# Patient Record
Sex: Male | Born: 1950
Health system: Southern US, Community
[De-identification: ages and names within clinical notes are randomized; demographics above are authoritative.]

## PROBLEM LIST (undated history)

## (undated) DIAGNOSIS — G20A1 Parkinson's disease without dyskinesia, without mention of fluctuations: Secondary | ICD-10-CM

## (undated) DIAGNOSIS — K862 Cyst of pancreas: Secondary | ICD-10-CM

## (undated) DIAGNOSIS — F429 Obsessive-compulsive disorder, unspecified: Secondary | ICD-10-CM

## (undated) DIAGNOSIS — I2510 Atherosclerotic heart disease of native coronary artery without angina pectoris: Secondary | ICD-10-CM

## (undated) DIAGNOSIS — E7800 Pure hypercholesterolemia, unspecified: Secondary | ICD-10-CM

## (undated) DIAGNOSIS — F419 Anxiety disorder, unspecified: Secondary | ICD-10-CM

## (undated) DIAGNOSIS — F32A Depression, unspecified: Secondary | ICD-10-CM

## (undated) DIAGNOSIS — K469 Unspecified abdominal hernia without obstruction or gangrene: Secondary | ICD-10-CM

## (undated) DIAGNOSIS — K219 Gastro-esophageal reflux disease without esophagitis: Secondary | ICD-10-CM

## (undated) DIAGNOSIS — I10 Essential (primary) hypertension: Secondary | ICD-10-CM

## (undated) DIAGNOSIS — M1990 Unspecified osteoarthritis, unspecified site: Secondary | ICD-10-CM

## (undated) HISTORY — DX: Depression, unspecified: F32.A

## (undated) HISTORY — DX: Essential (primary) hypertension: I10

## (undated) HISTORY — DX: Pure hypercholesterolemia, unspecified: E78.00

## (undated) HISTORY — DX: Anxiety disorder, unspecified: F41.9

## (undated) HISTORY — PX: HERNIA REPAIR: SHX51

## (undated) HISTORY — DX: Atherosclerotic heart disease of native coronary artery without angina pectoris: I25.10

## (undated) HISTORY — PX: COLONOSCOPY: SHX174

---

## 2003-05-28 ENCOUNTER — Emergency Department (HOSPITAL_COMMUNITY): Admission: EM | Admit: 2003-05-28 | Discharge: 2003-05-28 | Payer: Self-pay | Admitting: Emergency Medicine

## 2003-05-29 ENCOUNTER — Inpatient Hospital Stay (HOSPITAL_COMMUNITY): Admission: EM | Admit: 2003-05-29 | Discharge: 2003-05-31 | Payer: Self-pay | Admitting: Emergency Medicine

## 2003-05-30 ENCOUNTER — Encounter: Payer: Self-pay | Admitting: Family Medicine

## 2003-05-30 ENCOUNTER — Encounter: Payer: Self-pay | Admitting: Internal Medicine

## 2003-05-31 ENCOUNTER — Ambulatory Visit (HOSPITAL_COMMUNITY): Admission: RE | Admit: 2003-05-31 | Discharge: 2003-05-31 | Payer: Self-pay | Admitting: Family Medicine

## 2007-12-28 HISTORY — PX: OTHER SURGICAL HISTORY: SHX169

## 2010-02-05 ENCOUNTER — Encounter: Payer: Self-pay | Admitting: Gastroenterology

## 2010-02-13 ENCOUNTER — Ambulatory Visit: Payer: Self-pay | Admitting: Gastroenterology

## 2010-02-13 ENCOUNTER — Ambulatory Visit (HOSPITAL_COMMUNITY): Admission: RE | Admit: 2010-02-13 | Discharge: 2010-02-13 | Payer: Self-pay | Admitting: Gastroenterology

## 2010-03-23 DIAGNOSIS — I2510 Atherosclerotic heart disease of native coronary artery without angina pectoris: Secondary | ICD-10-CM | POA: Insufficient documentation

## 2010-03-23 DIAGNOSIS — I25119 Atherosclerotic heart disease of native coronary artery with unspecified angina pectoris: Secondary | ICD-10-CM | POA: Insufficient documentation

## 2011-01-26 NOTE — Letter (Signed)
Summary: Internal Other Domingo Dimes  Internal Other Domingo Dimes   Imported By: Cloria Spring LPN 57/84/6962 95:28:41  _____________________________________________________________________  External Attachment:    Type:   Image     Comment:   External Document

## 2011-05-14 NOTE — Consult Note (Signed)
Devin Daugherty, Devin Daugherty                            ACCOUNT NO.:  [id number]   MEDICAL RECORD NO.:  [id number]                   PATIENT TYPE:  EMS   LOCATION:  ED                                   FACILITY:  [facility]   PHYSICIAN:  Gracelyn Nurse, M.D.              DATE OF BIRTH:  1951/09/22   DATE OF CONSULTATION:  DATE OF DISCHARGE:                                   CONSULTATION   REASON FOR CONSULTATION:  Abdominal pain and depression.   HISTORY OF PRESENT ILLNESS:  This is a 60 year old white male with a history  of major depression and benzodiazepines addiction who was recently  discharged from [facility] after being detoxed off of  benzodiazepines for the second time.  His wife says he has been complaining  of one week history of epigastric pain.  She says he has had this pain  worked up before in the past very extensively but no organic cause was ever  found.  He has had no nausea or vomiting.  No diarrhea.  He says the pain  got much worse today.  His wife says he was active earlier today, actually  driving to [place] with her.  Coming back he complained about the pain  getting worse.  She found him later laying on couch and he would not speak  to her.  She called EMS.  When they got there he opened his eyes and spoke  with them however, with a very flat affect and spoke in kind of a whisper.  Here in the ER he is responsive but has a very flat affect and is not very  vocal.  He says he just wants the pain to go away.  He does not say he wants  to hurt himself.   PAST MEDICAL HISTORY:  1. Depression, anxiety.  2. Chronic epigastric pain.  3. Hypertension.  4. Status post hernia repair.   ALLERGIES:  No known drug allergies.   CURRENT MEDICATIONS:  1. Neurontin 300 mg three times a day.  2. Hydrochlorothiazide 25 mg daily.  3. Zocor 20 mg daily.  4. Benazepril hydrochlorothiazide 10 mg daily.  5. Zoloft 100 mg daily.  6. Trazodone 50 mg nightly.   SOCIAL  HISTORY:  He is married, has two children.  He does not smoke.  He  drinks alcohol occasionally.   FAMILY HISTORY:  He does not know his mother.  His father died during bypass  surgery for coronary artery disease.   REVIEW OF SYSTEMS:  As per HPI.  He has a very flat affect.  He has had  chronic epigastric pain.  Remainder of systems are negative.   PHYSICAL EXAMINATION:  VITAL SIGNS:  Temperature is 98, pulse 76,  respirations 22, blood pressure 159/97.  GENERAL:  This is a well nourished, white male in no acute distress.  HEENT:  Pupils equal, round, reactive to  light.  Extraocular movements  intact.  Oral mucosa is moist.  Oropharynx is clear.  CARDIOVASCULAR:  Regular, rate and rhythm.  No murmurs.  LUNGS:  Clear to auscultation.  ABDOMEN:  Soft and nondistended.  Bowel sounds are positive.  He complains  of tenderness in the epigastric area although I can not reproduce this on  exam.  EXTREMITIES:  No edema.  NEUROLOGIC:  Cranial nerves II-XII are grossly intact.  No focal deficits.  SKIN:  Moist with no rash.  PSYCHIATRIC:  He has a very flat affect.  He does respond to questions but  is not very focused and takes a while to get his words.   DIAGNOSTIC STUDIES:  EKG shows normal sinus rhythm.   LABORATORY VALUES:  White blood cells 10.5, hemoglobin is 14.8, platelets  320.  Sodium 127, potassium 2.9, chloride 93, CO2 27, BUN 12, creatinine  1.1, glucose 139.  Urine drug screen is positive for barbiturates.   ASSESSMENT/PLAN:  1. Depression/anxiety.  This is likely a driving force in his epigastric     pain as it has been in the past according to the wife.  The ACT team     evaluated him here in the ER and have arranged emergency psychiatric     followup in the morning.  I have advised them to keep that appointment.     We will continue his current medications as scheduled and let the     psychiatric team adjust those as needed.  He did have barbiturates     showing up in  his urine drug screen.  We questioned the family about     that. The wife says he was given some phenobarbital over at [facility]     to help him with his detox however, he is not currently on that.   1. Hypokalemia.  This is secondary to the hydrochlorothiazide.  We will     replete it with 40 mEq of potassium now.  Also start him on a daily dose     of 20 mEq and he is to followup with Dr. Gerda Diss on Monday to have this     rechecked.   1. Hyponatremia.  It is mild.  Sodium is 127.  This is likely secondary to     the hydrochlorothiazide also.   1. Insomnia.  He did complain of not being able to sleep.  He is on     Trazodone at a fairly low dose.  I did advise his wife that she could     give him an extra dose of this to help him sleep but she needs to follow     up with this with the psychiatrist also.   1. Hypertension.  We will continue his current medications.  It was a little     elevated tonight.  It was probably from his anxiety.   1. Constipation.  His wife did say that he has not had a bowel movement     lately and wanted to know if he could take some Milk of Magnesia.  I did     explain to her that would be safe for him to take with these other     medications.   DISPOSITION:  The patient will be discharged from the emergency room to  home.  His wife feels comfortable taking him home.  I did discuss this with  Dr. Lilyan Punt his primary care physician's partner that was on call and  they will be following him up next Monday to check his potassium and sooner  if necessary.  He is to return to the ER if symptoms worsen.  He is also to  followup with the [facility] in the morning this has been arranged  by the ACT team.  Patient is discharged in stable condition.                                                Gracelyn Nurse, M.D.    JDJ/MEDQ  D:  05/28/2003  T:  05/29/2003  Job:  [id number]   cc:   Donna Bernard, M.D.  [address]   [place]  [place] [address]  Fax: [phone]   Janetta Hora. Hulan Saas, M.D.  [address]  [place]  [place] [address]  Fax: [phone]

## 2011-05-14 NOTE — H&P (Signed)
Devin Daugherty, Devin Daugherty                            ACCOUNT NO.:  [id number]   MEDICAL RECORD NO.:  [id number]                   PATIENT TYPE:  EMS   LOCATION:  ED                                   FACILITY:  [facility]   PHYSICIAN:  Gracelyn Nurse, M.D.              DATE OF BIRTH:  01-21-1951   DATE OF ADMISSION:  05/29/2003  DATE OF DISCHARGE:                                HISTORY & PHYSICAL   CHIEF COMPLAINT:  Seizure.   HISTORY OF PRESENT ILLNESS:  This is a 60 year old white male who presents  after having two seizures.  His wife described them as tonic-clonic.  He was  shaking all over, his eyes were rolled back in his head, it lasted about 30  seconds.  He was not incontinent.  She says he awoke within a few minutes  after she was shaking him, and even after his first one he got up and  shaved, so it is evident he was not postictal for very long.  He does not  recall either event.  I was questioning whether or not this was actually a  seizure, however, here in the ER, he had another tonic-clonic seizure  witnessed.  He was also postictal after this one.  The seizure resolved on  its own within just a few seconds.  His heart rate was not greatly elevated.  His O2 saturation dropped temporarily.  I did see him last night as a  consult for the ER for epigastric pain and worsening depression.  Please see  that full previous note.  He was having some personality changes, symptoms  of worsening depression at that time according to his wife, however, on  reflection now, it could have been partially from a postictal state.  He did  follow up this morning at mental health as scheduled and was doing fine  until he did have the seizure this afternoon.  Also, when I saw him last  night, his wife had told me he had been out of [facility] for over a  week and has been off of his benzodiazepines, that he was detoxed off of.  However, tonight she tells me that they gave him around an  approximately  seven day supply for him to finish the taper, and he stopped that just a  couple of days ago.   PAST MEDICAL HISTORY:  1. Depression/anxiety.  2. Chronic epigastric pain.  3. Hypertension.  4. Status post hernia repair.  5. Hyponatremia.  6. Hypokalemia.   ALLERGIES:  No known drug allergies.   MEDICATIONS:  1. Neurontin 300 mg t.i.d.  2. Hydrochlorothiazide 20 mg daily.  3. Benazepril/HCTZ 10 mg daily.  4. Zoloft 100 mg daily.  5. Prazenol 50 mg q.h.s.   SOCIAL HISTORY:  He does not smoke.  He drinks alcohol occasionally.  He is  married with two children.   FAMILY HISTORY:  Father died during bypass surgery for coronary artery  disease.  His mother, he does not know.   REVIEW OF SYMPTOMS:  As per HPI.  He does suffer from the chronic epigastric  pain, the remainder of systems are negative.   PHYSICAL EXAMINATION:  VITAL SIGNS:  Temperature is 99.4, pulse 74,  respirations 18, blood pressure 132/79.  GENERAL:  This is a well-nourished white male in no acute distress.  HEENT:  Pupils equal, round, reactive to light.  Extraocular movements were  intact.  Oral mucosa moist.  Oropharynx clear.  CARDIOVASCULAR:  Regular rate and rhythm, no murmurs.  LUNGS:  Clear to auscultation.  ABDOMEN:  Soft, nontender, nondistended, bowel sounds positive.  EXTREMITIES:  No edema.  NEUROLOGIC:  Cranial nerves II-XII intact.  No focal deficits.  SKIN:  Moist with no rash.   LABORATORY DATA:  White blood cell count 14.6, hemoglobin 15.8, platelets  378.  Sodium 134, potassium 4.1, chloride 97, CO2 26, BUN 11, creatinine  1.3, glucose 112.  Total CK 279.   ASSESSMENT AND PLAN:  1. Seizure.  Benzodiazapine withdrawal versus a primary seizure disorder.     As stated above, he actually had several more doses of benzodiazepines     after discharge from [facility] last week, and just finished these a couple     of days ago.  This indeed could be a withdrawal seizure.  In fact,  that     is my first suspicion, however, cannot rule out a primary seizure     disorder that has been masked by his benzodiazepine use.  Since he has     had multiple seizures today, I am going to go ahead and load him with     Dilantin.  He has had a recent MRI at [facility], so I will get records of     that, and also we will order an EEG and a neurology consult.  2. Depression and anxiety.  We will continue him on his current medications.     He did seem to be in a much better mood today before he had the seizure.  3. Chronic epigastric pain.  We will treat him with Tylenol on a p.r.n.     basis.  From a consult note last night, this has been worked up as an     outpatient, and no organic source has been found.  4. Hypertension.  We will continue him on his current medications.  5. Hypokalemia and hyponatremia.  He had a low sodium and potassium last     night.  We repleted his potassium.  It is in a normal range today.  His     sodium is also within normal range today.                                               Gracelyn Nurse, M.D.    JDJ/MEDQ  D:  05/29/2003  T:  05/29/2003  Job:  [id number]

## 2011-05-14 NOTE — Procedures (Signed)
   NAME:  SETLIFFDarcel, Frane                         ACCOUNT NO.:  [id number]   MEDICAL RECORD NO.:  [id number]                   PATIENT TYPE:  OUT   LOCATION:  RESP                                 FACILITY:  [facility]   PHYSICIAN:  Kofi A. Gerilyn Pilgrim, M.D.              DATE OF BIRTH:  [birth year]   DATE OF PROCEDURE:  DATE OF DISCHARGE:  05/31/2003                                EEG INTERPRETATION   This was a 60 year old male who presented with three seizures in a day.  There is a history of benzodiazepine and alcohol overuse.   ANALYSIS:  A 16-channel recording was conducted for approximately 20  minutes.  There was a posterior rhythm of 10 hertz with attenuation with eye-  opening.  There is theta beta activity seen in frontal fields.  Awake and  drowsy architecture is seen.  Photic stimulation and hyperventilation do not  elicit any abnormal responses.  There is no focal slowing, generalized  slowing, or epileptiform or activity seen.   IMPRESSION:  This is a normal recording in the awake and drowsy state.                                               Kofi A. Gerilyn Pilgrim, M.D.    KAD/MEDQ  D:  06/01/2003  T:  06/02/2003  Job:  [id number]

## 2011-05-14 NOTE — Discharge Summary (Signed)
   Devin Daugherty, Devin Daugherty                         ACCOUNT NO.:  [id number]   MEDICAL RECORD NO.:  [id number]                  PATIENT TYPE:   LOCATION:                                       FACILITY:   PHYSICIAN:  Donna Bernard, M.D.             DATE OF BIRTH:   DATE OF ADMISSION:  DATE OF DISCHARGE:  05/31/2003                                 DISCHARGE SUMMARY   FINAL DIAGNOSES:  1. Seizure disorder.  2. History of ethanol abuse and chronic benzodiazepine use with recent     decline in usage - probable source of the patient's seizure episode.  3. Hypertension - stable.  4. History of depression and anxiety.   INITIAL HISTORY AND PHYSICAL:  Please see H&P as dictated.   HOSPITAL COURSE:  This patient is a 60 year old white male with a history of  alcohol abuse and benzodiazepine use and depression anxiety who presented to  the emergency room on the day of admission after two probable seizures.  Please see the H&P for further details.   Due to the crescendo nature of his presentation the patient was admitted to  the hospital.  He was given a load of IV Dilantin.  Of note the patient had  recently been discharged from [facility] and had taken a bit more  benzodiazepine than usual.  He also admits to significant alcohol abuse.  The patient was noted to have a low potassium, so, supplement was provided.   Neurology was consulted.  They felt the seizures were more than likely due  to withdrawal of alcohol and/or benzodiazepines.  Over the next several  days, the patient did relatively well.  He was seen by the ACT team with his  recent significant depression/anxiety and they felt that his mental health  could be managed as an outpatient.  On the day of discharge the patient was  stable and sent home on diagnoses and disposition as noted above.                                               Donna Bernard, M.D.    Karie Chimera  D:  07/14/2003  T:  07/14/2003  Job:  [id number]
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
## 2013-05-30 ENCOUNTER — Telehealth: Payer: Self-pay | Admitting: Family Medicine

## 2013-05-30 DIAGNOSIS — R202 Paresthesia of skin: Secondary | ICD-10-CM

## 2013-05-30 NOTE — Telephone Encounter (Signed)
Referral initiated to neurology. Notified of status.

## 2013-05-30 NOTE — Telephone Encounter (Signed)
Ok nurses plz order

## 2013-05-30 NOTE — Telephone Encounter (Signed)
Wife called to state that patient was seen 03/05/2013, at the visit was told that if the burning and tingling in his feet continued we could refer to neurology.  They are requesting referral to [facility], any day, any time, but as soon as possible.  Symptoms for 8 to 12 months and worse lately. If "ok" please initiate referral so I may proceed.  Call and notify pt or wife of status Hm# [phone], Cell# [phone]

## 2013-06-26 ENCOUNTER — Encounter: Payer: Self-pay | Admitting: Diagnostic Neuroimaging

## 2013-06-26 ENCOUNTER — Ambulatory Visit (INDEPENDENT_AMBULATORY_CARE_PROVIDER_SITE_OTHER): Payer: PRIVATE HEALTH INSURANCE | Admitting: Diagnostic Neuroimaging

## 2013-06-26 VITALS — BP 125/77 | HR 62 | Ht 69.0 in | Wt 187.0 lb

## 2013-06-26 DIAGNOSIS — F411 Generalized anxiety disorder: Secondary | ICD-10-CM

## 2013-06-26 DIAGNOSIS — G629 Polyneuropathy, unspecified: Secondary | ICD-10-CM

## 2013-06-26 DIAGNOSIS — I10 Essential (primary) hypertension: Secondary | ICD-10-CM | POA: Insufficient documentation

## 2013-06-26 DIAGNOSIS — F419 Anxiety disorder, unspecified: Secondary | ICD-10-CM | POA: Insufficient documentation

## 2013-06-26 DIAGNOSIS — E7800 Pure hypercholesterolemia, unspecified: Secondary | ICD-10-CM

## 2013-06-26 DIAGNOSIS — G609 Hereditary and idiopathic neuropathy, unspecified: Secondary | ICD-10-CM

## 2013-06-26 MED ORDER — GABAPENTIN 300 MG PO CAPS: 300.0000 mg | ORAL_CAPSULE | Freq: Three times a day (TID) | ORAL | Status: DC

## 2013-06-26 NOTE — Patient Instructions (Signed)
I will prescribe neuropathy/pain cream. Use 3-4 times per day.  I will prescribe gabapentin 300mg  at bedtime; increase up to 3 tabs per day as tolerated.

## 2013-06-26 NOTE — Progress Notes (Signed)
[facility]  PATIENT: Devin Daugherty DOB: Mar 03, 1951  REFERRING CLINICIAN: Gerda Diss HISTORY FROM: patient REASON FOR VISIT: new consult   HISTORICAL  CHIEF COMPLAINT:  Chief Complaint  Patient presents with  . Neurologic Problem    NP.Marland KitchenMarland KitchenMarland Kitchen#6    HISTORY OF PRESENT ILLNESS:   62 year old right-handed male with hypertension, hypercholesterolemia, anxiety, here for evaluation of burning feet for past 6 months. Patient describes burning tingling sensation in the heels of his feet, progressing anteriorly throughout the day. Symptoms are worse in the pain after he comes home from work. Patient works in Therapist, music, standing on concrete floors and welding. He denies any symptoms on the top of the seat, ankles knees or proximal legs. No symptoms in his fingers or hands. No neck or low back pain.  Patient previously drank alcohol, occasionally on the weekends. No alcohol the last 3 years.  REVIEW OF SYSTEMS: Full 14 system review of systems performed and notable only for anxiety.  ALLERGIES: No Known Allergies  HOME MEDICATIONS: No outpatient prescriptions prior to visit.   No facility-administered medications prior to visit.    PAST MEDICAL HISTORY: Past Medical History  Diagnosis Date  . Hypertension   . High cholesterol   . Anxiety     PAST SURGICAL HISTORY: Past Surgical History  Procedure Laterality Date  . Heart  stent  2009    FAMILY HISTORY: No family history on file.  SOCIAL HISTORY:  History   Social History  . Marital Status: Married    Spouse Name: N/A    Number of Children: 2  . Years of Education: 12   Occupational History  .  [employer]   Social History Main Topics  . Smoking status: Never Smoker   . Smokeless tobacco: Not on file  . Alcohol Use: No  . Drug Use: No  . Sexually Active: Not on file   Other Topics Concern  . Not on file   Social History Narrative  . No narrative on file     PHYSICAL  EXAM and Filed Vitals:   06/26/13 0825  BP: 125/77  Pulse: 62  Height: 5\' 9"  (1.753 m)  Weight: 187 lb (84.823 kg)    Not recorded    Body mass index is 27.6 kg/(m^2).  GENERAL EXAM: Patient is in no distress  CARDIOVASCULAR: Regular rate and rhythm, no murmurs, no carotid bruits  NEUROLOGIC: MENTAL STATUS: awake, alert, language fluent, comprehension intact, naming intact CRANIAL NERVE: no papilledema on fundoscopic exam, pupils equal and reactive to light, visual fields full to confrontation, extraocular muscles intact, no nystagmus, facial sensation and strength symmetric, uvula midline, shoulder shrug symmetric, tongue midline. MOTOR: normal bulk and tone, full strength in the BUE, BLE SENSORY: normal and symmetric to light touch, pinprick, temperature, vibration and proprioception COORDINATION: finger-nose-finger, fine finger movements normal REFLEXES: deep tendon reflexes present and symmetric GAIT/STATION: narrow based gait; able to walk on toes, heels and tandem; romberg is negative   DIAGNOSTIC DATA (LABS, IMAGING, TESTING) - I reviewed patient records, labs, notes, testing and imaging myself where available.  No results found for this basename: WBC, HGB, HCT, MCV, PLT   No results found for this basename: na, k, cl, co2, glucose, bun, creatinine, calcium, prot, albumin, ast, alt, alkphos, bilitot, gfrnonaa, gfraa   No results found for this basename: CHOL, HDL, LDLCALC, LDLDIRECT, TRIG, CHOLHDL   No results found for this basename: HGBA1C   No results found for this basename: VITAMINB12   No results  found for this basename: TSH      ASSESSMENT AND PLAN  62 y.o. year old male  has a past medical history of Hypertension; High cholesterol; and Anxiety. here with burning feet x 6 months. Exam is normal. May represent small fiber neuropathy.  PLAN: 1. Gabapentin + neuropathy cream 2. Ask PCP if TSH, b12, A1c were checked    Suanne Marker, MD  06/26/2013, 9:08 AM Certified in Neurology, Neurophysiology and Neuroimaging  [facility] [address], [address] [place], [place] [address] [phone]

## 2013-07-05 ENCOUNTER — Encounter: Payer: Self-pay | Admitting: Diagnostic Neuroimaging

## 2013-09-26 ENCOUNTER — Other Ambulatory Visit: Payer: Self-pay | Admitting: *Deleted

## 2013-09-26 ENCOUNTER — Ambulatory Visit: Payer: PRIVATE HEALTH INSURANCE | Admitting: Nurse Practitioner

## 2013-09-26 MED ORDER — LORAZEPAM 1 MG PO TABS: 1.0000 mg | ORAL_TABLET | Freq: Every day | ORAL | Status: DC

## 2013-10-08 ENCOUNTER — Telehealth: Payer: Self-pay | Admitting: Family Medicine

## 2013-10-08 DIAGNOSIS — Z125 Encounter for screening for malignant neoplasm of prostate: Secondary | ICD-10-CM

## 2013-10-08 DIAGNOSIS — E785 Hyperlipidemia, unspecified: Secondary | ICD-10-CM

## 2013-10-08 DIAGNOSIS — Z79899 Other long term (current) drug therapy: Secondary | ICD-10-CM

## 2013-10-08 NOTE — Telephone Encounter (Signed)
Patient has an appointment on 10/19/13. Does he need blood work drawn?

## 2013-10-08 NOTE — Telephone Encounter (Signed)
Notified patient blood work ordered and can report to [facility] to have it drawn.

## 2013-10-08 NOTE — Telephone Encounter (Signed)
Lip liv m7 psa 

## 2013-10-08 NOTE — Telephone Encounter (Signed)
Patient would like to have blood work completed prior to appointment on 10/19/2013.  If possible he would like to go this coming Saturday 10/13/2013.  Please call when blood work orders have been sent to [facility].  Thanks

## 2013-10-12 LAB — HEPATIC FUNCTION PANEL
AST: 27 U/L (ref 0–37)
Albumin: 4.1 g/dL (ref 3.5–5.2)
Indirect Bilirubin: 0.2 mg/dL (ref 0.0–0.9)
Total Bilirubin: 0.3 mg/dL (ref 0.3–1.2)
Total Protein: 6.8 g/dL (ref 6.0–8.3)

## 2013-10-12 LAB — BASIC METABOLIC PANEL
CO2: 26 mEq/L (ref 19–32)
Chloride: 108 mEq/L (ref 96–112)
Potassium: 4.6 mEq/L (ref 3.5–5.3)

## 2013-10-12 LAB — PSA: PSA: 0.93 ng/mL (ref <=4.00)

## 2013-10-12 LAB — LIPID PANEL
Cholesterol: 116 mg/dL (ref 0–200)
HDL: 31 mg/dL — ABNORMAL LOW (ref >39)
LDL Cholesterol: 54 mg/dL (ref 0–99)
Triglycerides: 156 mg/dL — ABNORMAL HIGH (ref <150)
VLDL: 31 mg/dL (ref 0–40)

## 2013-10-19 ENCOUNTER — Ambulatory Visit (INDEPENDENT_AMBULATORY_CARE_PROVIDER_SITE_OTHER): Payer: PRIVATE HEALTH INSURANCE | Admitting: Family Medicine

## 2013-10-19 ENCOUNTER — Encounter: Payer: Self-pay | Admitting: Family Medicine

## 2013-10-19 VITALS — BP 132/86 | Ht 69.0 in | Wt 189.4 lb

## 2013-10-19 DIAGNOSIS — G609 Hereditary and idiopathic neuropathy, unspecified: Secondary | ICD-10-CM

## 2013-10-19 DIAGNOSIS — I2510 Atherosclerotic heart disease of native coronary artery without angina pectoris: Secondary | ICD-10-CM

## 2013-10-19 DIAGNOSIS — I10 Essential (primary) hypertension: Secondary | ICD-10-CM

## 2013-10-19 DIAGNOSIS — G629 Polyneuropathy, unspecified: Secondary | ICD-10-CM

## 2013-10-19 DIAGNOSIS — G4700 Insomnia, unspecified: Secondary | ICD-10-CM

## 2013-10-19 MED ORDER — LORAZEPAM 1 MG PO TABS: 1.0000 mg | ORAL_TABLET | Freq: Every day | ORAL | Status: DC

## 2013-10-19 MED ORDER — ROSUVASTATIN CALCIUM 10 MG PO TABS: 10.0000 mg | ORAL_TABLET | Freq: Every day | ORAL | Status: DC

## 2013-10-19 MED ORDER — SERTRALINE HCL 50 MG PO TABS: 50.0000 mg | ORAL_TABLET | Freq: Every day | ORAL | Status: DC

## 2013-10-19 MED ORDER — ZOLPIDEM TARTRATE 10 MG PO TABS: 10.0000 mg | ORAL_TABLET | Freq: Every evening | ORAL | Status: DC | PRN

## 2013-10-19 MED ORDER — BENAZEPRIL HCL 5 MG PO TABS: 5.0000 mg | ORAL_TABLET | Freq: Every day | ORAL | Status: DC

## 2013-10-19 NOTE — Progress Notes (Signed)
  Subjective:    Patient ID: Devin Daugherty, male    DOB: [birth year], 62 y.o.   MRN: [id number]  HPI  Patient arrives for a follow up on blood pressure. BP cks at home numbers recently malfunctioning. Numbers prety close to good.   History hyperlipidemia. Claims compliance with medication. Trying to watch fats in his diet. No obvious side effects.  History of peripheral neuropathy. Somewhat discomforting to the patient in the evening time. The recently improved.  Patient has insomnia. States she definitely needs Ambien. It helps him.  Chronic depression anxiety. No suicidal thoughts. Decent appetite.  Known coronary artery disease. Patient falling with specialist with this. No particular symptoms at this time.  Cholesterol. Results for orders placed in visit on 10/08/13  LIPID PANEL      Result Value Range   Cholesterol 116  0 - 200 mg/dL   Triglycerides 440 (*) <150 mg/dL   HDL 31 (*) >34 mg/dL   Total CHOL/HDL Ratio 3.7     VLDL 31  0 - 40 mg/dL   LDL Cholesterol 54  0 - 99 mg/dL  HEPATIC FUNCTION PANEL      Result Value Range   Total Bilirubin 0.3  0.3 - 1.2 mg/dL   Bilirubin, Direct 0.1  0.0 - 0.3 mg/dL   Indirect Bilirubin 0.2  0.0 - 0.9 mg/dL   Alkaline Phosphatase 71  39 - 117 U/L   AST 27  0 - 37 U/L   ALT 25  0 - 53 U/L   Total Protein 6.8  6.0 - 8.3 g/dL   Albumin 4.1  3.5 - 5.2 g/dL  BASIC METABOLIC PANEL      Result Value Range   Sodium 136  135 - 145 mEq/L   Potassium 4.6  3.5 - 5.3 mEq/L   Chloride 108  96 - 112 mEq/L   CO2 26  19 - 32 mEq/L   Glucose, Bld 98  70 - 99 mg/dL   BUN 13  6 - 23 mg/dL   Creat 7.42  5.95 - 6.38 mg/dL   Calcium 9.1  8.4 - 75.6 mg/dL  PSA      Result Value Range   PSA 0.93  <=4.00 ng/mL     Walks sporadically. Some walking  So so   Review of Systems No chest pain no back pain no shortness of breath no abdominal pain no change about habits ROS otherwise negative    Objective:   Physical Exam  Alert no apparent  distress. Somewhat anxious appearing. HEENT normal. Vital stable. Lungs clear. Heart regular in rhythm.  Feet sensory exam diminished distally pulses good sensation good    Assessment & Plan:  Impression 1 hypertension good control. #2 hyperlipidemia discuss numbers in good control. #3 peripheral neuropathy discussed #4 coronary artery disease asymptomatic. #5 insomnia ongoing significant. #6 anxiety with element of depression clinically stable at this time. Plan maintain all medications. Diet exercise discussed in encourage. Medications refilled. Blood work discussed. 35 minutes spent most in discussion. WSL

## 2013-10-21 DIAGNOSIS — G4700 Insomnia, unspecified: Secondary | ICD-10-CM | POA: Insufficient documentation

## 2013-10-21 DIAGNOSIS — I2510 Atherosclerotic heart disease of native coronary artery without angina pectoris: Secondary | ICD-10-CM | POA: Insufficient documentation

## 2013-10-22 ENCOUNTER — Telehealth: Payer: Self-pay | Admitting: Family Medicine

## 2013-10-22 NOTE — Telephone Encounter (Signed)
Wife called this AM and states there is a problem with the follow medication.    benazepril (LOTENSIN) 5 MG tablet -- States he takes 10 mg of this medication daily.  (Therefore he will be short every month due to having to take two at time) LORazepam (ATIVAN) 1 MG tablet -- States this should be .5 mg twice a day.  (They are too small to cut in half)    Wants to know if our office can please correct these dosages?  [facility]  Please call patient to discuss this further  Patient was seen on Friday October 19, 2013

## 2013-10-22 NOTE — Telephone Encounter (Signed)
Left message to return call 

## 2013-10-22 NOTE — Telephone Encounter (Signed)
Nurse to call. They can cut current lorazam in half with pill cutter (two bucks). We will change further rx's in the future. Adjust benazepril dose to reflect going forward

## 2013-10-23 MED ORDER — BENAZEPRIL HCL 10 MG PO TABS: 10.0000 mg | ORAL_TABLET | Freq: Every day | ORAL | Status: DC

## 2013-10-23 MED ORDER — LORAZEPAM 0.5 MG PO TABS: 0.5000 mg | ORAL_TABLET | Freq: Three times a day (TID) | ORAL | Status: DC

## 2013-10-23 NOTE — Telephone Encounter (Signed)
Rxs corrected and sent electronically to pharmacy. Patient notified.

## 2013-10-29 ENCOUNTER — Other Ambulatory Visit: Payer: Self-pay | Admitting: *Deleted

## 2013-10-29 ENCOUNTER — Telehealth: Payer: Self-pay | Admitting: Family Medicine

## 2013-10-29 MED ORDER — ROSUVASTATIN CALCIUM 40 MG PO TABS: 40.0000 mg | ORAL_TABLET | Freq: Every day | ORAL | Status: DC

## 2013-10-29 NOTE — Telephone Encounter (Signed)
Patients wife says that patient picked up Crestor from pharmacy on Sunday and it was for 10 mg, but did not realize until he got home. Patient states he is supposed to be on 40 mg. Also, patient wife wants to know if the pharmacy will take back the 10 mg because it is an expensive medication?   [facility]

## 2013-10-29 NOTE — Telephone Encounter (Signed)
Discussed with pt's wife. Med list changed to 40mg  and a 90 dy supply sent to pharm with one refill.

## 2013-11-01 ENCOUNTER — Other Ambulatory Visit: Payer: Self-pay

## 2013-11-12 ENCOUNTER — Telehealth: Payer: Self-pay | Admitting: Family Medicine

## 2013-11-12 NOTE — Telephone Encounter (Signed)
Devin Daugherty, this family often compromised with communication skills, last concern was about the full vs half. Now its about tid instead of bid. Pleade call and clarify exactly want they want.

## 2013-11-12 NOTE — Telephone Encounter (Signed)
Left message on voicemail to return call.

## 2013-11-12 NOTE — Telephone Encounter (Signed)
See telephone message on 10/22/13. Was documented and confirmed that this med was taken BID.

## 2013-11-12 NOTE — Telephone Encounter (Signed)
LORazepam (ATIVAN) 0.5 MG tablet  Pt is supposed to take this med TID at 0.5mg    The script sent in is incorrect and not enough pills issued  Please correct and resubmit

## 2013-11-13 MED ORDER — LORAZEPAM 0.5 MG PO TABS: 0.5000 mg | ORAL_TABLET | Freq: Two times a day (BID) | ORAL | Status: DC | PRN

## 2013-11-13 NOTE — Telephone Encounter (Signed)
Ok numb 60 .5mg  one bid thirty d

## 2013-11-13 NOTE — Telephone Encounter (Signed)
Script printed and faxed to [facility]. Left message on voicemail notifying patient.

## 2013-11-13 NOTE — Telephone Encounter (Signed)
Patient's wife stated that patient takes lorazepam 0.5 mg BID. The last prescription that was sent in was written for #30. She states that patient needs #60 to last him through the month.

## 2013-11-13 NOTE — Telephone Encounter (Signed)
Plus four monthly ref

## 2014-03-08 ENCOUNTER — Telehealth: Payer: Self-pay | Admitting: Family Medicine

## 2014-03-08 MED ORDER — PREDNISONE 20 MG PO TABS: ORAL_TABLET | ORAL | Status: DC

## 2014-03-08 NOTE — Telephone Encounter (Signed)
pred 20 three qd three d two qd for three d one qd for two d

## 2014-03-08 NOTE — Telephone Encounter (Signed)
Medication sent to pharmacy. Wife was notified.  

## 2014-03-08 NOTE — Telephone Encounter (Signed)
Patients gout on his big toe has flared up and his wife is hoping we can call something in for him. She said he has already been seen for this issue once before.    [facility]

## 2014-03-18 ENCOUNTER — Other Ambulatory Visit: Payer: Self-pay | Admitting: *Deleted

## 2014-03-18 MED ORDER — BENAZEPRIL HCL 10 MG PO TABS
10.0000 mg | ORAL_TABLET | Freq: Every day | ORAL | Status: DC
Start: 2014-03-18 — End: 2014-05-15

## 2014-05-15 ENCOUNTER — Telehealth: Payer: Self-pay | Admitting: Family Medicine

## 2014-05-15 MED ORDER — BENAZEPRIL HCL 10 MG PO TABS: 10.0000 mg | ORAL_TABLET | Freq: Every day | ORAL | Status: DC

## 2014-05-15 MED ORDER — ZOLPIDEM TARTRATE 10 MG PO TABS: 10.0000 mg | ORAL_TABLET | Freq: Every evening | ORAL | Status: DC | PRN

## 2014-05-15 MED ORDER — LORAZEPAM 0.5 MG PO TABS: 0.5000 mg | ORAL_TABLET | Freq: Two times a day (BID) | ORAL | Status: DC | PRN

## 2014-05-15 MED ORDER — SERTRALINE HCL 50 MG PO TABS: 50.0000 mg | ORAL_TABLET | Freq: Every day | ORAL | Status: DC

## 2014-05-15 MED ORDER — ROSUVASTATIN CALCIUM 40 MG PO TABS: 40.0000 mg | ORAL_TABLET | Freq: Every day | ORAL | Status: DC

## 2014-05-15 NOTE — Telephone Encounter (Signed)
pts spouse states he is currently working out of town an about to run out  Of his meds. He wants to know if we can go ahead an refill all his meds x1 so he can make an appt when he gets back   [facility]  Last seen 10/14

## 2014-05-15 NOTE — Telephone Encounter (Signed)
One mo only 

## 2014-05-15 NOTE — Telephone Encounter (Signed)
Last seen 10/19/13. Pt requesting refill on lorazepam, ambien, zoloft, crestor, and benazepril.

## 2014-05-15 NOTE — Telephone Encounter (Signed)
Left message on voicemail notifying patient that refills will be sent to pharmacy today.

## 2014-05-28 ENCOUNTER — Telehealth: Payer: Self-pay | Admitting: Family Medicine

## 2014-05-28 DIAGNOSIS — Z79899 Other long term (current) drug therapy: Secondary | ICD-10-CM

## 2014-05-28 DIAGNOSIS — E785 Hyperlipidemia, unspecified: Secondary | ICD-10-CM

## 2014-05-28 NOTE — Telephone Encounter (Signed)
10/12/13 had lipid, liver, met 7, psa

## 2014-05-28 NOTE — Telephone Encounter (Signed)
Lip liv AND appt, needs to do both,

## 2014-05-28 NOTE — Telephone Encounter (Signed)
Patient needs order for blood work to have his meds refilled. I asked if she wanted to go ahead and set up the followup up appointment and she said no, they will wait.

## 2014-05-28 NOTE — Telephone Encounter (Signed)
Notified patient that bloodwork has been ordered. Patient states he will call back to schedule his appt when he gets his work schedule.

## 2014-06-08 LAB — HEPATIC FUNCTION PANEL
ALBUMIN: 4.1 g/dL (ref 3.5–5.2)
ALT: 22 U/L (ref 0–53)
AST: 24 U/L (ref 0–37)
Alkaline Phosphatase: 67 U/L (ref 39–117)
Bilirubin, Direct: 0.1 mg/dL (ref 0.0–0.3)
Indirect Bilirubin: 0.4 mg/dL (ref 0.2–1.2)
TOTAL PROTEIN: 7.1 g/dL (ref 6.0–8.3)
Total Bilirubin: 0.5 mg/dL (ref 0.2–1.2)

## 2014-06-08 LAB — LIPID PANEL
Cholesterol: 138 mg/dL (ref 0–200)
HDL: 39 mg/dL — ABNORMAL LOW (ref >39)
LDL CALC: 73 mg/dL (ref 0–99)
Total CHOL/HDL Ratio: 3.5 Ratio
Triglycerides: 128 mg/dL (ref <150)
VLDL: 26 mg/dL (ref 0–40)

## 2014-06-21 ENCOUNTER — Ambulatory Visit (INDEPENDENT_AMBULATORY_CARE_PROVIDER_SITE_OTHER): Payer: PRIVATE HEALTH INSURANCE | Admitting: Family Medicine

## 2014-06-21 ENCOUNTER — Encounter: Payer: Self-pay | Admitting: Family Medicine

## 2014-06-21 VITALS — BP 112/80 | Ht 68.0 in | Wt 193.0 lb

## 2014-06-21 DIAGNOSIS — I2510 Atherosclerotic heart disease of native coronary artery without angina pectoris: Secondary | ICD-10-CM

## 2014-06-21 DIAGNOSIS — I10 Essential (primary) hypertension: Secondary | ICD-10-CM

## 2014-06-21 DIAGNOSIS — I2584 Coronary atherosclerosis due to calcified coronary lesion: Secondary | ICD-10-CM

## 2014-06-21 DIAGNOSIS — G609 Hereditary and idiopathic neuropathy, unspecified: Secondary | ICD-10-CM

## 2014-06-21 DIAGNOSIS — G4700 Insomnia, unspecified: Secondary | ICD-10-CM

## 2014-06-21 DIAGNOSIS — E7800 Pure hypercholesterolemia, unspecified: Secondary | ICD-10-CM

## 2014-06-21 DIAGNOSIS — G629 Polyneuropathy, unspecified: Secondary | ICD-10-CM

## 2014-06-21 MED ORDER — ZOLPIDEM TARTRATE 10 MG PO TABS: 10.0000 mg | ORAL_TABLET | Freq: Every evening | ORAL | Status: DC | PRN

## 2014-06-21 MED ORDER — LORAZEPAM 0.5 MG PO TABS: 0.5000 mg | ORAL_TABLET | Freq: Two times a day (BID) | ORAL | Status: DC | PRN

## 2014-06-21 MED ORDER — ROSUVASTATIN CALCIUM 40 MG PO TABS: 40.0000 mg | ORAL_TABLET | Freq: Every day | ORAL | Status: DC

## 2014-06-21 MED ORDER — SERTRALINE HCL 50 MG PO TABS: 50.0000 mg | ORAL_TABLET | Freq: Every day | ORAL | Status: DC

## 2014-06-21 MED ORDER — BENAZEPRIL HCL 10 MG PO TABS: 10.0000 mg | ORAL_TABLET | Freq: Every day | ORAL | Status: DC

## 2014-06-21 NOTE — Progress Notes (Signed)
   Subjective:    Patient ID: Devin Daugherty, male    DOB: 20-Sep-1951, 63 y.o.   MRN: [id number]  Hypertension This is a chronic problem.  Walks as much as he can. Takes meds every day. No problems with meds. Eats healthy diet.  Needs refill on all meds. Would like meds printed to carry with him.  Patient states no concerns today.   Not exercising as much as he'd like  Eats well, good doet  Results for orders placed in visit on 05/28/14  LIPID PANEL      Result Value Ref Range   Cholesterol 138  0 - 200 mg/dL   Triglycerides 409128  <811<150 mg/dL   HDL 39 (*) >91>39 mg/dL   Total CHOL/HDL Ratio 3.5     VLDL 26  0 - 40 mg/dL   LDL Cholesterol 73  0 - 99 mg/dL  HEPATIC FUNCTION PANEL      Result Value Ref Range   Total Bilirubin 0.5  0.2 - 1.2 mg/dL   Bilirubin, Direct 0.1  0.0 - 0.3 mg/dL   Indirect Bilirubin 0.4  0.2 - 1.2 mg/dL   Alkaline Phosphatase 67  39 - 117 U/L   AST 24  0 - 37 U/L   ALT 22  0 - 53 U/L   Total Protein 7.1  6.0 - 8.3 g/dL   Albumin 4.1  3.5 - 5.2 g/dL   Staying busy with work  Ongoing challenges with insomnia. Claims the medication definitely helps. No morning residual drowsiness.  Compliant with cholesterol medication. No obvious side effects. Trying to cut down fats in his diet. Review of Systems No headache no chest pain no back pain no abdominal pain no change about habits no blood in stool ROS otherwise negative    Objective:   Physical Exam  Alert no apparent distress. Vitals stable. HEENT normal. Lungs clear. Heart rare rhythm. Ankles edema.      Assessment & Plan:  Pressure 1 hypertension good control discussed. #2 hyperlipidemia control also good discussed #3 insomnia on: Discussed plan diet exercise discussed maintain same meds. Recheck in 6 months. WSL

## 2014-07-25 DIAGNOSIS — Z955 Presence of coronary angioplasty implant and graft: Secondary | ICD-10-CM | POA: Insufficient documentation

## 2014-08-09 ENCOUNTER — Encounter: Payer: Self-pay | Admitting: Family Medicine

## 2014-08-09 ENCOUNTER — Ambulatory Visit (INDEPENDENT_AMBULATORY_CARE_PROVIDER_SITE_OTHER): Payer: PRIVATE HEALTH INSURANCE | Admitting: Family Medicine

## 2014-08-09 VITALS — BP 162/90 | Ht 68.0 in | Wt 190.0 lb

## 2014-08-09 DIAGNOSIS — M25559 Pain in unspecified hip: Secondary | ICD-10-CM

## 2014-08-09 DIAGNOSIS — S76219A Strain of adductor muscle, fascia and tendon of unspecified thigh, initial encounter: Secondary | ICD-10-CM

## 2014-08-09 MED ORDER — TRAMADOL HCL 50 MG PO TABS: 50.0000 mg | ORAL_TABLET | Freq: Four times a day (QID) | ORAL | Status: DC | PRN

## 2014-08-09 MED ORDER — SERTRALINE HCL 100 MG PO TABS: 100.0000 mg | ORAL_TABLET | Freq: Every day | ORAL | Status: DC

## 2014-08-09 NOTE — Progress Notes (Signed)
   Subjective:    Patient ID: Devin Daugherty, male    DOB: 01-Jan-1951, 63 y.o.   MRN: [id number]  HPI Comments: Does heavy lifting at work  Groin Pain The patient's primary symptoms include pelvic pain and testicular pain. This is a new problem. Episode onset: June. The problem occurs daily. The pain is medium. There is no reported injury. The problem affects both sides. The testicular pain affects the left testicle. The color of the testicles is normal. The symptoms are aggravated by heavy lifting. Treatments tried: IBU. The treatment provided mild relief.   Working at [employer], went to [employer]. Heart enzyme elevated, stress ECHO looked good, released him  Now with groin pain after heavy lifting, felt pulling in the groin with swelling Notice with sitting No change with urination or BM Patient did spend 48 hours in the hospital recently to rule out MI. Patient states it is very stressful on him in his head him worried ever sent to  Review of Systems  Genitourinary: Positive for testicular pain and pelvic pain.       Objective:   Physical Exam Lungs are clear respiratory rate normal heart was regular pulse normal abdomen is soft no guarding or rebound groin region there is no hernia felt. Patient states that he feels swollen in the suprapubic region. It is hard for me to appreciate it. A thorough examination was done of that area. Testicular exam was normal.   Prostate normal exam today    Assessment & Plan:  Groin strain-I find no evidence of intra-abdominal or prostate pathology urine looks good tramadol as needed for discomfort Aleve for the next few days if ongoing is followup for recheck with Devin Daugherty  Anxiety issues patient recently in the hospital had a rule out heart disease workup very stressed about all of this experience increase Zoloft use 100 mg daily.  Followup in the office later this fall sooner if problems

## 2014-09-16 ENCOUNTER — Encounter: Payer: Self-pay | Admitting: Family Medicine

## 2014-09-16 ENCOUNTER — Ambulatory Visit (INDEPENDENT_AMBULATORY_CARE_PROVIDER_SITE_OTHER): Payer: PRIVATE HEALTH INSURANCE | Admitting: Family Medicine

## 2014-09-16 VITALS — BP 146/88 | Ht 68.0 in | Wt 198.0 lb

## 2014-09-16 DIAGNOSIS — M546 Pain in thoracic spine: Secondary | ICD-10-CM

## 2014-09-16 MED ORDER — CHLORZOXAZONE 500 MG PO TABS: 500.0000 mg | ORAL_TABLET | Freq: Three times a day (TID) | ORAL | Status: DC

## 2014-09-16 MED ORDER — PREDNISONE 10 MG PO TABS: ORAL_TABLET | ORAL | Status: DC

## 2014-09-16 NOTE — Progress Notes (Signed)
   Subjective:    Patient ID: Devin Daugherty, male    DOB: Feb 08, 1951, 63 y.o.   MRN: [id number]  HPI Patient arrives to follow up on groin strain- was seen by Dr. Lorin Picket a few weeks ago. Patient does a lot of heavy lifting at job. Patient states he is also having pain in hip-sciatic.  Patient is experiencing 2 distinct pains. 1 in the lower groin left greater than right. Feels swollen at times. No distinct bulging.  Left flank pain worse with certain motions sharp in nature. Review of Systems    no headache no chest pain no back pain no abdominal pain no change about the blood in stool Objective:   Physical Exam  Alert no apparent distress. Lungs clear heart rare rhythm groin no palpable hernia some inguinal region tenderness actually both sides. Negative spinal tenderness negative straight leg raise some left lateral pain to deep palpation      Assessment & Plan:  Impression likely inguinal strain along with lumbar strain discussed plan prednisone taper. An eye specimen is prescribed. Symptomatic care discussed. WSL

## 2014-09-30 ENCOUNTER — Telehealth: Payer: Self-pay | Admitting: Family Medicine

## 2014-09-30 DIAGNOSIS — R1030 Lower abdominal pain, unspecified: Secondary | ICD-10-CM

## 2014-09-30 NOTE — Telephone Encounter (Signed)
i think pt just had groin strain, but per his request general  surg referral for poss inguinal hernia

## 2014-09-30 NOTE — Telephone Encounter (Signed)
Referral ordered in Epic. Patient notified. 

## 2014-09-30 NOTE — Telephone Encounter (Signed)
pts spouse calling to say that his groin pain is not corrected an will need to  Go forward with the specialist at this point as u suggested.   Please call pt when you have this ready

## 2014-10-04 ENCOUNTER — Telehealth: Payer: Self-pay | Admitting: *Deleted

## 2014-10-04 NOTE — Telephone Encounter (Signed)
Pt's wife called stating they have been waiting 2 months on a referral for a specialist, per wife they have not heard anything. Please advise!

## 2014-10-07 NOTE — Telephone Encounter (Signed)
Appt scheduled, mailed letter & called to notify pt

## 2014-10-08 ENCOUNTER — Other Ambulatory Visit: Payer: Self-pay | Admitting: *Deleted

## 2014-10-08 ENCOUNTER — Telehealth: Payer: Self-pay | Admitting: Family Medicine

## 2014-10-08 MED ORDER — TRAMADOL HCL 50 MG PO TABS: 50.0000 mg | ORAL_TABLET | Freq: Four times a day (QID) | ORAL | Status: DC | PRN

## 2014-10-08 NOTE — Telephone Encounter (Signed)
Tramadol 50 numb thirty one q 6 hrs prn

## 2014-10-08 NOTE — Telephone Encounter (Signed)
Pt wants to know if he can get some pain meds to help him get through till  He sees the specialist in about 2 weeks for his hip pain?   He would like to have some more tramadol as was given in August for this  [facility]

## 2014-10-08 NOTE — Telephone Encounter (Signed)
Med sent to pharm. Pt notified.  

## 2014-10-29 ENCOUNTER — Other Ambulatory Visit (INDEPENDENT_AMBULATORY_CARE_PROVIDER_SITE_OTHER): Payer: Self-pay

## 2014-10-29 DIAGNOSIS — R1032 Left lower quadrant pain: Secondary | ICD-10-CM

## 2014-10-29 DIAGNOSIS — K4090 Unilateral inguinal hernia, without obstruction or gangrene, not specified as recurrent: Secondary | ICD-10-CM

## 2014-10-31 ENCOUNTER — Ambulatory Visit
Admission: RE | Admit: 2014-10-31 | Discharge: 2014-10-31 | Disposition: A | Discharge status: Home / Self Care | Payer: PRIVATE HEALTH INSURANCE | Source: Ambulatory Visit | Attending: General Surgery | Admitting: General Surgery

## 2014-10-31 MED ORDER — IOHEXOL 300 MG/ML  SOLN
125.0000 mL | Freq: Once | INTRAMUSCULAR | Status: AC | PRN
Start: 2014-10-31 — End: 2014-10-31
  Administered 2014-10-31: 125 mL via INTRAVENOUS

## 2014-11-06 ENCOUNTER — Encounter: Payer: Self-pay | Admitting: Family Medicine

## 2014-11-07 ENCOUNTER — Encounter: Payer: Self-pay | Admitting: Family Medicine

## 2014-11-07 ENCOUNTER — Telehealth: Payer: Self-pay | Admitting: Family Medicine

## 2014-11-07 ENCOUNTER — Ambulatory Visit (INDEPENDENT_AMBULATORY_CARE_PROVIDER_SITE_OTHER): Payer: PRIVATE HEALTH INSURANCE | Admitting: Family Medicine

## 2014-11-07 VITALS — BP 136/82 | Ht 68.0 in | Wt 192.2 lb

## 2014-11-07 DIAGNOSIS — N433 Hydrocele, unspecified: Secondary | ICD-10-CM

## 2014-11-07 MED ORDER — TRAMADOL HCL 50 MG PO TABS: 50.0000 mg | ORAL_TABLET | Freq: Four times a day (QID) | ORAL | Status: DC | PRN

## 2014-11-07 NOTE — Telephone Encounter (Signed)
From Rosamaria LintsMichael Matsumoto through MyChart:   I have had a ct scan done and the results were that I had no type hernia, what course should I take now to find the problem.Through you or Dr Derrell LollingIngram. Thank You Kathlene NovemberMike

## 2014-11-07 NOTE — Telephone Encounter (Signed)
Patient was instructed to schedule a follow up OV for muscle strain next week, but he starts a new job next week and he does not want to take off for next week.  He is wanting to know if there is any way that we can work him in today or tomorrow(with a different doc since Dr. Brett CanalesSteve is off tomorrow)? Please advise.

## 2014-11-07 NOTE — Telephone Encounter (Signed)
Appointment scheduled.

## 2014-11-07 NOTE — Progress Notes (Signed)
   Subjective:    Patient ID: Devin Daugherty, male    DOB: 1951/06/03, 63 y.o.   MRN: [id number]  HPI  Patient arrives with continued problems with groin pain. Patient states he saw a specialist and had a CT scan but they found no evidence of hernia.   Patient arrives office for very protracted discussion. He continues to have diffuse groin pain. Symmetric in nature. Aching at times. Next perhaps deftly worsens with heavy lifting or movement. Diffuse through the groin and scrotum inguinal region and seemingly deeper.  Patient saw a general surgeon in consultation. There workup refilled no evidence of inguinal herniorrhaphy a scan. However bilateral mild hydrocele was noted. Patient notes no history of this.  Patient reports frustration about ongoing discomfort and would like to do any testsor referrals possible to try to figure this out [phone] Review of Systems No chest pain no headache no back pain no change in bowel habits no blood in stool ROS otherwise negative    Objective:   Physical Exam  Alert anxious appearing. Lungs clear. Heart regular in rhythm. Abdomen benign. Testicles no palpable abnormality. Inguinal region no adenopathy no noticeable hernia     Assessment & Plan:  Impression groin strain-like symptoms persistent very aggravating to patient now with hydrocele evident on scan discussed at length plan urology referral. Pain medication when necessary. If urologist feel no further workup needed this patient likely has chronic inguinal strain that he will have to treat symptomatically WSL

## 2014-11-10 DIAGNOSIS — N433 Hydrocele, unspecified: Secondary | ICD-10-CM | POA: Insufficient documentation

## 2014-11-10 NOTE — Addendum Note (Signed)
Addended by: Donna BernardLUKING, STEPHEN W on: 11/10/2014 10:40 AM   Modules accepted: Orders

## 2014-11-14 ENCOUNTER — Encounter: Payer: Self-pay | Admitting: Family Medicine

## 2014-11-29 ENCOUNTER — Telehealth: Payer: Self-pay | Admitting: Family Medicine

## 2014-11-29 MED ORDER — LORAZEPAM 1 MG PO TABS: ORAL_TABLET | ORAL | Status: DC

## 2014-11-29 NOTE — Telephone Encounter (Signed)
Left message on voicemail notifying patient that new script will be faxed to pharmacy by the end of the day.

## 2014-11-29 NOTE — Telephone Encounter (Signed)
incr to one mg number 60 one half to one bid

## 2014-11-29 NOTE — Telephone Encounter (Signed)
Patient recently got fired from his job and lost his insurance.  He says that he is having really bad anxiety and wants to know if his ativan can be increased?  He said he is facing the most anxiety in the morning. Please advise.  [facility]

## 2014-12-06 ENCOUNTER — Other Ambulatory Visit: Payer: Self-pay | Admitting: *Deleted

## 2014-12-06 DIAGNOSIS — I10 Essential (primary) hypertension: Secondary | ICD-10-CM

## 2014-12-06 DIAGNOSIS — R5383 Other fatigue: Secondary | ICD-10-CM

## 2014-12-06 DIAGNOSIS — E785 Hyperlipidemia, unspecified: Secondary | ICD-10-CM

## 2014-12-06 DIAGNOSIS — Z79899 Other long term (current) drug therapy: Secondary | ICD-10-CM

## 2014-12-06 DIAGNOSIS — Z125 Encounter for screening for malignant neoplasm of prostate: Secondary | ICD-10-CM

## 2014-12-06 LAB — HEPATIC FUNCTION PANEL
ALBUMIN: 4.2 g/dL (ref 3.5–5.2)
ALT: 25 U/L (ref 0–53)
AST: 22 U/L (ref 0–37)
Alkaline Phosphatase: 68 U/L (ref 39–117)
Bilirubin, Direct: 0.1 mg/dL (ref 0.0–0.3)
Indirect Bilirubin: 0.3 mg/dL (ref 0.2–1.2)
TOTAL PROTEIN: 6.9 g/dL (ref 6.0–8.3)
Total Bilirubin: 0.4 mg/dL (ref 0.2–1.2)

## 2014-12-06 LAB — BASIC METABOLIC PANEL
BUN: 13 mg/dL (ref 6–23)
CHLORIDE: 105 meq/L (ref 96–112)
CO2: 25 meq/L (ref 19–32)
Calcium: 9.2 mg/dL (ref 8.4–10.5)
Creat: 1.15 mg/dL (ref 0.50–1.35)
Glucose, Bld: 106 mg/dL — ABNORMAL HIGH (ref 70–99)
POTASSIUM: 4.6 meq/L (ref 3.5–5.3)
SODIUM: 139 meq/L (ref 135–145)

## 2014-12-06 LAB — LIPID PANEL
Cholesterol: 155 mg/dL (ref 0–200)
HDL: 44 mg/dL (ref >39)
LDL CALC: 74 mg/dL (ref 0–99)
TRIGLYCERIDES: 187 mg/dL — AB (ref <150)
Total CHOL/HDL Ratio: 3.5 Ratio
VLDL: 37 mg/dL (ref 0–40)

## 2014-12-06 LAB — TSH: TSH: 1.184 u[IU]/mL (ref 0.350–4.500)

## 2014-12-07 LAB — PSA: PSA: 0.92 ng/mL (ref <=4.00)

## 2014-12-13 ENCOUNTER — Ambulatory Visit (INDEPENDENT_AMBULATORY_CARE_PROVIDER_SITE_OTHER): Payer: Medicare Other | Admitting: Family Medicine

## 2014-12-13 ENCOUNTER — Encounter: Payer: Self-pay | Admitting: Family Medicine

## 2014-12-13 VITALS — BP 142/90 | Ht 67.0 in | Wt 185.0 lb

## 2014-12-13 DIAGNOSIS — G4700 Insomnia, unspecified: Secondary | ICD-10-CM

## 2014-12-13 DIAGNOSIS — E7800 Pure hypercholesterolemia, unspecified: Secondary | ICD-10-CM

## 2014-12-13 DIAGNOSIS — R7309 Other abnormal glucose: Secondary | ICD-10-CM | POA: Diagnosis not present

## 2014-12-13 DIAGNOSIS — I10 Essential (primary) hypertension: Secondary | ICD-10-CM

## 2014-12-13 DIAGNOSIS — Z0000 Encounter for general adult medical examination without abnormal findings: Secondary | ICD-10-CM

## 2014-12-13 DIAGNOSIS — R739 Hyperglycemia, unspecified: Secondary | ICD-10-CM

## 2014-12-13 LAB — POCT GLYCOSYLATED HEMOGLOBIN (HGB A1C): HEMOGLOBIN A1C: 5.8

## 2014-12-13 MED ORDER — LORAZEPAM 1 MG PO TABS: ORAL_TABLET | ORAL | Status: DC

## 2014-12-13 MED ORDER — BENAZEPRIL HCL 10 MG PO TABS: 10.0000 mg | ORAL_TABLET | Freq: Every day | ORAL | Status: DC

## 2014-12-13 MED ORDER — ZOLPIDEM TARTRATE 10 MG PO TABS: 10.0000 mg | ORAL_TABLET | Freq: Every evening | ORAL | Status: DC | PRN

## 2014-12-13 MED ORDER — SERTRALINE HCL 100 MG PO TABS: ORAL_TABLET | ORAL | Status: DC

## 2014-12-13 MED ORDER — ROSUVASTATIN CALCIUM 40 MG PO TABS: 40.0000 mg | ORAL_TABLET | Freq: Every day | ORAL | Status: DC

## 2014-12-13 NOTE — Progress Notes (Signed)
   Subjective:    Patient ID: Devin Daugherty, male    DOB: [birth year], 63 y.o.   MRN: [id number]  Concerns about anxiety. Takes ativan. Not helping like it use to.  Had bloodwork done on 12/11. Blood sugar was 106. Note on bloodwork stated to do A1C at office visit. A1C today was 5.8. HPI AWV- Annual Wellness Visit  The patient was seen for their annual wellness visit. The patient's past medical history, surgical history, and family history were reviewed. Pertinent vaccines were reviewed ( tetanus, pneumonia, shingles, flu) The patient's medication list was reviewed and updated.  The height and weight were entered. The patient's current BMI is:28.97  Cognitive screening was completed. Outcome of Mini - Cog: pass   Falls within the past 6 months: none  Current tobacco usage: none (All patients who use tobacco were given written and verbal information on quitting)  Recent listing of emergency department/hospitalizations over the past year were reviewed.  current specialist the patient sees on a regular basis: cardiologist once a year.    Medicare annual wellness visit patient questionnaire was reviewed.  A written screening schedule for the patient for the next 5-10 years was given. Appropriate discussion of followup regarding next visit was discussed.  Still having sig nerves and trouble sleeping  Claims compliance with blood pressure medication. No obvious side effects. Watching salt intake.  Sugar was elevated on the blood work. Was advised to get an A1c.  Recently was fired from job. Experiencing tremendous stress. II nerve pills a day not enough per patient. bw was tru; fasting  Results for orders placed or performed in visit on 12/13/14  POCT glycosylated hemoglobin (Hb A1C)  Result Value Ref Range   Hemoglobin A1C 5.8       Review of Systems No headache no chest pain no back pain no abdominal pain no change in bowel habits blood in stool    Objective:   Physical Exam Alert no acute distress H&T normal. Lungs clear heart regular in rhythm. Anxious appearing. Ankles without edema.       Assessment & Plan:  Impression 1 hypertension good control #2 hyperlipidemia good control #3 impaired fasting glucose discussed #4 depression anxiety. Partly situational. Plan increase Zoloft 150 daily. Increase lorazepam to 3 times a day. Exercise encourage. Maintain other medications. Delayed physical for one month. WSL

## 2015-01-03 ENCOUNTER — Encounter: Payer: Self-pay | Admitting: Family Medicine

## 2015-01-03 ENCOUNTER — Ambulatory Visit (INDEPENDENT_AMBULATORY_CARE_PROVIDER_SITE_OTHER): Payer: Medicare Other | Admitting: Family Medicine

## 2015-01-03 VITALS — BP 124/80 | Ht 67.0 in | Wt 186.4 lb

## 2015-01-03 DIAGNOSIS — R251 Tremor, unspecified: Secondary | ICD-10-CM

## 2015-01-03 DIAGNOSIS — F419 Anxiety disorder, unspecified: Secondary | ICD-10-CM

## 2015-01-03 MED ORDER — CLONAZEPAM 1 MG PO TABS: 1.0000 mg | ORAL_TABLET | Freq: Four times a day (QID) | ORAL | Status: DC | PRN

## 2015-01-03 MED ORDER — SERTRALINE HCL 100 MG PO TABS: ORAL_TABLET | ORAL | Status: DC

## 2015-01-03 NOTE — Progress Notes (Signed)
   Subjective:    Patient ID: Devin Daugherty, male    DOB: [birth year], 64 y.o.   MRN: [id number]  Anxiety Presents for initial visit. Onset was 1 to 6 months ago. The problem has been rapidly worsening. Symptoms include nervous/anxious behavior. Symptoms occur constantly. The severity of symptoms is causing significant distress. Nothing aggravates the symptoms. The quality of sleep is poor.   There are no known risk factors. Treatments tried: Xanax. The treatment provided moderate relief.   Patient states that he has no other concerns at this time.    Review of Systems  Psychiatric/Behavioral: The patient is nervous/anxious.        Objective:   Physical Exam  Fine tremor in hands related to anxiety      Assessment & Plan:  Anxiety-he has generalized anxiety disorder along with panic attacks we will increase Zoloft 200 mg daily. Also switch from lorazepam to clonazepam. 1 mg maximum 4 times a day when necessary patient states he will not use it 4 times a day if he does not need to do so. He was given 80 tablets. He is instructed to follow-up again within 1-2 weeks. If this is doing well we will prescribe a month supply. Also referral to psychiatry.  It should be noted that the patient wondered what to do if he took 1 pill and absolutely did nothing. I encouraged him that he needs to try the medication be on just 1 pill. I assured him that switching anxiety medicine will not trigger a seizure  tremors-I feel this is due to his severe anxiety. Hopefully medication will help

## 2015-01-06 ENCOUNTER — Telehealth: Payer: Self-pay | Admitting: Family Medicine

## 2015-01-06 ENCOUNTER — Encounter: Payer: Self-pay | Admitting: Family Medicine

## 2015-01-06 ENCOUNTER — Ambulatory Visit (INDEPENDENT_AMBULATORY_CARE_PROVIDER_SITE_OTHER): Payer: Medicare Other | Admitting: Family Medicine

## 2015-01-06 VITALS — BP 112/80 | Ht 67.0 in | Wt 184.0 lb

## 2015-01-06 DIAGNOSIS — I10 Essential (primary) hypertension: Secondary | ICD-10-CM

## 2015-01-06 DIAGNOSIS — G4700 Insomnia, unspecified: Secondary | ICD-10-CM

## 2015-01-06 DIAGNOSIS — R0600 Dyspnea, unspecified: Secondary | ICD-10-CM

## 2015-01-06 MED ORDER — LORAZEPAM 2 MG PO TABS: 2.0000 mg | ORAL_TABLET | Freq: Three times a day (TID) | ORAL | Status: DC | PRN

## 2015-01-06 MED ORDER — SERTRALINE HCL 100 MG PO TABS: ORAL_TABLET | ORAL | Status: DC

## 2015-01-06 NOTE — Telephone Encounter (Signed)
Pt told spouse that the stress an anxiety is just about unbearable

## 2015-01-06 NOTE — Telephone Encounter (Signed)
Pt has chronic challenge with anxiety, needs permanently a psychiatrist to manage this (tell family that), have brend work on referral and give me an update on that before i see pt today, appt later today

## 2015-01-06 NOTE — Telephone Encounter (Signed)
Discussed with referral coordinator- office visit scheduled today with Dr. Brett CanalesSteve

## 2015-01-06 NOTE — Progress Notes (Signed)
   Subjective:    Patient ID: Devin Daugherty, male    DOB: 1951-02-07, 64 y.o.   MRN: [id number]  HPIFollow up on anxiety. Taking klonopin one qid prn. Pt states not helping. Feeling nervous all day.   Patient notes increased heart rate at times. Feels more shaky. Also notes more trouble sleeping. Also more shortness of breath with exertion.  Claims compliant with all other medications.  Mild flare of depression though not major. No suicidal or homicidal thoughts.  Leg cramping intermittently Review of Systems No headache no chest pain no back pain no change in bowel habits    Objective:   Physical Exam Alert vital stable. Anxious appearing HEENT normal. Lungs clear. Heart regular in rhythm. Ankles without edema.       Assessment & Plan:  Impression #1 insomnia #2 hypertension fair control #3 multiple physical symptoms likely related to #4 #4 profound anxiety with substantial physical symptomatology. Discussed length. Plan Will increase lorazepam to 2 mg 3 times a day. Potential side effects discussed. Decrease Zoloft back to 150 mg. I strongly feel this patient needs a psychiatrist long-term and he agrees. Names of psychiatrists given to patient and spouse. Exercise encourage. 25 minutes spent most in discussion. WSL

## 2015-01-06 NOTE — Telephone Encounter (Signed)
Pt was seen here 1/8 and seen Dr Lorin PicketScott His meds were changed and he had a really  Rough weekend. Nervousness was increased, back of legs  Are tensed up?,   Family is concerned, want to know if the referral to the specialist Can be expedited or do you want to see him again today to maybe  Adjust his meds?   [facility]

## 2015-01-10 ENCOUNTER — Ambulatory Visit: Payer: Medicare Other | Admitting: Family Medicine

## 2015-01-13 ENCOUNTER — Encounter: Payer: Medicare Other | Admitting: Family Medicine

## 2015-01-14 ENCOUNTER — Ambulatory Visit (INDEPENDENT_AMBULATORY_CARE_PROVIDER_SITE_OTHER): Payer: Medicare Other | Admitting: Family Medicine

## 2015-01-14 ENCOUNTER — Encounter: Payer: Self-pay | Admitting: Family Medicine

## 2015-01-14 VITALS — BP 114/80 | Ht 66.0 in | Wt 184.2 lb

## 2015-01-14 DIAGNOSIS — Z0000 Encounter for general adult medical examination without abnormal findings: Secondary | ICD-10-CM

## 2015-01-14 NOTE — Progress Notes (Signed)
   Subjective:    Patient ID: Devin Daugherty, male    DOB: 1951/05/09, 64 y.o.   MRN: [id number]  HPI AWV- Annual Wellness Visit  The patient was seen for their annual wellness visit. The patient's past medical history, surgical history, and family history were reviewed. Pertinent vaccines were reviewed ( tetanus, pneumonia, shingles, flu) The patient's medication list was reviewed and updated.  The height and weight were entered. The patient's current BMI is: 29.74  Cognitive screening was completed. Outcome of Mini - Cog: passed  Falls within the past 6 months:none  Current tobacco usage: non-smoker (All patients who use tobacco were given written and verbal information on quitting)  Recent listing of emergency department/hospitalizations over the past year were reviewed.  current specialist the patient sees on a regular basis: Dr. Charlestine NightMeans    Medicare annual wellness visit patient questionnaire was reviewed.  A written screening schedule for the patient for the next 5-10 years was given. Appropriate discussion of followup regarding next visit was discussed.  Patient states that he has no concerns today.   Last colonoscopy five yrs ago, ding a lot of walking  Diet good, eats very little salt and fried fods, has cut sugar out of diet/  Review of Systems  Constitutional: Negative for fever, activity change and appetite change.  HENT: Negative for congestion and rhinorrhea.   Eyes: Negative for discharge.  Respiratory: Negative for cough and wheezing.   Cardiovascular: Negative for chest pain.  Gastrointestinal: Negative for vomiting, abdominal pain and blood in stool.  Genitourinary: Negative for frequency and difficulty urinating.  Musculoskeletal: Negative for neck pain.  Skin: Negative for rash.  Allergic/Immunologic: Negative for environmental allergies and food allergies.  Neurological: Negative for weakness and headaches.  Psychiatric/Behavioral: Negative for  agitation.  All other systems reviewed and are negative.      Objective:   Physical Exam  Constitutional: He appears well-developed and well-nourished.  HENT:  Head: Normocephalic and atraumatic.  Right Ear: External ear normal.  Left Ear: External ear normal.  Nose: Nose normal.  Mouth/Throat: Oropharynx is clear and moist.  Eyes: EOM are normal. Pupils are equal, round, and reactive to light.  Neck: Normal range of motion. Neck supple. No thyromegaly present.  Cardiovascular: Normal rate, regular rhythm and normal heart sounds.   No murmur heard. Pulmonary/Chest: Effort normal and breath sounds normal. No respiratory distress. He has no wheezes.  Abdominal: Soft. Bowel sounds are normal. He exhibits no distension and no mass. There is no tenderness.  Genitourinary: Penis normal.  Prostate already examined by urologist  Musculoskeletal: Normal range of motion. He exhibits no edema.  Lymphadenopathy:    He has no cervical adenopathy.  Neurological: He is alert. He exhibits normal muscle tone.  Skin: Skin is warm and dry. No erythema.  Psychiatric: He has a normal mood and affect. His behavior is normal. Judgment normal.  Vitals reviewed.         Assessment & Plan:  Impression wellness exam plan diet discussed. Exercise discussed. Hemoccult cards. Recheck in 6 months. WSL

## 2015-02-05 ENCOUNTER — Telehealth: Payer: Self-pay | Admitting: Family Medicine

## 2015-02-05 NOTE — Telephone Encounter (Signed)
Please also see Quantity limit on pt's LORazepam (ATIVAN) 2 MG tablet (also in same green folder) Please advise

## 2015-02-05 NOTE — Telephone Encounter (Signed)
See jan 11 note, pt has profound serious anxiety and needs to stay on anxiety meds--has pt seen his psychiatrist yet?? They will need ot address this auestion long term but for now pt will need to purchase hiw own outside of the allotment covdred by his insurance

## 2015-02-05 NOTE — Telephone Encounter (Signed)
Please see letter in green folder from [employer] Change in treatment recommended due to pt's age, please advise

## 2015-02-17 NOTE — Telephone Encounter (Signed)
Spoke with patient, he states he is currently seeing psychiatry and that the psychiatrist has taken over writing Rx's for his sleep aid and his anxiety/depression meds

## 2015-06-10 ENCOUNTER — Telehealth: Payer: Self-pay | Admitting: Family Medicine

## 2015-06-10 DIAGNOSIS — E785 Hyperlipidemia, unspecified: Secondary | ICD-10-CM

## 2015-06-10 DIAGNOSIS — Z131 Encounter for screening for diabetes mellitus: Secondary | ICD-10-CM

## 2015-06-10 DIAGNOSIS — Z79899 Other long term (current) drug therapy: Secondary | ICD-10-CM

## 2015-06-10 NOTE — Telephone Encounter (Signed)
Notified patient that bloodwork has been ordered and can report to the lab.  

## 2015-06-10 NOTE — Telephone Encounter (Signed)
Pt is requesting lab orders to be sent over for his upcoming appt.  Last labs per epic were: lipid,hepatic,bmp,psa,tsh on 12/06/14

## 2015-06-10 NOTE — Telephone Encounter (Signed)
Lip liv glu 

## 2015-06-17 LAB — HEPATIC FUNCTION PANEL
ALT: 32 IU/L (ref 0–44)
AST: 29 IU/L (ref 0–40)
Albumin: 4.6 g/dL (ref 3.6–4.8)
Alkaline Phosphatase: 69 IU/L (ref 39–117)
Bilirubin Total: 0.3 mg/dL (ref 0.0–1.2)
Bilirubin, Direct: 0.08 mg/dL (ref 0.00–0.40)
Total Protein: 7.4 g/dL (ref 6.0–8.5)

## 2015-06-17 LAB — LIPID PANEL
Chol/HDL Ratio: 4.2 ratio units (ref 0.0–5.0)
Cholesterol, Total: 178 mg/dL (ref 100–199)
HDL: 42 mg/dL (ref >39)
LDL CALC: 92 mg/dL (ref 0–99)
TRIGLYCERIDES: 220 mg/dL — AB (ref 0–149)
VLDL CHOLESTEROL CAL: 44 mg/dL — AB (ref 5–40)

## 2015-06-17 LAB — GLUCOSE, RANDOM: Glucose: 100 mg/dL — ABNORMAL HIGH (ref 65–99)

## 2015-06-24 ENCOUNTER — Encounter: Payer: Self-pay | Admitting: Family Medicine

## 2015-06-24 ENCOUNTER — Ambulatory Visit (INDEPENDENT_AMBULATORY_CARE_PROVIDER_SITE_OTHER): Payer: Medicare Other | Admitting: Family Medicine

## 2015-06-24 VITALS — BP 128/74 | Ht 66.0 in | Wt 181.5 lb

## 2015-06-24 DIAGNOSIS — G4700 Insomnia, unspecified: Secondary | ICD-10-CM | POA: Diagnosis not present

## 2015-06-24 DIAGNOSIS — I2510 Atherosclerotic heart disease of native coronary artery without angina pectoris: Secondary | ICD-10-CM

## 2015-06-24 DIAGNOSIS — R7309 Other abnormal glucose: Secondary | ICD-10-CM | POA: Diagnosis not present

## 2015-06-24 DIAGNOSIS — E785 Hyperlipidemia, unspecified: Secondary | ICD-10-CM | POA: Diagnosis not present

## 2015-06-24 DIAGNOSIS — R739 Hyperglycemia, unspecified: Secondary | ICD-10-CM

## 2015-06-24 DIAGNOSIS — I10 Essential (primary) hypertension: Secondary | ICD-10-CM | POA: Diagnosis not present

## 2015-06-24 DIAGNOSIS — I2584 Coronary atherosclerosis due to calcified coronary lesion: Secondary | ICD-10-CM | POA: Diagnosis not present

## 2015-06-24 MED ORDER — ROSUVASTATIN CALCIUM 40 MG PO TABS: 40.0000 mg | ORAL_TABLET | Freq: Every day | ORAL | Status: DC

## 2015-06-24 MED ORDER — SERTRALINE HCL 100 MG PO TABS: ORAL_TABLET | ORAL | Status: DC

## 2015-06-24 MED ORDER — BENAZEPRIL HCL 10 MG PO TABS: 10.0000 mg | ORAL_TABLET | Freq: Every day | ORAL | Status: DC

## 2015-06-24 NOTE — Progress Notes (Signed)
   Subjective:    Patient ID: Devin Daugherty, male    DOB: 21-Dec-1951, 64 y.o.   MRN: [id number] Patient arrives office with multiple concerns. HPI  Patient in today to discuss lab results from 06/16/15.   Patient would also like to discuss psychiatric issues this visit.  Pt having a bad experience with the [facility] provider  Has a history of high blood pressure medicine. Compliant with medicine. No obvious side effects. Watching salt intake. Walking a lot.   History of hyperlipidemia. Claims compliance with Crestor. States his diet is very good. Has recently been on Risperdal which the patient red can cause elevation of triglycerides. He also red that gabapentin can cause elevation of LDL    Patient states that he is seeing a psychiatrist but still not feeling well.  Results for orders placed or performed in visit on 06/10/15  Lipid panel  Result Value Ref Range   Cholesterol, Total 178 100 - 199 mg/dL   Triglycerides 782220 (H) 0 - 149 mg/dL   HDL 42 >95>39 mg/dL   VLDL Cholesterol Cal 44 (H) 5 - 40 mg/dL   LDL Calculated 92 0 - 99 mg/dL   Chol/HDL Ratio 4.2 0.0 - 5.0 ratio units  Hepatic function panel  Result Value Ref Range   Total Protein 7.4 6.0 - 8.5 g/dL   Albumin 4.6 3.6 - 4.8 g/dL   Bilirubin Total 0.3 0.0 - 1.2 mg/dL   Bilirubin, Direct 6.210.08 0.00 - 0.40 mg/dL   Alkaline Phosphatase 69 39 - 117 IU/L   AST 29 0 - 40 IU/L   ALT 32 0 - 44 IU/L  Glucose, random  Result Value Ref Range   Glucose 100 (H) 65 - 99 mg/dL     [id number] and [id number]   Review of Systems No headache no chest pain and back pain no abdominal pain no change in bowel habits    Objective:   Physical Exam  Alert vital stable blood pressure good on repeat HEENT normal. Lungs clear. Heart regular in rhythm. Ankles without edema.      Assessment & Plan:  Impression 1 hypertension good control discussed #2 hyperlipidemia suboptimal in control discussed #3 impaired fasting glucose discussed #4 history of  pretty severe anxiety and mood disorder. Now followed by psychiatrist. Roderick PeeFrustrated that he does not get to see the physician. Discussed at great length. Plan maintain Crestor current dose. Diet exercise discussed. Maintain other medications. Recheck in 6 months. WSL

## 2015-07-03 ENCOUNTER — Telehealth: Payer: Self-pay | Admitting: *Deleted

## 2015-07-03 NOTE — Telephone Encounter (Signed)
Patient scheduled office visit to discuss psych recommendations

## 2015-07-03 NOTE — Telephone Encounter (Signed)
Left message to return call 

## 2015-07-03 NOTE — Telephone Encounter (Signed)
Patient needs office visit with Dr Brett CanalesSteve to discuss psych recomendations

## 2015-07-07 ENCOUNTER — Ambulatory Visit: Payer: Medicare Other | Admitting: Family Medicine

## 2015-07-15 DIAGNOSIS — R7303 Prediabetes: Secondary | ICD-10-CM | POA: Insufficient documentation

## 2015-12-03 ENCOUNTER — Telehealth: Payer: Self-pay | Admitting: Family Medicine

## 2015-12-03 DIAGNOSIS — Z79899 Other long term (current) drug therapy: Secondary | ICD-10-CM

## 2015-12-03 DIAGNOSIS — Z125 Encounter for screening for malignant neoplasm of prostate: Secondary | ICD-10-CM

## 2015-12-03 DIAGNOSIS — E785 Hyperlipidemia, unspecified: Secondary | ICD-10-CM

## 2015-12-03 NOTE — Telephone Encounter (Signed)
Lip liv m7 psa 

## 2015-12-03 NOTE — Telephone Encounter (Signed)
bw orders please last labs 06/16/15  Lip, Hep, Glucose random   Has appt 12/28

## 2015-12-04 NOTE — Telephone Encounter (Signed)
Notified patient bloodwork has been ordered.  

## 2015-12-11 LAB — BASIC METABOLIC PANEL
BUN/Creatinine Ratio: 11 (ref 10–22)
BUN: 12 mg/dL (ref 8–27)
CALCIUM: 9.9 mg/dL (ref 8.6–10.2)
CO2: 25 mmol/L (ref 18–29)
Chloride: 103 mmol/L (ref 96–106)
Creatinine, Ser: 1.1 mg/dL (ref 0.76–1.27)
GFR calc Af Amer: 80 mL/min/{1.73_m2} (ref >59)
GFR calc non Af Amer: 70 mL/min/{1.73_m2} (ref >59)
GLUCOSE: 95 mg/dL (ref 65–99)
POTASSIUM: 5.2 mmol/L (ref 3.5–5.2)
SODIUM: 139 mmol/L (ref 134–144)

## 2015-12-11 LAB — HEPATIC FUNCTION PANEL
ALK PHOS: 72 IU/L (ref 39–117)
ALT: 28 IU/L (ref 0–44)
AST: 25 IU/L (ref 0–40)
Albumin: 4.3 g/dL (ref 3.6–4.8)
Bilirubin Total: 0.3 mg/dL (ref 0.0–1.2)
Bilirubin, Direct: 0.08 mg/dL (ref 0.00–0.40)
TOTAL PROTEIN: 7.3 g/dL (ref 6.0–8.5)

## 2015-12-11 LAB — PSA: PROSTATE SPECIFIC AG, SERUM: 1.9 ng/mL (ref 0.0–4.0)

## 2015-12-11 LAB — LIPID PANEL
Chol/HDL Ratio: 3.6 ratio units (ref 0.0–5.0)
Cholesterol, Total: 144 mg/dL (ref 100–199)
HDL: 40 mg/dL (ref >39)
LDL CALC: 65 mg/dL (ref 0–99)
Triglycerides: 193 mg/dL — ABNORMAL HIGH (ref 0–149)
VLDL CHOLESTEROL CAL: 39 mg/dL (ref 5–40)

## 2015-12-24 ENCOUNTER — Encounter: Payer: Self-pay | Admitting: Family Medicine

## 2015-12-24 ENCOUNTER — Ambulatory Visit (INDEPENDENT_AMBULATORY_CARE_PROVIDER_SITE_OTHER): Payer: Medicare Other | Admitting: Family Medicine

## 2015-12-24 VITALS — BP 130/84 | Ht 66.0 in | Wt 197.6 lb

## 2015-12-24 DIAGNOSIS — I10 Essential (primary) hypertension: Secondary | ICD-10-CM | POA: Diagnosis not present

## 2015-12-24 DIAGNOSIS — K219 Gastro-esophageal reflux disease without esophagitis: Secondary | ICD-10-CM

## 2015-12-24 DIAGNOSIS — E785 Hyperlipidemia, unspecified: Secondary | ICD-10-CM

## 2015-12-24 MED ORDER — LORAZEPAM 2 MG PO TABS: 2.0000 mg | ORAL_TABLET | Freq: Three times a day (TID) | ORAL | Status: DC | PRN

## 2015-12-24 MED ORDER — RANITIDINE HCL 300 MG PO TABS: 300.0000 mg | ORAL_TABLET | Freq: Every day | ORAL | Status: DC

## 2015-12-24 MED ORDER — ROSUVASTATIN CALCIUM 40 MG PO TABS: 40.0000 mg | ORAL_TABLET | Freq: Every day | ORAL | Status: DC

## 2015-12-24 MED ORDER — SERTRALINE HCL 100 MG PO TABS
ORAL_TABLET | ORAL | Status: DC
Start: 2015-12-24 — End: 2016-06-22

## 2015-12-24 MED ORDER — BENAZEPRIL HCL 10 MG PO TABS: 10.0000 mg | ORAL_TABLET | Freq: Every day | ORAL | Status: DC

## 2015-12-24 NOTE — Progress Notes (Signed)
   Subjective:    Patient ID: Devin Daugherty, male    DOB: [birth year], 64 y.o.   MRN: [id number] Patient arrives office for follow-up of multiple concerns   Hyperlipidemia This is a chronic problem. The current episode started more than 1 year ago. Treatments tried: crestor. There are no compliance problems.  Risk factors for coronary artery disease include dyslipidemia and hypertension.   Results for orders placed or performed in visit on 12/03/15  Lipid panel  Result Value Ref Range   Cholesterol, Total 144 100 - 199 mg/dL   Triglycerides 782193 (H) 0 - 149 mg/dL   HDL 40 >95>39 mg/dL   VLDL Cholesterol Cal 39 5 - 40 mg/dL   LDL Calculated 65 0 - 99 mg/dL   Chol/HDL Ratio 3.6 0.0 - 5.0 ratio units  Hepatic function panel  Result Value Ref Range   Total Protein 7.3 6.0 - 8.5 g/dL   Albumin 4.3 3.6 - 4.8 g/dL   Bilirubin Total 0.3 0.0 - 1.2 mg/dL   Bilirubin, Direct 6.210.08 0.00 - 0.40 mg/dL   Alkaline Phosphatase 72 39 - 117 IU/L   AST 25 0 - 40 IU/L   ALT 28 0 - 44 IU/L  Basic metabolic panel  Result Value Ref Range   Glucose 95 65 - 99 mg/dL   BUN 12 8 - 27 mg/dL   Creatinine, Ser 3.081.10 0.76 - 1.27 mg/dL   GFR calc non Af Amer 70 >59 mL/min/1.73   GFR calc Af Amer 80 >59 mL/min/1.73   BUN/Creatinine Ratio 11 10 - 22   Sodium 139 134 - 144 mmol/L   Potassium 5.2 3.5 - 5.2 mmol/L   Chloride 103 96 - 106 mmol/L   CO2 25 18 - 29 mmol/L   Calcium 9.9 8.6 - 10.2 mg/dL  PSA  Result Value Ref Range   Prostate Specific Ag, Serum 1.9 0.0 - 4.0 ng/mL   Watching fats limnit that  exdercise walking regulaly Compliant with blood pressure medication. No obvious side effects. Watching salt intake. Generally does not miss a dose. Medications reviewed today.    Patient with c/o heartburn. Usually occurs sevdral times per wk, Worse in the evenings, no certain. severa times per wk, milk and banana calms it down   Review of Systems No headache no chest pain no shortness breath no  abdominal pain no change in bowel habits ROS otherwise negative    Objective:   Physical Exam Alert vitals stable no acute distress blood pressure excellent on repeat HEENT normal lungs clear heart regular in rhythm abdomen benign       Assessment & Plan:  Impression reflux discussed at length #2 hypertension good control medications reviewed to maintain same #3 hyperlipidemia good control medications to maintain same #4 coronary artery disease clinically silent plan diet exercise discussed. Patient to maintain mental health medicines through psychiatrist. Initiate Zantac 300 daily or when necessary. Educational information given. Chronic meds renewed WSL

## 2015-12-24 NOTE — Patient Instructions (Signed)
Results for orders placed or performed in visit on 12/03/15  Lipid panel  Result Value Ref Range   Cholesterol, Total 144 100 - 199 mg/dL   Triglycerides 409 (H) 0 - 149 mg/dL   HDL 40 >81 mg/dL   VLDL Cholesterol Cal 39 5 - 40 mg/dL   LDL Calculated 65 0 - 99 mg/dL   Chol/HDL Ratio 3.6 0.0 - 5.0 ratio units  Hepatic function panel  Result Value Ref Range   Total Protein 7.3 6.0 - 8.5 g/dL   Albumin 4.3 3.6 - 4.8 g/dL   Bilirubin Total 0.3 0.0 - 1.2 mg/dL   Bilirubin, Direct 1.91 0.00 - 0.40 mg/dL   Alkaline Phosphatase 72 39 - 117 IU/L   AST 25 0 - 40 IU/L   ALT 28 0 - 44 IU/L  Basic metabolic panel  Result Value Ref Range   Glucose 95 65 - 99 mg/dL   BUN 12 8 - 27 mg/dL   Creatinine, Ser 4.78 0.76 - 1.27 mg/dL   GFR calc non Af Amer 70 >59 mL/min/1.73   GFR calc Af Amer 80 >59 mL/min/1.73   BUN/Creatinine Ratio 11 10 - 22   Sodium 139 134 - 144 mmol/L   Potassium 5.2 3.5 - 5.2 mmol/L   Chloride 103 96 - 106 mmol/L   CO2 25 18 - 29 mmol/L   Calcium 9.9 8.6 - 10.2 mg/dL  PSA  Result Value Ref Range   Prostate Specific Ag, Serum 1.9 0.0 - 4.0 ng/mL   Gastroesophageal Reflux Disease, Adult Normally, food travels down the esophagus and stays in the stomach to be digested. However, when a person has gastroesophageal reflux disease (GERD), food and stomach acid move back up into the esophagus. When this happens, the esophagus becomes sore and inflamed. Over time, GERD can create small holes (ulcers) in the lining of the esophagus.  CAUSES This condition is caused by a problem with the muscle between the esophagus and the stomach (lower esophageal sphincter, or LES). Normally, the LES muscle closes after food passes through the esophagus to the stomach. When the LES is weakened or abnormal, it does not close properly, and that allows food and stomach acid to go back up into the esophagus. The LES can be weakened by certain dietary substances, medicines, and medical conditions,  including:  Tobacco use.  Pregnancy.  Having a hiatal hernia.  Heavy alcohol use.  Certain foods and beverages, such as coffee, chocolate, onions, and peppermint. RISK FACTORS This condition is more likely to develop in:  People who have an increased body weight.  People who have connective tissue disorders.  People who use NSAID medicines. SYMPTOMS Symptoms of this condition include:  Heartburn.  Difficult or painful swallowing.  The feeling of having a lump in the throat.  Abitter taste in the mouth.  Bad breath.  Having a large amount of saliva.  Having an upset or bloated stomach.  Belching.  Chest pain.  Shortness of breath or wheezing.  Ongoing (chronic) cough or a night-time cough.  Wearing away of tooth enamel.  Weight loss. Different conditions can cause chest pain. Make sure to see your health care provider if you experience chest pain. DIAGNOSIS Your health care provider will take a medical history and perform a physical exam. To determine if you have mild or severe GERD, your health care provider may also monitor how you respond to treatment. You may also have other tests, including:  An endoscopy toexamine your stomach and esophagus  with a small camera.  A test thatmeasures the acidity level in your esophagus.  A test thatmeasures how much pressure is on your esophagus.  A barium swallow or modified barium swallow to show the shape, size, and functioning of your esophagus. TREATMENT The goal of treatment is to help relieve your symptoms and to prevent complications. Treatment for this condition may vary depending on how severe your symptoms are. Your health care provider may recommend:  Changes to your diet.  Medicine.  Surgery. HOME CARE INSTRUCTIONS Diet  Follow a diet as recommended by your health care provider. This may involve avoiding foods and drinks such as:  Coffee and tea (with or without caffeine).  Drinks that  containalcohol.  Energy drinks and sports drinks.  Carbonated drinks or sodas.  Chocolate and cocoa.  Peppermint and mint flavorings.  Garlic and onions.  Horseradish.  Spicy and acidic foods, including peppers, chili powder, curry powder, vinegar, hot sauces, and barbecue sauce.  Citrus fruit juices and citrus fruits, such as oranges, lemons, and limes.  Tomato-based foods, such as red sauce, chili, salsa, and pizza with red sauce.  Fried and fatty foods, such as donuts, french fries, potato chips, and high-fat dressings.  High-fat meats, such as hot dogs and fatty cuts of red and white meats, such as rib eye steak, sausage, ham, and bacon.  High-fat dairy items, such as whole milk, butter, and cream cheese.  Eat small, frequent meals instead of large meals.  Avoid drinking large amounts of liquid with your meals.  Avoid eating meals during the 2-3 hours before bedtime.  Avoid lying down right after you eat.  Do not exercise right after you eat. General Instructions  Pay attention to any changes in your symptoms.  Take over-the-counter and prescription medicines only as told by your health care provider. Do not take aspirin, ibuprofen, or other NSAIDs unless your health care provider told you to do so.  Do not use any tobacco products, including cigarettes, chewing tobacco, and e-cigarettes. If you need help quitting, ask your health care provider.  Wear loose-fitting clothing. Do not wear anything tight around your waist that causes pressure on your abdomen.  Raise (elevate) the head of your bed 6 inches (15cm).  Try to reduce your stress, such as with yoga or meditation. If you need help reducing stress, ask your health care provider.  If you are overweight, reduce your weight to an amount that is healthy for you. Ask your health care provider for guidance about a safe weight loss goal.  Keep all follow-up visits as told by your health care provider. This is  important. SEEK MEDICAL CARE IF:  You have new symptoms.  You have unexplained weight loss.  You have difficulty swallowing, or it hurts to swallow.  You have wheezing or a persistent cough.  Your symptoms do not improve with treatment.  You have a hoarse voice. SEEK IMMEDIATE MEDICAL CARE IF:  You have pain in your arms, neck, jaw, teeth, or back.  You feel sweaty, dizzy, or light-headed.  You have chest pain or shortness of breath.  You vomit and your vomit looks like blood or coffee grounds.  You faint.  Your stool is bloody or black.  You cannot swallow, drink, or eat.   This information is not intended to replace advice given to you by your health care provider. Make sure you discuss any questions you have with your health care provider.   Document Released: 09/22/2005 Document Revised: 09/03/2015  Document Reviewed: 04/09/2015 Elsevier Interactive Patient Education [employer]

## 2016-01-20 IMAGING — CT CT ABD-PELV W/ CM
2 of 5 series · 17 of 46 positions shown, 19 images · IV contrast (READICAT/WATER & [ID] OMNI 300)
Comparison: None.

CLINICAL DATA: Rule out left inguinal hernia.  Left inguinal pain.

EXAM:
CT ABDOMEN AND PELVIS WITH CONTRAST
TECHNIQUE: Multidetector CT imaging of the abdomen and pelvis was performed
using the standard protocol following bolus administration of
intravenous contrast.
BUN and creatinine were obtained on site at [HOSPITAL] at
[HOSPITAL].
Results:  BUN 11 mg/dL,  Creatinine 1.1 mg/dL.
CONTRAST:  125mL OMNIPAQUE IOHEXOL 300 MG/ML  SOLN

[Series 2: abd/pelvis with · axial · 0.77mm/px · z∈[-465,+0]mm · 14 of 105 slices shown, 16 images]
[im 6/105  soft-tissue]
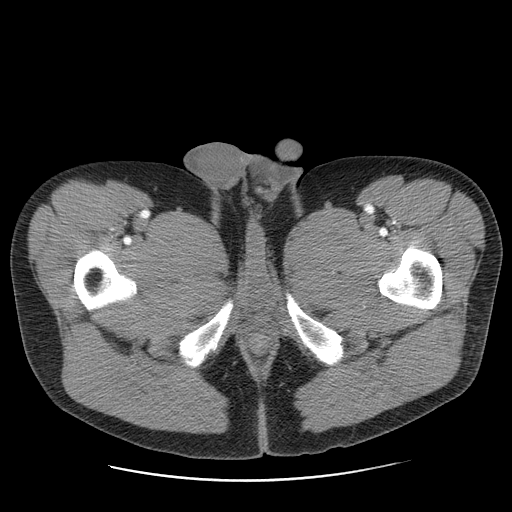
[im 6/105  bone]
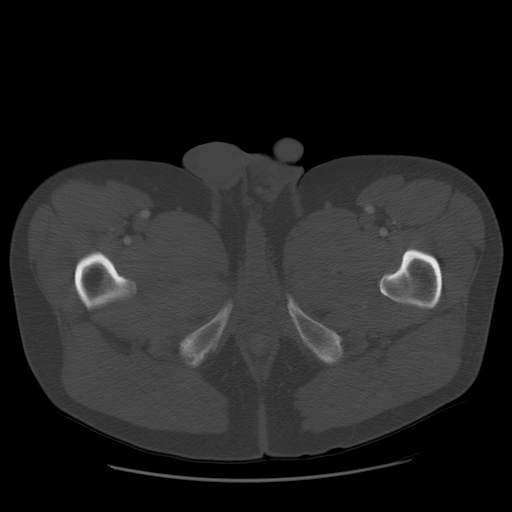
[im 11/105  soft-tissue]
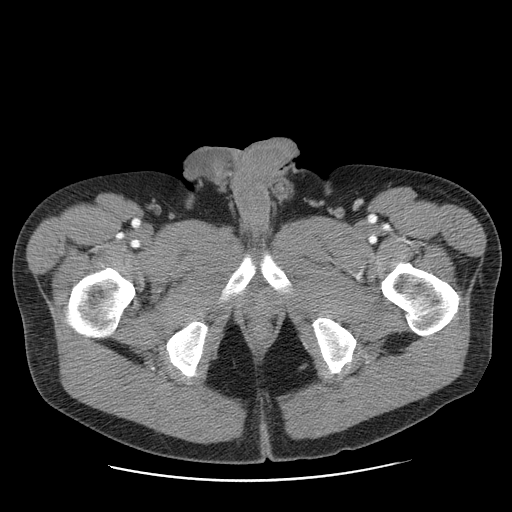
[im 22/105  soft-tissue]
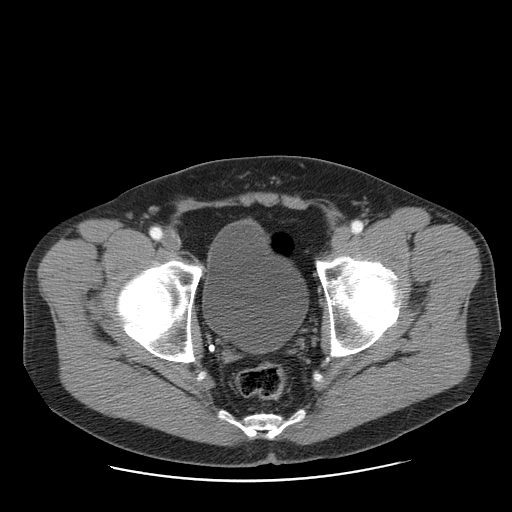
[im 28/105  soft-tissue]
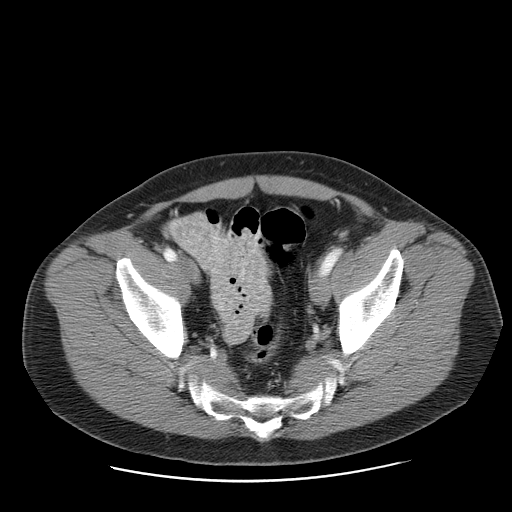
[im 33/105  soft-tissue]
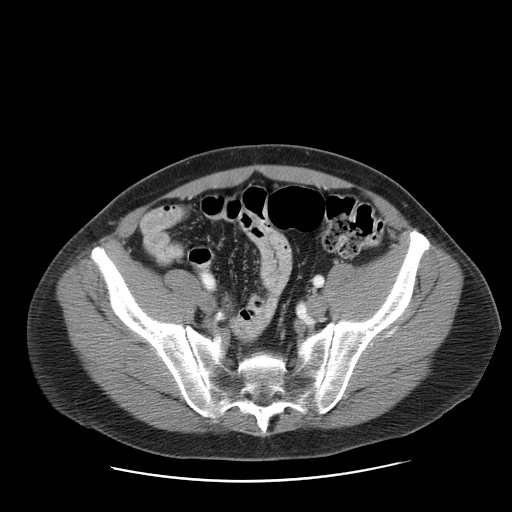
[im 44/105  soft-tissue]
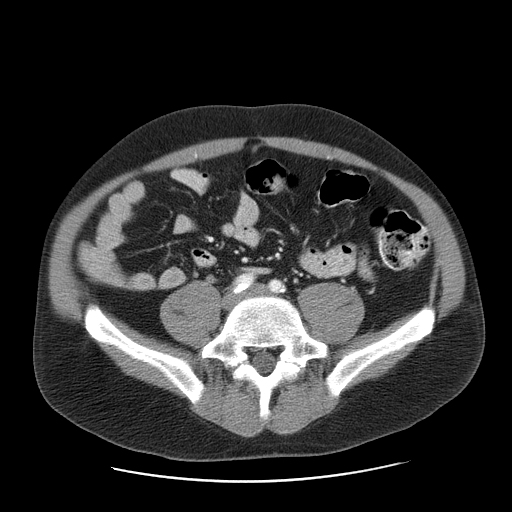
[im 50/105  soft-tissue]
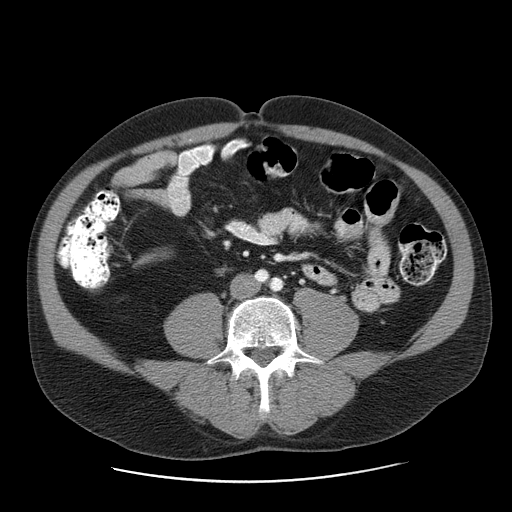
[im 55/105  soft-tissue]
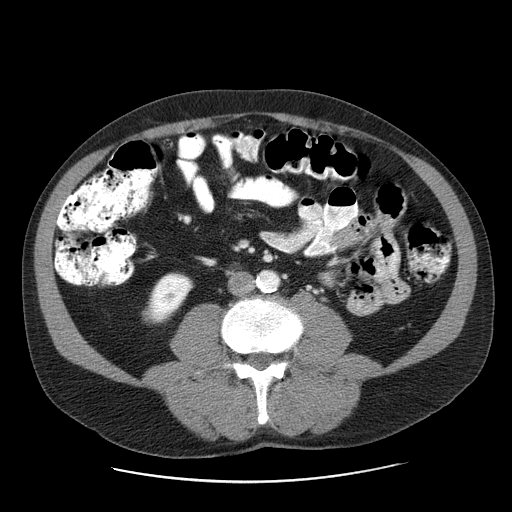
[im 61/105  soft-tissue]
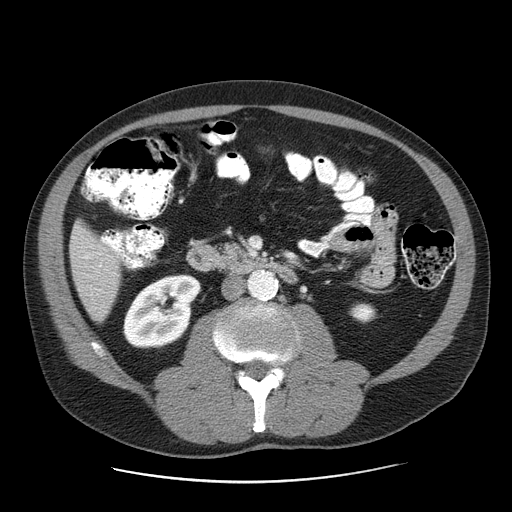
[im 61/105  bone]
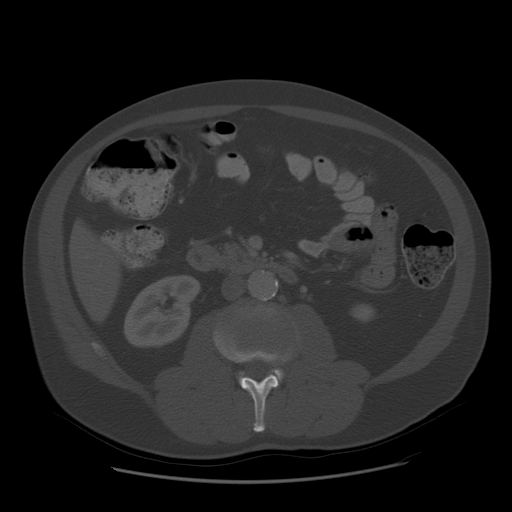
[im 72/105  soft-tissue]
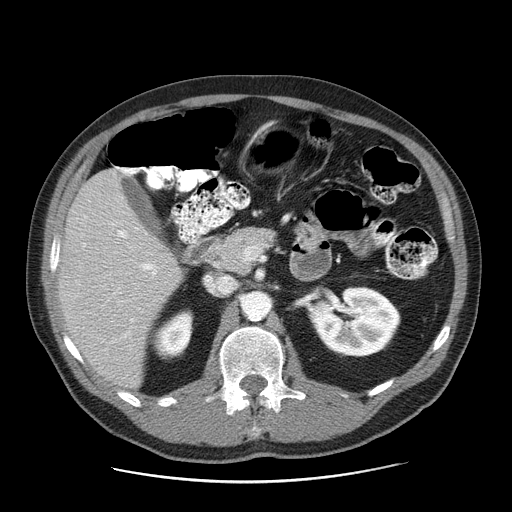
[im 77/105  soft-tissue]
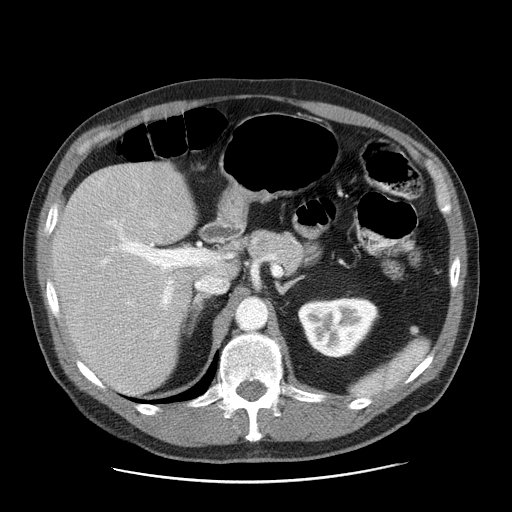
[im 83/105  soft-tissue]
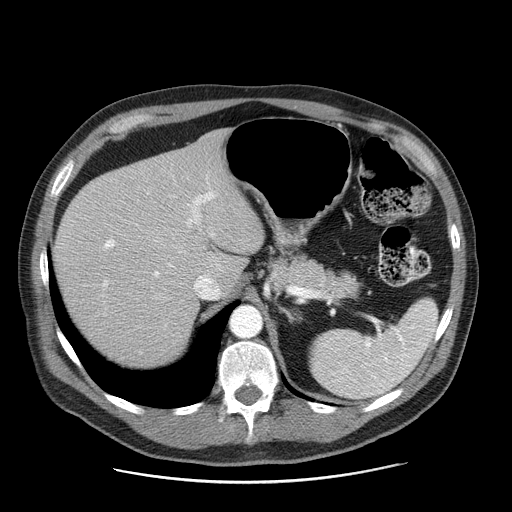
[im 94/105  soft-tissue]
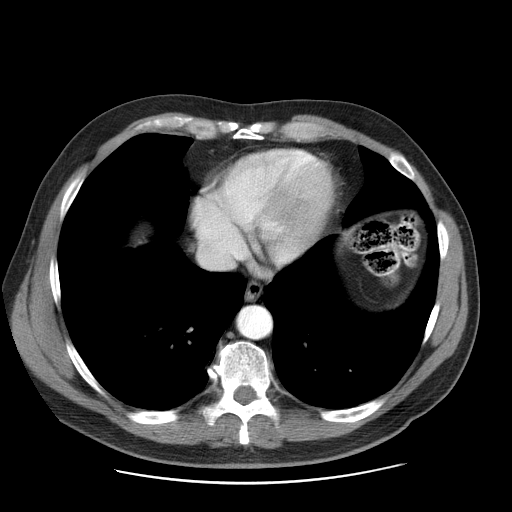
[im 99/105  soft-tissue]
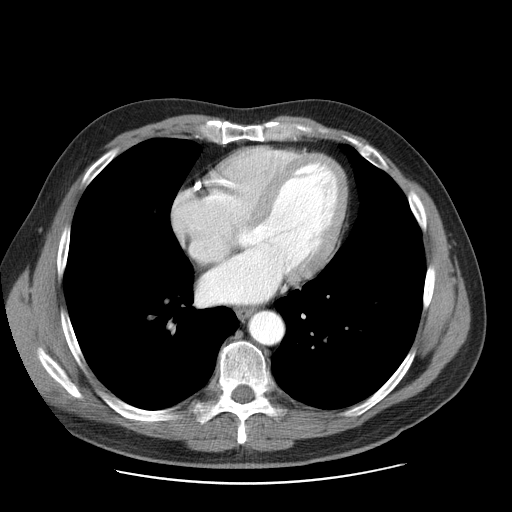

[Series 400: cor · coronal · 1.06mm/px · 3 of 126 slices shown]
[im 42/126  soft-tissue]
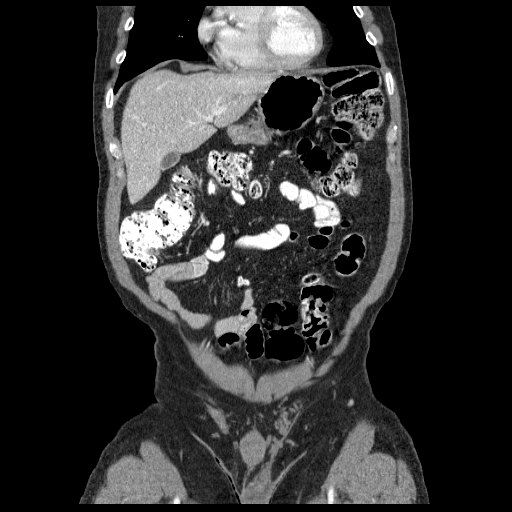
[im 56/126  soft-tissue]
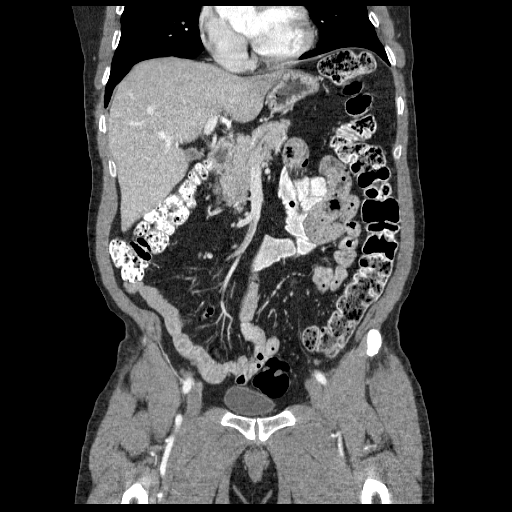
[im 70/126  soft-tissue]
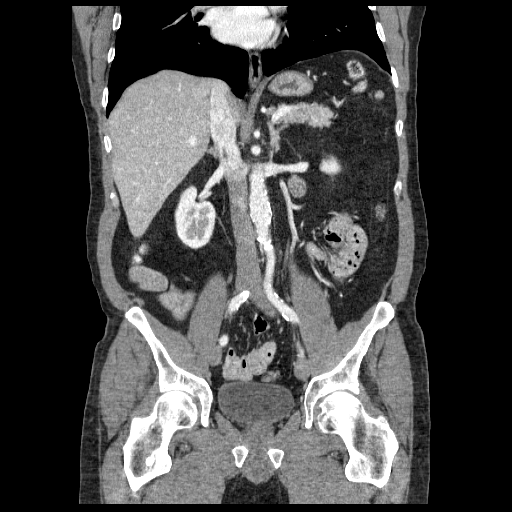

[17 of 46 positions shown; findings below may reference images not displayed]

FINDINGS: BODY WALL: Changes of right inguinal hernia repair, without
recurrence. No left inguinal/groin hernia.

LOWER CHEST: Diffuse coronary atherosclerosis.

ABDOMEN/PELVIS:

Liver: No focal abnormality.

Biliary: No evidence of biliary obstruction or stone.

Pancreas: Unremarkable.

Spleen: Unremarkable.

Adrenals: Unremarkable.

Kidneys and ureters: Presumed sub cm cyst in the interpolar right
kidney.

Bladder: Unremarkable.

Reproductive: Small bilateral hydrocele. Tortuous and asymmetrically
prominent veins within the left spermatic cord.

Bowel: No obstruction. Laxity ileocolic mesentery with cecum and
patent appendix in the right upper quadrant.

Retroperitoneum: No mass or adenopathy.

Peritoneum: No ascites or pneumoperitoneum.

Vascular: Diffuse aortic and branch vessel atherosclerosis.

OSSEOUS: Bilateral L5 pars defects with trace anterolisthesis. No
significant L5-S1 disc narrowing relative to the other levels.
IMPRESSION: 1. Negative for left inguinal hernia.
2. Small bilateral hydrocele and possible left varicocele.

## 2016-03-15 DIAGNOSIS — F411 Generalized anxiety disorder: Secondary | ICD-10-CM | POA: Diagnosis not present

## 2016-03-15 DIAGNOSIS — G4700 Insomnia, unspecified: Secondary | ICD-10-CM | POA: Diagnosis not present

## 2016-03-15 DIAGNOSIS — F3341 Major depressive disorder, recurrent, in partial remission: Secondary | ICD-10-CM | POA: Diagnosis not present

## 2016-05-19 DIAGNOSIS — F3341 Major depressive disorder, recurrent, in partial remission: Secondary | ICD-10-CM | POA: Diagnosis not present

## 2016-06-01 ENCOUNTER — Telehealth: Payer: Self-pay | Admitting: Family Medicine

## 2016-06-01 DIAGNOSIS — R5383 Other fatigue: Secondary | ICD-10-CM

## 2016-06-01 DIAGNOSIS — Z79899 Other long term (current) drug therapy: Secondary | ICD-10-CM

## 2016-06-01 DIAGNOSIS — E785 Hyperlipidemia, unspecified: Secondary | ICD-10-CM

## 2016-06-01 NOTE — Telephone Encounter (Signed)
Notified patient lipid liver glucose we no longer recommend test levels unless hx of radiation etc.if patient insists we'll check but Dr. Brett CanalesSteve does not prescribe testosterone supplement because naturally lower by this age. Patient verbalized understanding and still wants testosterone checked. Blood work was ordered.

## 2016-06-01 NOTE — Telephone Encounter (Signed)
Pt is requesting lab orders to be sent over for an upcoming appt. Last labs per epic were: lipid,hepatic,bmp,and psa on 12/10/15. Pt would also like a testosterone level done.

## 2016-06-01 NOTE — Telephone Encounter (Signed)
Lip liv glu we no longer rec test levels unless hx of radiationetc.if pt insists we'll ck but i do not prescribe test supp  bevcause naturally lower by this age

## 2016-06-14 DIAGNOSIS — R5383 Other fatigue: Secondary | ICD-10-CM | POA: Diagnosis not present

## 2016-06-14 DIAGNOSIS — Z79899 Other long term (current) drug therapy: Secondary | ICD-10-CM | POA: Diagnosis not present

## 2016-06-14 DIAGNOSIS — E785 Hyperlipidemia, unspecified: Secondary | ICD-10-CM | POA: Diagnosis not present

## 2016-06-15 LAB — HEPATIC FUNCTION PANEL
ALBUMIN: 4.4 g/dL (ref 3.6–4.8)
ALT: 26 IU/L (ref 0–44)
AST: 26 IU/L (ref 0–40)
Alkaline Phosphatase: 71 IU/L (ref 39–117)
Bilirubin Total: 0.3 mg/dL (ref 0.0–1.2)
Bilirubin, Direct: 0.09 mg/dL (ref 0.00–0.40)
TOTAL PROTEIN: 7.4 g/dL (ref 6.0–8.5)

## 2016-06-15 LAB — LIPID PANEL
CHOLESTEROL TOTAL: 137 mg/dL (ref 100–199)
Chol/HDL Ratio: 3.4 ratio units (ref 0.0–5.0)
HDL: 40 mg/dL (ref >39)
LDL CALC: 58 mg/dL (ref 0–99)
Triglycerides: 197 mg/dL — ABNORMAL HIGH (ref 0–149)
VLDL CHOLESTEROL CAL: 39 mg/dL (ref 5–40)

## 2016-06-15 LAB — TESTOSTERONE: TESTOSTERONE: 314 ng/dL — AB (ref 348–1197)

## 2016-06-15 LAB — GLUCOSE, RANDOM: Glucose: 99 mg/dL (ref 65–99)

## 2016-06-21 DIAGNOSIS — F3341 Major depressive disorder, recurrent, in partial remission: Secondary | ICD-10-CM | POA: Diagnosis not present

## 2016-06-21 DIAGNOSIS — G4700 Insomnia, unspecified: Secondary | ICD-10-CM | POA: Diagnosis not present

## 2016-06-21 DIAGNOSIS — F411 Generalized anxiety disorder: Secondary | ICD-10-CM | POA: Diagnosis not present

## 2016-06-22 ENCOUNTER — Ambulatory Visit (INDEPENDENT_AMBULATORY_CARE_PROVIDER_SITE_OTHER): Payer: Medicare Other | Admitting: Family Medicine

## 2016-06-22 ENCOUNTER — Encounter: Payer: Self-pay | Admitting: Family Medicine

## 2016-06-22 VITALS — BP 130/86 | Ht 66.0 in | Wt 198.8 lb

## 2016-06-22 DIAGNOSIS — Z1211 Encounter for screening for malignant neoplasm of colon: Secondary | ICD-10-CM | POA: Diagnosis not present

## 2016-06-22 DIAGNOSIS — I10 Essential (primary) hypertension: Secondary | ICD-10-CM

## 2016-06-22 DIAGNOSIS — Z0000 Encounter for general adult medical examination without abnormal findings: Secondary | ICD-10-CM | POA: Diagnosis not present

## 2016-06-22 DIAGNOSIS — E785 Hyperlipidemia, unspecified: Secondary | ICD-10-CM

## 2016-06-22 MED ORDER — BENAZEPRIL HCL 10 MG PO TABS: 10.0000 mg | ORAL_TABLET | Freq: Every day | ORAL | Status: DC

## 2016-06-22 MED ORDER — ROSUVASTATIN CALCIUM 40 MG PO TABS: 40.0000 mg | ORAL_TABLET | Freq: Every day | ORAL | Status: DC

## 2016-06-22 NOTE — Progress Notes (Signed)
Subjective:    Patient ID: Devin Daugherty, male    DOB: September 07, 1951, [age] y.o.   MRN: [id number]  HPI The patient comes in today for a wellness visit.    A review of their health history was completed.  A review of medications was also completed.  Any needed refills; yes of cholesterol and blood pressure meds  Eating habits: eating healthy  Falls/  MVA accidents in past few months: none  Regular exercise: walk everyday  Specialist pt sees on regular basis: dr Montez Moritacarter- depression and anxiety  Preventative health issues were discussed.   Additional concerns: fatigue Results for orders placed or performed in visit on 06/01/16  Lipid panel  Result Value Ref Range   Cholesterol, Total 137 100 - 199 mg/dL   Triglycerides 914197 (H) 0 - 149 mg/dL   HDL 40 >78>39 mg/dL   VLDL Cholesterol Cal 39 5 - 40 mg/dL   LDL Calculated 58 0 - 99 mg/dL   Chol/HDL Ratio 3.4 0.0 - 5.0 ratio units  Hepatic function panel  Result Value Ref Range   Total Protein 7.4 6.0 - 8.5 g/dL   Albumin 4.4 3.6 - 4.8 g/dL   Bilirubin Total 0.3 0.0 - 1.2 mg/dL   Bilirubin, Direct 2.950.09 0.00 - 0.40 mg/dL   Alkaline Phosphatase 71 39 - 117 IU/L   AST 26 0 - 40 IU/L   ALT 26 0 - 44 IU/L  Glucose, random  Result Value Ref Range   Glucose 99 65 - 99 mg/dL  Testosterone  Result Value Ref Range   Testosterone 314 (L) 348 - 1197 ng/dL   Comment, Testosterone Comment    Exercises probably six ot of seven   Watching diet closely   Hx of fatigue off and on over the years  Last colon test sevene yrs  Review of Systems  Constitutional: Negative for fever, activity change and appetite change.  HENT: Negative for congestion and rhinorrhea.   Eyes: Negative for discharge.  Respiratory: Negative for cough and wheezing.   Cardiovascular: Negative for chest pain.  Gastrointestinal: Negative for vomiting, abdominal pain and blood in stool.  Genitourinary: Negative for frequency and difficulty urinating.    Musculoskeletal: Negative for neck pain.  Skin: Negative for rash.  Allergic/Immunologic: Negative for environmental allergies and food allergies.  Neurological: Negative for weakness and headaches.  Psychiatric/Behavioral: Negative for agitation.  All other systems reviewed and are negative.      Objective:   Physical Exam  Constitutional: He appears well-developed and well-nourished.  HENT:  Head: Normocephalic and atraumatic.  Right Ear: External ear normal.  Left Ear: External ear normal.  Nose: Nose normal.  Mouth/Throat: Oropharynx is clear and moist.  Eyes: EOM are normal. Pupils are equal, round, and reactive to light.  Neck: Normal range of motion. Neck supple. No thyromegaly present.  Cardiovascular: Normal rate, regular rhythm and normal heart sounds.   No murmur heard. Pulmonary/Chest: Effort normal and breath sounds normal. No respiratory distress. He has no wheezes.  Abdominal: Soft. Bowel sounds are normal. He exhibits no distension and no mass. There is no tenderness.  Genitourinary: Penis normal.  Musculoskeletal: Normal range of motion. He exhibits no edema.  Lymphadenopathy:    He has no cervical adenopathy.  Neurological: He is alert. He exhibits normal muscle tone.  Skin: Skin is warm and dry. No erythema.  Psychiatric: He has a normal mood and affect. His behavior is normal. Judgment normal.  Vitals reviewed.   Prostate within normal  limits.      Assessment & Plan:  Impression 1 wellness exam #2 hyperlipidemia controlled good on current meds #3 hypertension controlled good on current meds #4 depression/anxiety sees a psychiatrist for this report stable and diet exercise discussed. Chronic medications refilled. Hemoccult cards today. Up-to-date on colonoscopy. WSL

## 2016-06-22 NOTE — Patient Instructions (Signed)
Results for orders placed or performed in visit on 06/01/16  Lipid panel  Result Value Ref Range   Cholesterol, Total 137 100 - 199 mg/dL   Triglycerides 147197 (H) 0 - 149 mg/dL   HDL 40 >82>39 mg/dL   VLDL Cholesterol Cal 39 5 - 40 mg/dL   LDL Calculated 58 0 - 99 mg/dL   Chol/HDL Ratio 3.4 0.0 - 5.0 ratio units  Hepatic function panel  Result Value Ref Range   Total Protein 7.4 6.0 - 8.5 g/dL   Albumin 4.4 3.6 - 4.8 g/dL   Bilirubin Total 0.3 0.0 - 1.2 mg/dL   Bilirubin, Direct 9.560.09 0.00 - 0.40 mg/dL   Alkaline Phosphatase 71 39 - 117 IU/L   AST 26 0 - 40 IU/L   ALT 26 0 - 44 IU/L  Glucose, random  Result Value Ref Range   Glucose 99 65 - 99 mg/dL  Testosterone  Result Value Ref Range   Testosterone 314 (L) 348 - 1197 ng/dL   Comment, Testosterone Comment

## 2016-09-08 ENCOUNTER — Ambulatory Visit (INDEPENDENT_AMBULATORY_CARE_PROVIDER_SITE_OTHER): Payer: Medicare Other | Admitting: Family Medicine

## 2016-09-08 ENCOUNTER — Encounter: Payer: Self-pay | Admitting: Family Medicine

## 2016-09-08 VITALS — BP 130/84 | Temp 98.0°F | Ht 66.0 in | Wt 203.4 lb

## 2016-09-08 DIAGNOSIS — G8929 Other chronic pain: Secondary | ICD-10-CM | POA: Diagnosis not present

## 2016-09-08 DIAGNOSIS — R1030 Lower abdominal pain, unspecified: Secondary | ICD-10-CM | POA: Diagnosis not present

## 2016-09-08 NOTE — Progress Notes (Signed)
   Subjective:    Patient ID: Devin Daugherty, male    DOB: November 15, 1951, 65 y.o.   MRN: [id number]  HPI Patient in today for swelling in creases of bilateral legs. Onset a few years ago. Patient states recurrent problem. Has seen specialist in the past.   States no other concerns this visit.   SWELLING IN THE CREASE OF THE LEGS AND GROIN  HX OF STRAIN IN THE GROIN AREA  NO POAIN   BUT UNCOMFORTABLE Pt declines flu shot    Review of Systems    No headache, no major weight loss or weight gain, no chest pain no back pain abdominal pain no change in bowel habits complete ROS otherwise negative  Objective:   Physical Exam  Alert vitals stable, NAD. Blood pressure good on repeat. HEENT normal. Lungs clear. Heart regular rate and rhythm.Testicles normal inguinal region normal no adenopathy no masses all completely normal anxious appearing  a      Assessment & Plan:  Impression 1 impression 1 chronic groin strain complicated by patient's high anxiety regarding this. No evidence of mass. Has been scanned in the past. Offered referral back to specialist but for now patient elects to hold off which I think is reasonable

## 2016-11-15 DIAGNOSIS — F3341 Major depressive disorder, recurrent, in partial remission: Secondary | ICD-10-CM | POA: Diagnosis not present

## 2016-11-15 DIAGNOSIS — F411 Generalized anxiety disorder: Secondary | ICD-10-CM | POA: Diagnosis not present

## 2016-12-09 ENCOUNTER — Telehealth: Payer: Self-pay | Admitting: Family Medicine

## 2016-12-09 DIAGNOSIS — E785 Hyperlipidemia, unspecified: Secondary | ICD-10-CM

## 2016-12-09 DIAGNOSIS — Z79899 Other long term (current) drug therapy: Secondary | ICD-10-CM

## 2016-12-09 NOTE — Telephone Encounter (Signed)
Lip liv 

## 2016-12-09 NOTE — Telephone Encounter (Signed)
Pt is requesting lab orders to be sent over for an upcoming appt. Last labs per epic were: testosterone,glucose random,hepatic,and lipid on 06/14/16.

## 2016-12-10 NOTE — Telephone Encounter (Signed)
Left message on voicemail notifying patient that blood work has been ordered.  

## 2016-12-21 DIAGNOSIS — E785 Hyperlipidemia, unspecified: Secondary | ICD-10-CM | POA: Diagnosis not present

## 2016-12-21 DIAGNOSIS — Z79899 Other long term (current) drug therapy: Secondary | ICD-10-CM | POA: Diagnosis not present

## 2016-12-22 ENCOUNTER — Ambulatory Visit: Payer: Medicare Other | Admitting: Family Medicine

## 2016-12-22 LAB — HEPATIC FUNCTION PANEL
ALT: 27 IU/L (ref 0–44)
AST: 21 IU/L (ref 0–40)
Albumin: 4.4 g/dL (ref 3.6–4.8)
Alkaline Phosphatase: 68 IU/L (ref 39–117)
Bilirubin Total: 0.3 mg/dL (ref 0.0–1.2)
Bilirubin, Direct: 0.09 mg/dL (ref 0.00–0.40)
Total Protein: 7.4 g/dL (ref 6.0–8.5)

## 2016-12-22 LAB — LIPID PANEL
Chol/HDL Ratio: 3.9 ratio (ref 0.0–5.0)
Cholesterol, Total: 160 mg/dL (ref 100–199)
HDL: 41 mg/dL
LDL Calculated: 80 mg/dL (ref 0–99)
Triglycerides: 194 mg/dL — ABNORMAL HIGH (ref 0–149)
VLDL Cholesterol Cal: 39 mg/dL (ref 5–40)

## 2017-01-03 ENCOUNTER — Ambulatory Visit: Payer: Medicare Other | Admitting: Family Medicine

## 2017-01-05 ENCOUNTER — Ambulatory Visit (INDEPENDENT_AMBULATORY_CARE_PROVIDER_SITE_OTHER): Payer: Medicare Other | Admitting: Family Medicine

## 2017-01-05 ENCOUNTER — Encounter: Payer: Self-pay | Admitting: Family Medicine

## 2017-01-05 VITALS — Ht 66.0 in

## 2017-01-05 DIAGNOSIS — E785 Hyperlipidemia, unspecified: Secondary | ICD-10-CM | POA: Diagnosis not present

## 2017-01-05 DIAGNOSIS — I10 Essential (primary) hypertension: Secondary | ICD-10-CM | POA: Diagnosis not present

## 2017-01-05 MED ORDER — ROSUVASTATIN CALCIUM 40 MG PO TABS: 40.0000 mg | ORAL_TABLET | Freq: Every day | ORAL | 1 refills | Status: DC

## 2017-01-05 MED ORDER — BENAZEPRIL HCL 10 MG PO TABS: 10.0000 mg | ORAL_TABLET | Freq: Every day | ORAL | 1 refills | Status: DC

## 2017-01-05 NOTE — Progress Notes (Signed)
   Subjective:    Patient ID: Devin Daugherty, male    DOB: 1951-11-20, [age] y.o.   MRN: [id number]  Hypertension  This is a chronic problem. The current episode started more than 1 year ago. The problem is unchanged. There are no associated agents to hypertension. There are no known risk factors for coronary artery disease. Treatments tried: benazapril. The current treatment provides moderate improvement. There are no compliance problems.    Patient has no other concerns at this time.   Results for orders placed or performed in visit on 12/09/16  Lipid panel  Result Value Ref Range   Cholesterol, Total 160 100 - 199 mg/dL   Triglycerides 409194 (H) 0 - 149 mg/dL   HDL 41 >81>39 mg/dL   VLDL Cholesterol Cal 39 5 - 40 mg/dL   LDL Calculated 80 0 - 99 mg/dL   Chol/HDL Ratio 3.9 0.0 - 5.0 ratio units  Hepatic function panel  Result Value Ref Range   Total Protein 7.4 6.0 - 8.5 g/dL   Albumin 4.4 3.6 - 4.8 g/dL   Bilirubin Total 0.3 0.0 - 1.2 mg/dL   Bilirubin, Direct 1.910.09 0.00 - 0.40 mg/dL   Alkaline Phosphatase 68 39 - 117 IU/L   AST 21 0 - 40 IU/L   ALT 27 0 - 44 IU/L   Blood pressure medicine and blood pressure levels reviewed today with patient. Compliant with blood pressure medicine. States does not miss a dose. No obvious side effects. Blood pressure generally good when checked elsewhere. Watching salt intake.   Patient compliant with insomnia medication. Generally takes most nights. No obvious morning drowsiness. Definitely helps patient sleep. Without it patient states would not get a good nights rest.   Review of Systems No headache, no major weight loss or weight gain, no chest pain no back pain abdominal pain no change in bowel habits complete ROS otherwise negative     Objective:   Physical Exam  Alert vitals stable, NAD. Blood pressure good on repeat. HEENT normal. Lungs clear. Heart regular rate and rhythm.       Assessment & Plan:  Impression hypertension good  control discussed maintain same meds #2 hyperlipidemia good control discussed maintain same meds plan diet exercise discussed medications refilled recheck in 6 months for wellness plus chronic visit [facility]

## 2017-04-04 ENCOUNTER — Telehealth: Payer: Self-pay | Admitting: Family Medicine

## 2017-04-04 MED ORDER — RANITIDINE HCL 300 MG PO TABS: 300.0000 mg | ORAL_TABLET | Freq: Every day | ORAL | 11 refills | Status: DC

## 2017-04-04 NOTE — Telephone Encounter (Signed)
Numb 30 one qam ref times 11

## 2017-04-04 NOTE — Telephone Encounter (Signed)
Not on med list need directions please

## 2017-04-04 NOTE — Telephone Encounter (Signed)
Prescription sent electronically to pharmacy. Patient notified. 

## 2017-04-04 NOTE — Telephone Encounter (Signed)
Requesting refill for ranitidine (ZANTAC) 300 MG tablet.  [facility]

## 2017-05-16 DIAGNOSIS — F3341 Major depressive disorder, recurrent, in partial remission: Secondary | ICD-10-CM | POA: Diagnosis not present

## 2017-05-16 DIAGNOSIS — F411 Generalized anxiety disorder: Secondary | ICD-10-CM | POA: Diagnosis not present

## 2017-06-08 ENCOUNTER — Telehealth: Payer: Self-pay | Admitting: Family Medicine

## 2017-06-08 NOTE — Telephone Encounter (Signed)
Stress anxiety nervousness is not neurology but psychiatry  wht didn't [place] physician go ahead and make referral if he thinks pt needs a neurologist?

## 2017-06-08 NOTE — Telephone Encounter (Signed)
Pt is requesting a neurology referral for stress,anxiety and nervousness. [place].

## 2017-06-08 NOTE — Telephone Encounter (Signed)
Spoke with patient and patient stated that the doctor he is seeing currently is a Therapist, sportspsychiatrist. He states he is unaware as to why he needs to see neurology. He stated that he will call and get more information as to why the referral is necessary.

## 2017-06-16 ENCOUNTER — Telehealth: Payer: Self-pay | Admitting: Family Medicine

## 2017-06-16 DIAGNOSIS — I10 Essential (primary) hypertension: Secondary | ICD-10-CM

## 2017-06-16 DIAGNOSIS — E7800 Pure hypercholesterolemia, unspecified: Secondary | ICD-10-CM

## 2017-06-16 DIAGNOSIS — Z125 Encounter for screening for malignant neoplasm of prostate: Secondary | ICD-10-CM

## 2017-06-16 NOTE — Telephone Encounter (Signed)
Pt is needing lab orders sent over for an appt coming up on 07/04/17.

## 2017-06-17 NOTE — Telephone Encounter (Signed)
Blood work ordered in EPIC. Patient notified. 

## 2017-06-17 NOTE — Telephone Encounter (Signed)
Lip liv m7 psa 

## 2017-06-18 DIAGNOSIS — I10 Essential (primary) hypertension: Secondary | ICD-10-CM | POA: Diagnosis not present

## 2017-06-18 DIAGNOSIS — E7800 Pure hypercholesterolemia, unspecified: Secondary | ICD-10-CM | POA: Diagnosis not present

## 2017-06-18 DIAGNOSIS — Z125 Encounter for screening for malignant neoplasm of prostate: Secondary | ICD-10-CM | POA: Diagnosis not present

## 2017-06-20 LAB — HEPATIC FUNCTION PANEL
ALBUMIN: 4.5 g/dL (ref 3.6–4.8)
ALT: 34 IU/L (ref 0–44)
AST: 35 IU/L (ref 0–40)
Alkaline Phosphatase: 75 IU/L (ref 39–117)
BILIRUBIN TOTAL: 0.3 mg/dL (ref 0.0–1.2)
Bilirubin, Direct: 0.09 mg/dL (ref 0.00–0.40)
TOTAL PROTEIN: 7.5 g/dL (ref 6.0–8.5)

## 2017-06-20 LAB — LIPID PANEL
CHOL/HDL RATIO: 3.3 ratio (ref 0.0–5.0)
CHOLESTEROL TOTAL: 133 mg/dL (ref 100–199)
HDL: 40 mg/dL (ref >39)
LDL CALC: 66 mg/dL (ref 0–99)
Triglycerides: 137 mg/dL (ref 0–149)
VLDL CHOLESTEROL CAL: 27 mg/dL (ref 5–40)

## 2017-06-20 LAB — BASIC METABOLIC PANEL
BUN/Creatinine Ratio: 13 (ref 10–24)
BUN: 17 mg/dL (ref 8–27)
CALCIUM: 9.7 mg/dL (ref 8.6–10.2)
CO2: 22 mmol/L (ref 20–29)
Chloride: 102 mmol/L (ref 96–106)
Creatinine, Ser: 1.32 mg/dL — ABNORMAL HIGH (ref 0.76–1.27)
GFR calc Af Amer: 64 mL/min/{1.73_m2} (ref >59)
GFR, EST NON AFRICAN AMERICAN: 55 mL/min/{1.73_m2} — AB (ref >59)
Glucose: 97 mg/dL (ref 65–99)
POTASSIUM: 5.6 mmol/L — AB (ref 3.5–5.2)
Sodium: 137 mmol/L (ref 134–144)

## 2017-06-20 LAB — PSA: Prostate Specific Ag, Serum: 1.2 ng/mL (ref 0.0–4.0)

## 2017-07-04 ENCOUNTER — Encounter: Payer: Self-pay | Admitting: Family Medicine

## 2017-07-04 ENCOUNTER — Ambulatory Visit (INDEPENDENT_AMBULATORY_CARE_PROVIDER_SITE_OTHER): Payer: Medicare Other | Admitting: Family Medicine

## 2017-07-04 VITALS — BP 134/78 | Ht 66.0 in | Wt 205.0 lb

## 2017-07-04 DIAGNOSIS — I10 Essential (primary) hypertension: Secondary | ICD-10-CM | POA: Diagnosis not present

## 2017-07-04 DIAGNOSIS — G250 Essential tremor: Secondary | ICD-10-CM

## 2017-07-04 DIAGNOSIS — E7800 Pure hypercholesterolemia, unspecified: Secondary | ICD-10-CM | POA: Diagnosis not present

## 2017-07-04 DIAGNOSIS — Z0000 Encounter for general adult medical examination without abnormal findings: Secondary | ICD-10-CM

## 2017-07-04 MED ORDER — RANITIDINE HCL 300 MG PO TABS: 300.0000 mg | ORAL_TABLET | Freq: Every day | ORAL | 1 refills | Status: DC

## 2017-07-04 MED ORDER — BENAZEPRIL HCL 10 MG PO TABS: 10.0000 mg | ORAL_TABLET | Freq: Every day | ORAL | 1 refills | Status: DC

## 2017-07-04 MED ORDER — ROSUVASTATIN CALCIUM 40 MG PO TABS: 40.0000 mg | ORAL_TABLET | Freq: Every day | ORAL | 1 refills | Status: DC

## 2017-07-04 NOTE — Progress Notes (Signed)
Subjective:    Patient ID: Devin Daugherty, male    DOB: 04/03/1951, 66 y.o.   MRN: [id number]  HPI AWV- Annual Wellness Visit  The patient was seen for their annual wellness visit. The patient's past medical history, surgical history, and family history were reviewed. Pertinent vaccines were reviewed ( tetanus, pneumonia, shingles, flu) The patient's medication list was reviewed and updated.  The height and weight were entered. The patient's current BMI is:33.2  Cognitive screening was completed. Outcome of Mini - Cog: passed  Falls within the past 6 months no  Current tobacco usage: no (All patients who use tobacco were given written and verbal information on quitting)  Recent listing of emergency department/hospitalizations over the past year were reviewed.  current specialist the patient sees on a regular basis: no   Medicare annual wellness visit patient questionnaire was reviewed.  A written screening schedule for the patient for the next 5-10 years was given. Appropriate discussion of followup regarding next visit was discussed.  Blood pressure medicine and blood pressure levels reviewed today with patient. Compliant with blood pressure medicine. States does not miss a dose. No obvious side effects. Blood pressure generally good when checked elsewhere. Watching salt intake.   Patient continues to take lipid medication regularly. No obvious side effects from it. Generally does not miss a dose. Prior blood work results are reviewed with patient. Patient continues to work on fat intake in diet  Results for orders placed or performed in visit on 06/16/17  Lipid panel  Result Value Ref Range   Cholesterol, Total 133 100 - 199 mg/dL   Triglycerides 725 0 - 149 mg/dL   HDL 40 >36 mg/dL   VLDL Cholesterol Cal 27 5 - 40 mg/dL   LDL Calculated 66 0 - 99 mg/dL   Chol/HDL Ratio 3.3 0.0 - 5.0 ratio  Hepatic function panel  Result Value Ref Range   Total Protein 7.5 6.0 - 8.5  g/dL   Albumin 4.5 3.6 - 4.8 g/dL   Bilirubin Total 0.3 0.0 - 1.2 mg/dL   Bilirubin, Direct 6.44 0.00 - 0.40 mg/dL   Alkaline Phosphatase 75 39 - 117 IU/L   AST 35 0 - 40 IU/L   ALT 34 0 - 44 IU/L  Basic metabolic panel  Result Value Ref Range   Glucose 97 65 - 99 mg/dL   BUN 17 8 - 27 mg/dL   Creatinine, Ser 0.34 (H) 0.76 - 1.27 mg/dL   GFR calc non Af Amer 55 (L) >59 mL/min/1.73   GFR calc Af Amer 64 >59 mL/min/1.73   BUN/Creatinine Ratio 13 10 - 24   Sodium 137 134 - 144 mmol/L   Potassium 5.6 (H) 3.5 - 5.2 mmol/L   Chloride 102 96 - 106 mmol/L   CO2 22 20 - 29 mmol/L   Calcium 9.7 8.6 - 10.2 mg/dL  PSA  Result Value Ref Range   Prostate Specific Ag, Serum 1.2 0.0 - 4.0 ng/mL     No complaints, but is seeing physiatrist  for anxiety.  Patient was advised by a psychiatrist that he needed to see a neurologist for his tremor. Patient thinks it is all due to his nerves.   Review of Systems  Constitutional: Negative for activity change, appetite change and fever.  HENT: Negative for congestion and rhinorrhea.   Eyes: Negative for discharge.  Respiratory: Negative for cough and wheezing.   Cardiovascular: Negative for chest pain.  Gastrointestinal: Negative for abdominal pain, blood in stool  and vomiting.  Genitourinary: Negative for difficulty urinating and frequency.  Musculoskeletal: Negative for neck pain.  Skin: Negative for rash.  Allergic/Immunologic: Negative for environmental allergies and food allergies.  Neurological: Negative for weakness and headaches.  Psychiatric/Behavioral: Negative for agitation.  All other systems reviewed and are negative.      Objective:   Physical Exam  Constitutional: He appears well-developed and well-nourished.  HENT:  Head: Normocephalic and atraumatic.  Right Ear: External ear normal.  Left Ear: External ear normal.  Nose: Nose normal.  Mouth/Throat: Oropharynx is clear and moist.  Eyes: EOM are normal. Pupils are equal,  round, and reactive to light.  Neck: Normal range of motion. Neck supple. No thyromegaly present.  Cardiovascular: Normal rate, regular rhythm and normal heart sounds.   No murmur heard. Pulmonary/Chest: Effort normal and breath sounds normal. No respiratory distress. He has no wheezes.  Abdominal: Soft. Bowel sounds are normal. He exhibits no distension and no mass. There is no tenderness.  Genitourinary: Penis normal.  Musculoskeletal: Normal range of motion. He exhibits no edema.  Lymphadenopathy:    He has no cervical adenopathy.  Neurological: He is alert. He exhibits normal muscle tone.  Skin: Skin is warm and dry. No erythema.  Psychiatric: He has a normal mood and affect. His behavior is normal. Judgment normal.  Vitals reviewed.  Neurological exam fine tremor noted in the hands. Negative cerebellar findings. Negative muscle weakness negative bradycardia K Micha negative cogwheeling reflexes normal       Assessment & Plan:  1 wellness exam up to date on colonoscopy. Diet exercise discussed. General concerns discussed #2 hypertension good control discussed maintain same meds #3 hyperlipidemia good control discussed maintain same meds her for essential tremor new diagnosis. Discussed at great length. They can worsen during periods of anxiety. Do not feel neurology workup warranted at this time. Educational information given.

## 2017-07-04 NOTE — Patient Instructions (Addendum)
Results for orders placed or performed in visit on 06/16/17  Lipid panel  Result Value Ref Range   Cholesterol, Total 133 100 - 199 mg/dL   Triglycerides 161137 0 - 149 mg/dL   HDL 40 >09>39 mg/dL   VLDL Cholesterol Cal 27 5 - 40 mg/dL   LDL Calculated 66 0 - 99 mg/dL   Chol/HDL Ratio 3.3 0.0 - 5.0 ratio  Hepatic function panel  Result Value Ref Range   Total Protein 7.5 6.0 - 8.5 g/dL   Albumin 4.5 3.6 - 4.8 g/dL   Bilirubin Total 0.3 0.0 - 1.2 mg/dL   Bilirubin, Direct 6.040.09 0.00 - 0.40 mg/dL   Alkaline Phosphatase 75 39 - 117 IU/L   AST 35 0 - 40 IU/L   ALT 34 0 - 44 IU/L  Basic metabolic panel  Result Value Ref Range   Glucose 97 65 - 99 mg/dL   BUN 17 8 - 27 mg/dL   Creatinine, Ser 5.401.32 (H) 0.76 - 1.27 mg/dL   GFR calc non Af Amer 55 (L) >59 mL/min/1.73   GFR calc Af Amer 64 >59 mL/min/1.73   BUN/Creatinine Ratio 13 10 - 24   Sodium 137 134 - 144 mmol/L   Potassium 5.6 (H) 3.5 - 5.2 mmol/L   Chloride 102 96 - 106 mmol/L   CO2 22 20 - 29 mmol/L   Calcium 9.7 8.6 - 10.2 mg/dL  PSA  Result Value Ref Range   Prostate Specific Ag, Serum 1.2 0.0 - 4.0 ng/mL     Essential Tremor A tremor is trembling or shaking that you cannot control. Most tremors affect the hands or arms. Tremors can also affect the head, vocal cords, face, and other parts of the body. Essential tremor is a tremor without a known cause. What are the causes? Essential tremor has no known cause. What increases the risk? You may be at greater risk of essential tremor if:  You have a family member with essential tremor.  You are age 68 or older.  You take certain medicines.  What are the signs or symptoms? The main sign of a tremor is uncontrolled and unintentional rhythmic shaking of a body part.  You may have difficulty eating with a spoon or fork.  You may have difficulty writing.  You may nod your head up and down or side to side.  You may have a quivering voice.  Your tremors:  May get worse  over time.  May come and go.  May be more noticeable on one side of your body.  May get worse due to stress, fatigue, caffeine, and extreme heat or cold.  How is this diagnosed? Your health care provider can diagnose essential tremor based on your symptoms, medical history, and a physical examination. There is no single test to diagnose an essential tremor. However, your health care provider may perform a variety of tests to rule out other conditions. Tests may include:  Blood and urine tests.  Imaging studies of your brain, such as: ? CT scan. ? MRI.  A test that measures involuntary muscle movement (electromyogram).  How is this treated? Your tremors may go away without treatment. Mild tremors may not need treatment if they do not affect your day-to-day life. Severe tremors may need to be treated using one or a combination of the following options:  Medicines. This may include medicine that is injected.  Lifestyle changes.  Physical therapy.  Follow these instructions at home:  Take medicines only as directed  by your health care provider.  Limit alcohol intake to no more than 1 drink per day for nonpregnant women and 2 drinks per day for men. One drink equals 12 oz of beer, 5 oz of wine, or 1 oz of hard liquor.  Do not use any tobacco products, including cigarettes, chewing tobacco, or electronic cigarettes. If you need help quitting, ask your health care provider.  Take medicines only as directed by your health care provider.  Avoid extreme heat or cold.  Limit the amount of caffeine you consumeas directed by your health care provider.  Try to get eight hours of sleep each night.  Find ways to manage your stress, such as meditation or yoga.  Keep all follow-up visits as directed by your health care provider. This is important. This includes any physical therapy visits. Contact a health care provider if:  You experience any changes in the location or intensity of  your tremors.  You start having a tremor after starting a new medicine.  You have tremor with other symptoms such as: ? Numbness. ? Tingling. ? Pain. ? Weakness.  Your tremor gets worse.  Your tremor interferes with your daily life. This information is not intended to replace advice given to you by your health care provider. Make sure you discuss any questions you have with your health care provider. Document Released: 01/03/2015 Document Revised: 05/20/2016 Document Reviewed: 06/10/2014 Elsevier Interactive Patient Education  [employer].

## 2017-08-04 ENCOUNTER — Other Ambulatory Visit: Payer: Self-pay | Admitting: Family Medicine

## 2017-08-18 ENCOUNTER — Other Ambulatory Visit: Payer: Self-pay | Admitting: Family Medicine

## 2017-08-22 DIAGNOSIS — F10129 Alcohol abuse with intoxication, unspecified: Secondary | ICD-10-CM | POA: Diagnosis not present

## 2017-08-22 DIAGNOSIS — F411 Generalized anxiety disorder: Secondary | ICD-10-CM | POA: Diagnosis not present

## 2017-08-22 DIAGNOSIS — F332 Major depressive disorder, recurrent severe without psychotic features: Secondary | ICD-10-CM | POA: Diagnosis not present

## 2017-08-22 DIAGNOSIS — R55 Syncope and collapse: Secondary | ICD-10-CM | POA: Diagnosis not present

## 2017-08-22 DIAGNOSIS — R404 Transient alteration of awareness: Secondary | ICD-10-CM | POA: Diagnosis not present

## 2017-08-22 DIAGNOSIS — Z885 Allergy status to narcotic agent status: Secondary | ICD-10-CM | POA: Diagnosis not present

## 2017-08-22 DIAGNOSIS — Z79899 Other long term (current) drug therapy: Secondary | ICD-10-CM | POA: Diagnosis not present

## 2017-08-22 DIAGNOSIS — I2510 Atherosclerotic heart disease of native coronary artery without angina pectoris: Secondary | ICD-10-CM | POA: Diagnosis not present

## 2017-08-22 DIAGNOSIS — I10 Essential (primary) hypertension: Secondary | ICD-10-CM | POA: Diagnosis not present

## 2017-08-22 DIAGNOSIS — F419 Anxiety disorder, unspecified: Secondary | ICD-10-CM | POA: Diagnosis not present

## 2017-09-23 DIAGNOSIS — R001 Bradycardia, unspecified: Secondary | ICD-10-CM | POA: Diagnosis not present

## 2017-09-23 DIAGNOSIS — F332 Major depressive disorder, recurrent severe without psychotic features: Secondary | ICD-10-CM | POA: Diagnosis not present

## 2017-09-23 DIAGNOSIS — Z885 Allergy status to narcotic agent status: Secondary | ICD-10-CM | POA: Diagnosis not present

## 2017-09-23 DIAGNOSIS — I2510 Atherosclerotic heart disease of native coronary artery without angina pectoris: Secondary | ICD-10-CM | POA: Diagnosis not present

## 2017-09-23 DIAGNOSIS — Z79899 Other long term (current) drug therapy: Secondary | ICD-10-CM | POA: Diagnosis not present

## 2017-09-23 DIAGNOSIS — F419 Anxiety disorder, unspecified: Secondary | ICD-10-CM | POA: Diagnosis not present

## 2017-09-23 DIAGNOSIS — I10 Essential (primary) hypertension: Secondary | ICD-10-CM | POA: Diagnosis not present

## 2017-09-23 DIAGNOSIS — F411 Generalized anxiety disorder: Secondary | ICD-10-CM | POA: Diagnosis not present

## 2017-09-23 DIAGNOSIS — R9431 Abnormal electrocardiogram [ECG] [EKG]: Secondary | ICD-10-CM | POA: Diagnosis not present

## 2017-09-23 DIAGNOSIS — Z955 Presence of coronary angioplasty implant and graft: Secondary | ICD-10-CM | POA: Diagnosis not present

## 2017-09-23 DIAGNOSIS — F410 Panic disorder [episodic paroxysmal anxiety] without agoraphobia: Secondary | ICD-10-CM | POA: Diagnosis not present

## 2017-09-24 DIAGNOSIS — I10 Essential (primary) hypertension: Secondary | ICD-10-CM | POA: Diagnosis not present

## 2017-09-24 DIAGNOSIS — R001 Bradycardia, unspecified: Secondary | ICD-10-CM | POA: Diagnosis not present

## 2017-09-24 DIAGNOSIS — F331 Major depressive disorder, recurrent, moderate: Secondary | ICD-10-CM | POA: Diagnosis not present

## 2017-09-24 DIAGNOSIS — F411 Generalized anxiety disorder: Secondary | ICD-10-CM | POA: Diagnosis not present

## 2017-09-25 DIAGNOSIS — R001 Bradycardia, unspecified: Secondary | ICD-10-CM | POA: Diagnosis not present

## 2017-09-25 DIAGNOSIS — F411 Generalized anxiety disorder: Secondary | ICD-10-CM | POA: Diagnosis not present

## 2017-09-25 DIAGNOSIS — F331 Major depressive disorder, recurrent, moderate: Secondary | ICD-10-CM | POA: Diagnosis not present

## 2017-09-25 DIAGNOSIS — I10 Essential (primary) hypertension: Secondary | ICD-10-CM | POA: Diagnosis not present

## 2017-09-28 DIAGNOSIS — F332 Major depressive disorder, recurrent severe without psychotic features: Secondary | ICD-10-CM | POA: Diagnosis not present

## 2017-11-15 ENCOUNTER — Telehealth: Payer: Self-pay

## 2017-11-15 NOTE — Telephone Encounter (Signed)
Pt wife called states pt is having some bilateral upper leg/groin swelling which he has had off and on for several years. The last time he was seen was 08/2016, we referred to a specialist which the wife does not remember who it was. She states he is not in any pain,but it is uncomfortable when he wears his clothes.Pt wife requested an appt for this Friday,but Dr Brett CanalesSteve is not here that day and since it has been an off and on thing for over a year he suggest having pt come in next week for a follow up. Wife switched up front for an appt.

## 2017-11-21 ENCOUNTER — Encounter: Payer: Self-pay | Admitting: Family Medicine

## 2017-11-21 ENCOUNTER — Ambulatory Visit: Payer: Medicare Other | Admitting: Family Medicine

## 2017-11-21 VITALS — BP 152/90 | Temp 97.6°F | Ht 66.0 in | Wt 214.0 lb

## 2017-11-21 DIAGNOSIS — M25552 Pain in left hip: Secondary | ICD-10-CM

## 2017-11-21 MED ORDER — DICLOFENAC SODIUM 75 MG PO TBEC: DELAYED_RELEASE_TABLET | ORAL | 2 refills | Status: DC

## 2017-11-21 NOTE — Progress Notes (Signed)
   Subjective:    Patient ID: Devin Daugherty, male    DOB: 1951-12-01, 66 y.o.   MRN: [id number]  HPI Patient is here today to follow up on left side groin swelling.Pain in left hip area. Has been seen here for this in the past and was referred to urologist per the pt.  Discomfort gets wotrse. When working has discomfort  Patient arrives with 2 chief concerns which he thinks may be related.  Chronic swelling in the left groin region.  Occurs off and on.  Worse with heavy work.  We address this once before.  Actually went for a full workup and a CT scan which at that time revealed no left inguinal hernia.  Patient also reports left hip pain worse with certain motions flares up when working.  Wonders if these 2 are related  Swells off and on   Notes discomfort and pain    Review of Systems No headache, no major weight loss or weight gain, no chest pain no back pain abdominal pain no change in bowel habits complete ROS otherwise negative     Objective:   Physical Exam  Alert vitals stable, NAD. Blood pressure good on repeat. HEENT normal. Lungs clear. Heart regular rate and rhythm. Testicular exam normal groin no evidence of hernia hip some pain with rotation.  Some anterior hip pain to deep palpation.  No radiation      Assessment & Plan:  Impression 1 chronic groin swelling somewhat a mystery.  No evidence of hernia on exam today no evidence on prior CT scan.  Offered yet another referral patient deferred  2.  Left hip pain likely musculoskeletal left hip x-ray trial Voltaren to use only as needed during flares

## 2017-12-14 ENCOUNTER — Telehealth: Payer: Self-pay | Admitting: Family Medicine

## 2017-12-14 DIAGNOSIS — E785 Hyperlipidemia, unspecified: Secondary | ICD-10-CM

## 2017-12-14 DIAGNOSIS — Z79899 Other long term (current) drug therapy: Secondary | ICD-10-CM

## 2017-12-14 NOTE — Telephone Encounter (Signed)
Lip liv m7 

## 2017-12-14 NOTE — Telephone Encounter (Signed)
Patient has an appointment on 01/04/18 with Dr. Brett CanalesSteve.  He is requesting orders for labs.

## 2017-12-14 NOTE — Telephone Encounter (Signed)
Patient had Lipid, Liver, Met 7 and PSA 05/2017

## 2017-12-15 NOTE — Telephone Encounter (Signed)
Blood work ordered in Epic. Patient notified. 

## 2017-12-22 DIAGNOSIS — Z79899 Other long term (current) drug therapy: Secondary | ICD-10-CM | POA: Diagnosis not present

## 2017-12-22 DIAGNOSIS — E785 Hyperlipidemia, unspecified: Secondary | ICD-10-CM | POA: Diagnosis not present

## 2017-12-23 LAB — LIPID PANEL
Chol/HDL Ratio: 4.2 ratio (ref 0.0–5.0)
Cholesterol, Total: 155 mg/dL (ref 100–199)
HDL: 37 mg/dL — ABNORMAL LOW (ref >39)
LDL Calculated: 71 mg/dL (ref 0–99)
Triglycerides: 236 mg/dL — ABNORMAL HIGH (ref 0–149)
VLDL CHOLESTEROL CAL: 47 mg/dL — AB (ref 5–40)

## 2017-12-23 LAB — BASIC METABOLIC PANEL
BUN/Creatinine Ratio: 12 (ref 10–24)
BUN: 13 mg/dL (ref 8–27)
CALCIUM: 9.4 mg/dL (ref 8.6–10.2)
CO2: 21 mmol/L (ref 20–29)
CREATININE: 1.12 mg/dL (ref 0.76–1.27)
Chloride: 110 mmol/L — ABNORMAL HIGH (ref 96–106)
GFR calc Af Amer: 78 mL/min/{1.73_m2} (ref >59)
GFR, EST NON AFRICAN AMERICAN: 67 mL/min/{1.73_m2} (ref >59)
Glucose: 102 mg/dL — ABNORMAL HIGH (ref 65–99)
POTASSIUM: 4.5 mmol/L (ref 3.5–5.2)
Sodium: 143 mmol/L (ref 134–144)

## 2017-12-23 LAB — HEPATIC FUNCTION PANEL
ALBUMIN: 4.1 g/dL (ref 3.6–4.8)
ALT: 18 IU/L (ref 0–44)
AST: 21 IU/L (ref 0–40)
Alkaline Phosphatase: 72 IU/L (ref 39–117)
Bilirubin Total: 0.2 mg/dL (ref 0.0–1.2)
Bilirubin, Direct: 0.07 mg/dL (ref 0.00–0.40)
TOTAL PROTEIN: 7.5 g/dL (ref 6.0–8.5)

## 2018-01-02 ENCOUNTER — Ambulatory Visit: Payer: Medicare Other | Admitting: Family Medicine

## 2018-01-02 ENCOUNTER — Encounter: Payer: Self-pay | Admitting: Family Medicine

## 2018-01-02 VITALS — BP 130/82 | Ht 66.0 in | Wt 210.2 lb

## 2018-01-02 DIAGNOSIS — F411 Generalized anxiety disorder: Secondary | ICD-10-CM | POA: Diagnosis not present

## 2018-01-02 DIAGNOSIS — F331 Major depressive disorder, recurrent, moderate: Secondary | ICD-10-CM | POA: Diagnosis not present

## 2018-01-02 DIAGNOSIS — I10 Essential (primary) hypertension: Secondary | ICD-10-CM

## 2018-01-02 DIAGNOSIS — E7800 Pure hypercholesterolemia, unspecified: Secondary | ICD-10-CM

## 2018-01-02 MED ORDER — ROSUVASTATIN CALCIUM 40 MG PO TABS: 40.0000 mg | ORAL_TABLET | Freq: Every day | ORAL | 1 refills | Status: DC

## 2018-01-02 MED ORDER — BENAZEPRIL HCL 10 MG PO TABS: 10.0000 mg | ORAL_TABLET | Freq: Every day | ORAL | 1 refills | Status: DC

## 2018-01-02 NOTE — Patient Instructions (Signed)
Results for orders placed or performed in visit on 12/14/17  Lipid panel  Result Value Ref Range   Cholesterol, Total 155 100 - 199 mg/dL   Triglycerides 213236 (H) 0 - 149 mg/dL   HDL 37 (L) >08>39 mg/dL   VLDL Cholesterol Cal 47 (H) 5 - 40 mg/dL   LDL Calculated 71 0 - 99 mg/dL   Chol/HDL Ratio 4.2 0.0 - 5.0 ratio  Hepatic function panel  Result Value Ref Range   Total Protein 7.5 6.0 - 8.5 g/dL   Albumin 4.1 3.6 - 4.8 g/dL   Bilirubin Total 0.2 0.0 - 1.2 mg/dL   Bilirubin, Direct 6.570.07 0.00 - 0.40 mg/dL   Alkaline Phosphatase 72 39 - 117 IU/L   AST 21 0 - 40 IU/L   ALT 18 0 - 44 IU/L  Basic metabolic panel  Result Value Ref Range   Glucose 102 (H) 65 - 99 mg/dL   BUN 13 8 - 27 mg/dL   Creatinine, Ser 8.461.12 0.76 - 1.27 mg/dL   GFR calc non Af Amer 67 >59 mL/min/1.73   GFR calc Af Amer 78 >59 mL/min/1.73   BUN/Creatinine Ratio 12 10 - 24   Sodium 143 134 - 144 mmol/L   Potassium 4.5 3.5 - 5.2 mmol/L   Chloride 110 (H) 96 - 106 mmol/L   CO2 21 20 - 29 mmol/L   Calcium 9.4 8.6 - 10.2 mg/dL

## 2018-01-02 NOTE — Progress Notes (Signed)
   Subjective:    Patient ID: Devin Daugherty, male    DOB: 07-28-1951, 67 y.o.   MRN: [id number]  Hyperlipidemia  This is a chronic problem. The current episode started more than 1 year ago. Treatments tried: crestor. There are no compliance problems.  Risk factors for coronary artery disease include dyslipidemia.   Blood pressure medicine and blood pressure levels reviewed today with patient. Compliant with blood pressure medicine. States does not miss a dose. No obvious side effects. Blood pressure generally good when checked elsewhere. Watching salt intake.   Patient continues to take lipid medication regularly. No obvious side effects from it. Generally does not miss a dose. Prior blood work results are reviewed with patient. Patient continues to work on fat intake in diet   Walking a lot these days  Hip now significantly p beter    Results for orders placed or performed in visit on 12/14/17  Lipid panel  Result Value Ref Range   Cholesterol, Total 155 100 - 199 mg/dL   Triglycerides 782236 (H) 0 - 149 mg/dL   HDL 37 (L) >95>39 mg/dL   VLDL Cholesterol Cal 47 (H) 5 - 40 mg/dL   LDL Calculated 71 0 - 99 mg/dL   Chol/HDL Ratio 4.2 0.0 - 5.0 ratio  Hepatic function panel  Result Value Ref Range   Total Protein 7.5 6.0 - 8.5 g/dL   Albumin 4.1 3.6 - 4.8 g/dL   Bilirubin Total 0.2 0.0 - 1.2 mg/dL   Bilirubin, Direct 6.210.07 0.00 - 0.40 mg/dL   Alkaline Phosphatase 72 39 - 117 IU/L   AST 21 0 - 40 IU/L   ALT 18 0 - 44 IU/L  Basic metabolic panel  Result Value Ref Range   Glucose 102 (H) 65 - 99 mg/dL   BUN 13 8 - 27 mg/dL   Creatinine, Ser 3.081.12 0.76 - 1.27 mg/dL   GFR calc non Af Amer 67 >59 mL/min/1.73   GFR calc Af Amer 78 >59 mL/min/1.73   BUN/Creatinine Ratio 12 10 - 24   Sodium 143 134 - 144 mmol/L   Potassium 4.5 3.5 - 5.2 mmol/L   Chloride 110 (H) 96 - 106 mmol/L   CO2 21 20 - 29 mmol/L   Calcium 9.4 8.6 - 10.2 mg/dL     Review of Systems No headache, no major  weight loss or weight gain, no chest pain no back pain abdominal pain no change in bowel habits complete ROS otherwise negative     Objective:   Physical Exam brother Alert vitals stable, NAD. Blood pressure good on repeat. HEENT normal. Lungs clear. Heart regular rate and rhythm.        Assessment & Plan:  1 impression hypertension.  Good control.  Compliance discussed.  Maintain same meds  2.  Hyperlipidemia.  Discussed to maintain same meds  Diet exercise discussed recheck in 6 months for wellness plus chronic

## 2018-01-04 ENCOUNTER — Ambulatory Visit: Payer: Medicare Other | Admitting: Family Medicine

## 2018-01-16 DIAGNOSIS — G4700 Insomnia, unspecified: Secondary | ICD-10-CM | POA: Diagnosis not present

## 2018-01-16 DIAGNOSIS — F331 Major depressive disorder, recurrent, moderate: Secondary | ICD-10-CM | POA: Diagnosis not present

## 2018-01-16 DIAGNOSIS — F411 Generalized anxiety disorder: Secondary | ICD-10-CM | POA: Diagnosis not present

## 2018-01-30 DIAGNOSIS — F411 Generalized anxiety disorder: Secondary | ICD-10-CM | POA: Diagnosis not present

## 2018-01-30 DIAGNOSIS — F331 Major depressive disorder, recurrent, moderate: Secondary | ICD-10-CM | POA: Diagnosis not present

## 2018-01-30 DIAGNOSIS — G4700 Insomnia, unspecified: Secondary | ICD-10-CM | POA: Diagnosis not present

## 2018-03-06 DIAGNOSIS — F331 Major depressive disorder, recurrent, moderate: Secondary | ICD-10-CM | POA: Diagnosis not present

## 2018-03-06 DIAGNOSIS — F411 Generalized anxiety disorder: Secondary | ICD-10-CM | POA: Diagnosis not present

## 2018-03-13 DIAGNOSIS — F410 Panic disorder [episodic paroxysmal anxiety] without agoraphobia: Secondary | ICD-10-CM | POA: Diagnosis not present

## 2018-03-13 DIAGNOSIS — F331 Major depressive disorder, recurrent, moderate: Secondary | ICD-10-CM | POA: Diagnosis not present

## 2018-03-13 DIAGNOSIS — F411 Generalized anxiety disorder: Secondary | ICD-10-CM | POA: Diagnosis not present

## 2018-03-21 DIAGNOSIS — E559 Vitamin D deficiency, unspecified: Secondary | ICD-10-CM | POA: Diagnosis not present

## 2018-03-21 DIAGNOSIS — E785 Hyperlipidemia, unspecified: Secondary | ICD-10-CM | POA: Diagnosis not present

## 2018-03-21 DIAGNOSIS — F410 Panic disorder [episodic paroxysmal anxiety] without agoraphobia: Secondary | ICD-10-CM | POA: Diagnosis not present

## 2018-03-21 DIAGNOSIS — F332 Major depressive disorder, recurrent severe without psychotic features: Secondary | ICD-10-CM | POA: Diagnosis not present

## 2018-03-21 DIAGNOSIS — F411 Generalized anxiety disorder: Secondary | ICD-10-CM | POA: Diagnosis not present

## 2018-03-22 DIAGNOSIS — F332 Major depressive disorder, recurrent severe without psychotic features: Secondary | ICD-10-CM | POA: Diagnosis not present

## 2018-03-22 DIAGNOSIS — E559 Vitamin D deficiency, unspecified: Secondary | ICD-10-CM | POA: Diagnosis not present

## 2018-03-22 DIAGNOSIS — F411 Generalized anxiety disorder: Secondary | ICD-10-CM | POA: Diagnosis not present

## 2018-03-22 DIAGNOSIS — E785 Hyperlipidemia, unspecified: Secondary | ICD-10-CM | POA: Diagnosis not present

## 2018-03-23 DIAGNOSIS — F411 Generalized anxiety disorder: Secondary | ICD-10-CM | POA: Diagnosis not present

## 2018-03-23 DIAGNOSIS — F410 Panic disorder [episodic paroxysmal anxiety] without agoraphobia: Secondary | ICD-10-CM | POA: Diagnosis not present

## 2018-03-23 DIAGNOSIS — F332 Major depressive disorder, recurrent severe without psychotic features: Secondary | ICD-10-CM | POA: Diagnosis not present

## 2018-03-24 DIAGNOSIS — F411 Generalized anxiety disorder: Secondary | ICD-10-CM | POA: Diagnosis not present

## 2018-03-24 DIAGNOSIS — E559 Vitamin D deficiency, unspecified: Secondary | ICD-10-CM | POA: Diagnosis not present

## 2018-03-24 DIAGNOSIS — F332 Major depressive disorder, recurrent severe without psychotic features: Secondary | ICD-10-CM | POA: Diagnosis not present

## 2018-03-24 DIAGNOSIS — E785 Hyperlipidemia, unspecified: Secondary | ICD-10-CM | POA: Diagnosis not present

## 2018-03-25 DIAGNOSIS — E785 Hyperlipidemia, unspecified: Secondary | ICD-10-CM | POA: Diagnosis not present

## 2018-03-25 DIAGNOSIS — E559 Vitamin D deficiency, unspecified: Secondary | ICD-10-CM | POA: Diagnosis not present

## 2018-03-25 DIAGNOSIS — F332 Major depressive disorder, recurrent severe without psychotic features: Secondary | ICD-10-CM | POA: Diagnosis not present

## 2018-03-25 DIAGNOSIS — F411 Generalized anxiety disorder: Secondary | ICD-10-CM | POA: Diagnosis not present

## 2018-03-26 DIAGNOSIS — E785 Hyperlipidemia, unspecified: Secondary | ICD-10-CM | POA: Diagnosis not present

## 2018-03-26 DIAGNOSIS — F411 Generalized anxiety disorder: Secondary | ICD-10-CM | POA: Diagnosis not present

## 2018-03-26 DIAGNOSIS — F332 Major depressive disorder, recurrent severe without psychotic features: Secondary | ICD-10-CM | POA: Diagnosis not present

## 2018-03-26 DIAGNOSIS — E559 Vitamin D deficiency, unspecified: Secondary | ICD-10-CM | POA: Diagnosis not present

## 2018-03-27 DIAGNOSIS — F411 Generalized anxiety disorder: Secondary | ICD-10-CM | POA: Diagnosis not present

## 2018-03-27 DIAGNOSIS — F410 Panic disorder [episodic paroxysmal anxiety] without agoraphobia: Secondary | ICD-10-CM | POA: Diagnosis not present

## 2018-03-27 DIAGNOSIS — F332 Major depressive disorder, recurrent severe without psychotic features: Secondary | ICD-10-CM | POA: Diagnosis not present

## 2018-03-28 DIAGNOSIS — F410 Panic disorder [episodic paroxysmal anxiety] without agoraphobia: Secondary | ICD-10-CM | POA: Diagnosis not present

## 2018-03-28 DIAGNOSIS — F411 Generalized anxiety disorder: Secondary | ICD-10-CM | POA: Diagnosis not present

## 2018-03-28 DIAGNOSIS — F332 Major depressive disorder, recurrent severe without psychotic features: Secondary | ICD-10-CM | POA: Diagnosis not present

## 2018-03-29 DIAGNOSIS — F411 Generalized anxiety disorder: Secondary | ICD-10-CM | POA: Diagnosis not present

## 2018-03-29 DIAGNOSIS — F410 Panic disorder [episodic paroxysmal anxiety] without agoraphobia: Secondary | ICD-10-CM | POA: Diagnosis not present

## 2018-03-29 DIAGNOSIS — F332 Major depressive disorder, recurrent severe without psychotic features: Secondary | ICD-10-CM | POA: Diagnosis not present

## 2018-03-30 DIAGNOSIS — F410 Panic disorder [episodic paroxysmal anxiety] without agoraphobia: Secondary | ICD-10-CM | POA: Diagnosis not present

## 2018-03-30 DIAGNOSIS — F411 Generalized anxiety disorder: Secondary | ICD-10-CM | POA: Diagnosis not present

## 2018-03-30 DIAGNOSIS — F332 Major depressive disorder, recurrent severe without psychotic features: Secondary | ICD-10-CM | POA: Diagnosis not present

## 2018-03-31 DIAGNOSIS — F410 Panic disorder [episodic paroxysmal anxiety] without agoraphobia: Secondary | ICD-10-CM | POA: Diagnosis not present

## 2018-03-31 DIAGNOSIS — F411 Generalized anxiety disorder: Secondary | ICD-10-CM | POA: Diagnosis not present

## 2018-03-31 DIAGNOSIS — F332 Major depressive disorder, recurrent severe without psychotic features: Secondary | ICD-10-CM | POA: Diagnosis not present

## 2018-04-05 DIAGNOSIS — F332 Major depressive disorder, recurrent severe without psychotic features: Secondary | ICD-10-CM | POA: Diagnosis not present

## 2018-04-05 DIAGNOSIS — K219 Gastro-esophageal reflux disease without esophagitis: Secondary | ICD-10-CM | POA: Diagnosis not present

## 2018-04-05 DIAGNOSIS — I2510 Atherosclerotic heart disease of native coronary artery without angina pectoris: Secondary | ICD-10-CM | POA: Diagnosis not present

## 2018-04-05 DIAGNOSIS — I10 Essential (primary) hypertension: Secondary | ICD-10-CM | POA: Diagnosis not present

## 2018-04-06 DIAGNOSIS — F332 Major depressive disorder, recurrent severe without psychotic features: Secondary | ICD-10-CM | POA: Diagnosis not present

## 2018-04-06 DIAGNOSIS — I10 Essential (primary) hypertension: Secondary | ICD-10-CM | POA: Diagnosis not present

## 2018-04-06 DIAGNOSIS — I2510 Atherosclerotic heart disease of native coronary artery without angina pectoris: Secondary | ICD-10-CM | POA: Diagnosis not present

## 2018-04-06 DIAGNOSIS — F411 Generalized anxiety disorder: Secondary | ICD-10-CM | POA: Diagnosis not present

## 2018-04-10 DIAGNOSIS — I2510 Atherosclerotic heart disease of native coronary artery without angina pectoris: Secondary | ICD-10-CM | POA: Diagnosis not present

## 2018-04-10 DIAGNOSIS — Z955 Presence of coronary angioplasty implant and graft: Secondary | ICD-10-CM | POA: Diagnosis not present

## 2018-04-10 DIAGNOSIS — E785 Hyperlipidemia, unspecified: Secondary | ICD-10-CM | POA: Diagnosis not present

## 2018-04-10 DIAGNOSIS — F332 Major depressive disorder, recurrent severe without psychotic features: Secondary | ICD-10-CM | POA: Diagnosis not present

## 2018-04-12 DIAGNOSIS — K219 Gastro-esophageal reflux disease without esophagitis: Secondary | ICD-10-CM | POA: Diagnosis not present

## 2018-04-12 DIAGNOSIS — F332 Major depressive disorder, recurrent severe without psychotic features: Secondary | ICD-10-CM | POA: Diagnosis not present

## 2018-04-12 DIAGNOSIS — F411 Generalized anxiety disorder: Secondary | ICD-10-CM | POA: Diagnosis not present

## 2018-04-12 DIAGNOSIS — E559 Vitamin D deficiency, unspecified: Secondary | ICD-10-CM | POA: Diagnosis not present

## 2018-04-13 DIAGNOSIS — F411 Generalized anxiety disorder: Secondary | ICD-10-CM | POA: Diagnosis not present

## 2018-04-13 DIAGNOSIS — F332 Major depressive disorder, recurrent severe without psychotic features: Secondary | ICD-10-CM | POA: Diagnosis not present

## 2018-04-13 DIAGNOSIS — I10 Essential (primary) hypertension: Secondary | ICD-10-CM | POA: Diagnosis not present

## 2018-04-13 DIAGNOSIS — I2510 Atherosclerotic heart disease of native coronary artery without angina pectoris: Secondary | ICD-10-CM | POA: Diagnosis not present

## 2018-04-17 DIAGNOSIS — F410 Panic disorder [episodic paroxysmal anxiety] without agoraphobia: Secondary | ICD-10-CM | POA: Diagnosis not present

## 2018-04-17 DIAGNOSIS — F331 Major depressive disorder, recurrent, moderate: Secondary | ICD-10-CM | POA: Diagnosis not present

## 2018-04-17 DIAGNOSIS — F411 Generalized anxiety disorder: Secondary | ICD-10-CM | POA: Diagnosis not present

## 2018-04-20 DIAGNOSIS — E559 Vitamin D deficiency, unspecified: Secondary | ICD-10-CM | POA: Diagnosis not present

## 2018-04-20 DIAGNOSIS — F411 Generalized anxiety disorder: Secondary | ICD-10-CM | POA: Diagnosis not present

## 2018-04-20 DIAGNOSIS — F332 Major depressive disorder, recurrent severe without psychotic features: Secondary | ICD-10-CM | POA: Diagnosis not present

## 2018-04-20 DIAGNOSIS — F410 Panic disorder [episodic paroxysmal anxiety] without agoraphobia: Secondary | ICD-10-CM | POA: Diagnosis not present

## 2018-04-24 DIAGNOSIS — F332 Major depressive disorder, recurrent severe without psychotic features: Secondary | ICD-10-CM | POA: Diagnosis not present

## 2018-04-24 DIAGNOSIS — I10 Essential (primary) hypertension: Secondary | ICD-10-CM | POA: Diagnosis not present

## 2018-04-24 DIAGNOSIS — E785 Hyperlipidemia, unspecified: Secondary | ICD-10-CM | POA: Diagnosis not present

## 2018-04-24 DIAGNOSIS — F411 Generalized anxiety disorder: Secondary | ICD-10-CM | POA: Diagnosis not present

## 2018-04-26 DIAGNOSIS — F411 Generalized anxiety disorder: Secondary | ICD-10-CM | POA: Diagnosis not present

## 2018-04-26 DIAGNOSIS — E559 Vitamin D deficiency, unspecified: Secondary | ICD-10-CM | POA: Diagnosis not present

## 2018-04-26 DIAGNOSIS — K219 Gastro-esophageal reflux disease without esophagitis: Secondary | ICD-10-CM | POA: Diagnosis not present

## 2018-04-26 DIAGNOSIS — F332 Major depressive disorder, recurrent severe without psychotic features: Secondary | ICD-10-CM | POA: Diagnosis not present

## 2018-05-01 DIAGNOSIS — E6609 Other obesity due to excess calories: Secondary | ICD-10-CM | POA: Insufficient documentation

## 2018-05-02 DIAGNOSIS — E538 Deficiency of other specified B group vitamins: Secondary | ICD-10-CM | POA: Diagnosis not present

## 2018-05-02 DIAGNOSIS — F332 Major depressive disorder, recurrent severe without psychotic features: Secondary | ICD-10-CM | POA: Diagnosis not present

## 2018-05-02 DIAGNOSIS — E559 Vitamin D deficiency, unspecified: Secondary | ICD-10-CM | POA: Diagnosis not present

## 2018-05-02 DIAGNOSIS — E785 Hyperlipidemia, unspecified: Secondary | ICD-10-CM | POA: Diagnosis not present

## 2018-05-03 DIAGNOSIS — E785 Hyperlipidemia, unspecified: Secondary | ICD-10-CM | POA: Diagnosis not present

## 2018-05-03 DIAGNOSIS — F411 Generalized anxiety disorder: Secondary | ICD-10-CM | POA: Diagnosis not present

## 2018-05-03 DIAGNOSIS — F332 Major depressive disorder, recurrent severe without psychotic features: Secondary | ICD-10-CM | POA: Diagnosis not present

## 2018-05-03 DIAGNOSIS — E538 Deficiency of other specified B group vitamins: Secondary | ICD-10-CM | POA: Diagnosis not present

## 2018-05-04 DIAGNOSIS — E785 Hyperlipidemia, unspecified: Secondary | ICD-10-CM | POA: Diagnosis not present

## 2018-05-04 DIAGNOSIS — F411 Generalized anxiety disorder: Secondary | ICD-10-CM | POA: Diagnosis not present

## 2018-05-04 DIAGNOSIS — E538 Deficiency of other specified B group vitamins: Secondary | ICD-10-CM | POA: Diagnosis not present

## 2018-05-04 DIAGNOSIS — F332 Major depressive disorder, recurrent severe without psychotic features: Secondary | ICD-10-CM | POA: Diagnosis not present

## 2018-05-05 DIAGNOSIS — E785 Hyperlipidemia, unspecified: Secondary | ICD-10-CM | POA: Diagnosis not present

## 2018-05-05 DIAGNOSIS — E538 Deficiency of other specified B group vitamins: Secondary | ICD-10-CM | POA: Diagnosis not present

## 2018-05-05 DIAGNOSIS — F411 Generalized anxiety disorder: Secondary | ICD-10-CM | POA: Diagnosis not present

## 2018-05-05 DIAGNOSIS — F332 Major depressive disorder, recurrent severe without psychotic features: Secondary | ICD-10-CM | POA: Diagnosis not present

## 2018-05-07 DIAGNOSIS — E785 Hyperlipidemia, unspecified: Secondary | ICD-10-CM | POA: Diagnosis not present

## 2018-05-07 DIAGNOSIS — E538 Deficiency of other specified B group vitamins: Secondary | ICD-10-CM | POA: Diagnosis not present

## 2018-05-07 DIAGNOSIS — F411 Generalized anxiety disorder: Secondary | ICD-10-CM | POA: Diagnosis not present

## 2018-05-07 DIAGNOSIS — F332 Major depressive disorder, recurrent severe without psychotic features: Secondary | ICD-10-CM | POA: Diagnosis not present

## 2018-05-08 DIAGNOSIS — F411 Generalized anxiety disorder: Secondary | ICD-10-CM | POA: Diagnosis not present

## 2018-05-08 DIAGNOSIS — F332 Major depressive disorder, recurrent severe without psychotic features: Secondary | ICD-10-CM | POA: Diagnosis not present

## 2018-05-08 DIAGNOSIS — F410 Panic disorder [episodic paroxysmal anxiety] without agoraphobia: Secondary | ICD-10-CM | POA: Diagnosis not present

## 2018-05-11 DIAGNOSIS — F411 Generalized anxiety disorder: Secondary | ICD-10-CM | POA: Diagnosis not present

## 2018-05-12 DIAGNOSIS — F411 Generalized anxiety disorder: Secondary | ICD-10-CM | POA: Diagnosis not present

## 2018-05-15 DIAGNOSIS — F411 Generalized anxiety disorder: Secondary | ICD-10-CM | POA: Diagnosis not present

## 2018-05-16 DIAGNOSIS — F411 Generalized anxiety disorder: Secondary | ICD-10-CM | POA: Diagnosis not present

## 2018-05-17 DIAGNOSIS — F411 Generalized anxiety disorder: Secondary | ICD-10-CM | POA: Diagnosis not present

## 2018-05-18 DIAGNOSIS — F331 Major depressive disorder, recurrent, moderate: Secondary | ICD-10-CM | POA: Diagnosis not present

## 2018-05-19 DIAGNOSIS — F411 Generalized anxiety disorder: Secondary | ICD-10-CM | POA: Diagnosis not present

## 2018-05-19 DIAGNOSIS — F330 Major depressive disorder, recurrent, mild: Secondary | ICD-10-CM | POA: Diagnosis not present

## 2018-05-24 DIAGNOSIS — N430 Encysted hydrocele: Secondary | ICD-10-CM | POA: Diagnosis not present

## 2018-05-24 DIAGNOSIS — R1030 Lower abdominal pain, unspecified: Secondary | ICD-10-CM | POA: Diagnosis not present

## 2018-05-27 HISTORY — PX: CORONARY ANGIOPLASTY WITH STENT PLACEMENT: SHX49

## 2018-05-29 DIAGNOSIS — F331 Major depressive disorder, recurrent, moderate: Secondary | ICD-10-CM | POA: Diagnosis not present

## 2018-05-30 DIAGNOSIS — F331 Major depressive disorder, recurrent, moderate: Secondary | ICD-10-CM | POA: Diagnosis not present

## 2018-06-01 DIAGNOSIS — F331 Major depressive disorder, recurrent, moderate: Secondary | ICD-10-CM | POA: Diagnosis not present

## 2018-06-02 DIAGNOSIS — F410 Panic disorder [episodic paroxysmal anxiety] without agoraphobia: Secondary | ICD-10-CM | POA: Diagnosis not present

## 2018-06-02 DIAGNOSIS — E538 Deficiency of other specified B group vitamins: Secondary | ICD-10-CM | POA: Diagnosis not present

## 2018-06-02 DIAGNOSIS — R413 Other amnesia: Secondary | ICD-10-CM | POA: Insufficient documentation

## 2018-06-02 DIAGNOSIS — F411 Generalized anxiety disorder: Secondary | ICD-10-CM | POA: Diagnosis not present

## 2018-06-16 DIAGNOSIS — F410 Panic disorder [episodic paroxysmal anxiety] without agoraphobia: Secondary | ICD-10-CM | POA: Diagnosis not present

## 2018-06-16 DIAGNOSIS — F411 Generalized anxiety disorder: Secondary | ICD-10-CM | POA: Diagnosis not present

## 2018-06-16 DIAGNOSIS — F3341 Major depressive disorder, recurrent, in partial remission: Secondary | ICD-10-CM | POA: Diagnosis not present

## 2018-07-13 DIAGNOSIS — F411 Generalized anxiety disorder: Secondary | ICD-10-CM | POA: Diagnosis not present

## 2018-07-13 DIAGNOSIS — Z955 Presence of coronary angioplasty implant and graft: Secondary | ICD-10-CM | POA: Diagnosis not present

## 2018-07-13 DIAGNOSIS — I200 Unstable angina: Secondary | ICD-10-CM | POA: Diagnosis not present

## 2018-07-13 DIAGNOSIS — Z79899 Other long term (current) drug therapy: Secondary | ICD-10-CM | POA: Diagnosis not present

## 2018-07-13 DIAGNOSIS — I25110 Atherosclerotic heart disease of native coronary artery with unstable angina pectoris: Secondary | ICD-10-CM | POA: Diagnosis not present

## 2018-07-13 DIAGNOSIS — E785 Hyperlipidemia, unspecified: Secondary | ICD-10-CM | POA: Diagnosis not present

## 2018-07-13 DIAGNOSIS — F3341 Major depressive disorder, recurrent, in partial remission: Secondary | ICD-10-CM | POA: Diagnosis not present

## 2018-07-13 DIAGNOSIS — K219 Gastro-esophageal reflux disease without esophagitis: Secondary | ICD-10-CM | POA: Diagnosis not present

## 2018-07-13 DIAGNOSIS — R072 Precordial pain: Secondary | ICD-10-CM | POA: Diagnosis not present

## 2018-07-13 DIAGNOSIS — R079 Chest pain, unspecified: Secondary | ICD-10-CM | POA: Diagnosis not present

## 2018-07-13 DIAGNOSIS — I2584 Coronary atherosclerosis due to calcified coronary lesion: Secondary | ICD-10-CM | POA: Diagnosis not present

## 2018-07-13 DIAGNOSIS — F410 Panic disorder [episodic paroxysmal anxiety] without agoraphobia: Secondary | ICD-10-CM | POA: Diagnosis not present

## 2018-07-13 DIAGNOSIS — I2510 Atherosclerotic heart disease of native coronary artery without angina pectoris: Secondary | ICD-10-CM | POA: Diagnosis not present

## 2018-07-13 DIAGNOSIS — Z885 Allergy status to narcotic agent status: Secondary | ICD-10-CM | POA: Diagnosis not present

## 2018-07-13 DIAGNOSIS — I10 Essential (primary) hypertension: Secondary | ICD-10-CM | POA: Diagnosis not present

## 2018-07-13 DIAGNOSIS — Z7982 Long term (current) use of aspirin: Secondary | ICD-10-CM | POA: Diagnosis not present

## 2018-07-14 DIAGNOSIS — I371 Nonrheumatic pulmonary valve insufficiency: Secondary | ICD-10-CM | POA: Diagnosis not present

## 2018-07-16 MED ORDER — DIPHENHYDRAMINE HCL 25 MG PO CAPS
25.00 | ORAL_CAPSULE | ORAL | Status: DC
Start: ? — End: 2018-07-16

## 2018-07-16 MED ORDER — NITROGLYCERIN 0.4 MG SL SUBL
0.40 | SUBLINGUAL_TABLET | SUBLINGUAL | Status: DC
Start: ? — End: 2018-07-16

## 2018-07-16 MED ORDER — SODIUM CHLORIDE 0.9 % IV SOLN
100.00 | INTRAVENOUS | Status: DC
Start: ? — End: 2018-07-16

## 2018-07-16 MED ORDER — VITAMIN D3 25 MCG (1000 UNIT) PO TABS
2000.00 | ORAL_TABLET | ORAL | Status: DC
Start: 2018-07-15 — End: 2018-07-16

## 2018-07-16 MED ORDER — ALPRAZOLAM 0.25 MG PO TABS
0.25 | ORAL_TABLET | ORAL | Status: DC
Start: ? — End: 2018-07-16

## 2018-07-16 MED ORDER — METOPROLOL TARTRATE 25 MG PO TABS
25.00 | ORAL_TABLET | ORAL | Status: DC
Start: 2018-07-14 — End: 2018-07-16

## 2018-07-16 MED ORDER — ROSUVASTATIN CALCIUM 40 MG PO TABS
40.00 | ORAL_TABLET | ORAL | Status: DC
Start: 2018-07-14 — End: 2018-07-16

## 2018-07-16 MED ORDER — GENERIC EXTERNAL MEDICATION
Status: DC
Start: ? — End: 2018-07-16

## 2018-07-16 MED ORDER — ALUM & MAG HYDROXIDE-SIMETH 200-200-20 MG/5ML PO SUSP
30.00 | ORAL | Status: DC
Start: ? — End: 2018-07-16

## 2018-07-16 MED ORDER — TICAGRELOR 90 MG PO TABS
90.00 | ORAL_TABLET | ORAL | Status: DC
Start: 2018-07-14 — End: 2018-07-16

## 2018-07-16 MED ORDER — LORAZEPAM 0.5 MG PO TABS
.50 | ORAL_TABLET | ORAL | Status: DC
Start: ? — End: 2018-07-16

## 2018-07-16 MED ORDER — MORPHINE SULFATE (PF) 4 MG/ML IV SOLN
2.00 | INTRAVENOUS | Status: DC
Start: ? — End: 2018-07-16

## 2018-07-16 MED ORDER — SENNA-DOCUSATE SODIUM 8.6-50 MG PO TABS
1.00 | ORAL_TABLET | ORAL | Status: DC
Start: ? — End: 2018-07-16

## 2018-07-16 MED ORDER — ZOLPIDEM TARTRATE 5 MG PO TABS
5.00 | ORAL_TABLET | ORAL | Status: DC
Start: ? — End: 2018-07-16

## 2018-07-16 MED ORDER — HEPARIN SODIUM (PORCINE) 5000 UNIT/ML IJ SOLN
5000.00 | INTRAMUSCULAR | Status: DC
Start: 2018-07-14 — End: 2018-07-16

## 2018-07-16 MED ORDER — SODIUM CHLORIDE 0.9 % IV SOLN
10.00 | INTRAVENOUS | Status: DC
Start: ? — End: 2018-07-16

## 2018-07-16 MED ORDER — ASPIRIN 81 MG PO CHEW
81.00 | CHEWABLE_TABLET | ORAL | Status: DC
Start: 2018-07-15 — End: 2018-07-16

## 2018-07-16 MED ORDER — MIRTAZAPINE 15 MG PO TABS
30.00 | ORAL_TABLET | ORAL | Status: DC
Start: 2018-07-14 — End: 2018-07-16

## 2018-07-16 MED ORDER — MAGNESIUM HYDROXIDE 400 MG/5ML PO SUSP
30.00 | ORAL | Status: DC
Start: ? — End: 2018-07-16

## 2018-07-16 MED ORDER — METOCLOPRAMIDE HCL 5 MG/ML IJ SOLN
10.00 | INTRAMUSCULAR | Status: DC
Start: ? — End: 2018-07-16

## 2018-07-16 MED ORDER — QUETIAPINE FUMARATE 200 MG PO TABS
200.00 | ORAL_TABLET | ORAL | Status: DC
Start: 2018-07-14 — End: 2018-07-16

## 2018-07-16 MED ORDER — VITAMIN B-12 1000 MCG PO TABS
1000.00 | ORAL_TABLET | ORAL | Status: DC
Start: 2018-07-15 — End: 2018-07-16

## 2018-07-16 MED ORDER — GABAPENTIN 300 MG PO CAPS
300.00 | ORAL_CAPSULE | ORAL | Status: DC
Start: 2018-07-14 — End: 2018-07-16

## 2018-07-18 DIAGNOSIS — F411 Generalized anxiety disorder: Secondary | ICD-10-CM | POA: Diagnosis not present

## 2018-07-18 DIAGNOSIS — F410 Panic disorder [episodic paroxysmal anxiety] without agoraphobia: Secondary | ICD-10-CM | POA: Diagnosis not present

## 2018-07-18 DIAGNOSIS — F330 Major depressive disorder, recurrent, mild: Secondary | ICD-10-CM | POA: Diagnosis not present

## 2018-08-09 DIAGNOSIS — R0602 Shortness of breath: Secondary | ICD-10-CM | POA: Diagnosis not present

## 2018-08-09 DIAGNOSIS — I2510 Atherosclerotic heart disease of native coronary artery without angina pectoris: Secondary | ICD-10-CM | POA: Diagnosis not present

## 2018-08-09 DIAGNOSIS — Z955 Presence of coronary angioplasty implant and graft: Secondary | ICD-10-CM | POA: Diagnosis not present

## 2018-08-15 DIAGNOSIS — F411 Generalized anxiety disorder: Secondary | ICD-10-CM | POA: Diagnosis not present

## 2018-08-15 DIAGNOSIS — F330 Major depressive disorder, recurrent, mild: Secondary | ICD-10-CM | POA: Diagnosis not present

## 2018-08-15 DIAGNOSIS — F410 Panic disorder [episodic paroxysmal anxiety] without agoraphobia: Secondary | ICD-10-CM | POA: Diagnosis not present

## 2018-08-16 DIAGNOSIS — F410 Panic disorder [episodic paroxysmal anxiety] without agoraphobia: Secondary | ICD-10-CM | POA: Diagnosis not present

## 2018-08-16 DIAGNOSIS — F411 Generalized anxiety disorder: Secondary | ICD-10-CM | POA: Diagnosis not present

## 2018-08-16 DIAGNOSIS — F330 Major depressive disorder, recurrent, mild: Secondary | ICD-10-CM | POA: Diagnosis not present

## 2018-09-07 DIAGNOSIS — I2510 Atherosclerotic heart disease of native coronary artery without angina pectoris: Secondary | ICD-10-CM | POA: Diagnosis not present

## 2018-09-07 DIAGNOSIS — Z955 Presence of coronary angioplasty implant and graft: Secondary | ICD-10-CM | POA: Diagnosis not present

## 2018-09-07 DIAGNOSIS — E782 Mixed hyperlipidemia: Secondary | ICD-10-CM | POA: Diagnosis not present

## 2018-09-29 DIAGNOSIS — R1030 Lower abdominal pain, unspecified: Secondary | ICD-10-CM | POA: Diagnosis not present

## 2018-09-29 DIAGNOSIS — N430 Encysted hydrocele: Secondary | ICD-10-CM | POA: Diagnosis not present

## 2018-09-29 DIAGNOSIS — Z125 Encounter for screening for malignant neoplasm of prostate: Secondary | ICD-10-CM | POA: Diagnosis not present

## 2018-10-02 DIAGNOSIS — F411 Generalized anxiety disorder: Secondary | ICD-10-CM | POA: Diagnosis not present

## 2018-10-02 DIAGNOSIS — F410 Panic disorder [episodic paroxysmal anxiety] without agoraphobia: Secondary | ICD-10-CM | POA: Diagnosis not present

## 2018-10-02 DIAGNOSIS — F330 Major depressive disorder, recurrent, mild: Secondary | ICD-10-CM | POA: Diagnosis not present

## 2018-11-01 ENCOUNTER — Telehealth: Payer: Self-pay | Admitting: Family Medicine

## 2018-11-01 NOTE — Telephone Encounter (Signed)
Left message to return call. Not sure what message was left yesterday.

## 2018-11-01 NOTE — Telephone Encounter (Signed)
Feel free to call back tomorrowLip liv m7 psa  In mid decem\ber   appt mid  Jan  for wellness and chronic

## 2018-11-01 NOTE — Telephone Encounter (Signed)
Pt wife Darl Pikes called back. Pt wife states that [place] received a message and she thought it was pertaining to patient needing appointment. Pt wife states that back in July he had 2 stints placed due to [facility] finding 2 blockages. The doctor at [facility] kept patient on Crestor 40 mg and Metoprolol 25 mg. Pt wife states he has plenty of refills and that is why he has not been to see PCP. Pt wife wanting to know if patient needs appointment or lab work. Please advise.

## 2018-11-01 NOTE — Telephone Encounter (Signed)
Pt's wife Darl Pikes calling in for pt regarding the message that was left yesterday about his lab work and medication. He was at [facility] the end of July and they found two blockages in his heart and had to put in two stents. The doctor there refilled his Crestor. So that is why he hasn't went and had his lab work done for our office and made an appt with Dr. Brett Canales for refills. She said once he finishes the refills he has he will get his lab work done and follow up with Dr. Brett Canales.

## 2018-11-02 ENCOUNTER — Other Ambulatory Visit: Payer: Self-pay | Admitting: Family Medicine

## 2018-11-02 ENCOUNTER — Other Ambulatory Visit: Payer: Self-pay

## 2018-11-02 DIAGNOSIS — E785 Hyperlipidemia, unspecified: Secondary | ICD-10-CM

## 2018-11-02 DIAGNOSIS — Z125 Encounter for screening for malignant neoplasm of prostate: Secondary | ICD-10-CM

## 2018-11-02 DIAGNOSIS — I10 Essential (primary) hypertension: Secondary | ICD-10-CM

## 2018-11-02 DIAGNOSIS — Z79899 Other long term (current) drug therapy: Secondary | ICD-10-CM

## 2018-11-02 NOTE — Telephone Encounter (Signed)
Labs placed and patient notified. Pt was transferred up front to set up appointment for wellness/chronic

## 2018-11-06 DIAGNOSIS — Z79899 Other long term (current) drug therapy: Secondary | ICD-10-CM | POA: Diagnosis not present

## 2018-11-06 DIAGNOSIS — F411 Generalized anxiety disorder: Secondary | ICD-10-CM | POA: Diagnosis not present

## 2018-11-06 DIAGNOSIS — F410 Panic disorder [episodic paroxysmal anxiety] without agoraphobia: Secondary | ICD-10-CM | POA: Diagnosis not present

## 2018-11-06 DIAGNOSIS — F331 Major depressive disorder, recurrent, moderate: Secondary | ICD-10-CM | POA: Diagnosis not present

## 2018-12-07 DIAGNOSIS — F410 Panic disorder [episodic paroxysmal anxiety] without agoraphobia: Secondary | ICD-10-CM | POA: Diagnosis not present

## 2018-12-07 DIAGNOSIS — F331 Major depressive disorder, recurrent, moderate: Secondary | ICD-10-CM | POA: Diagnosis not present

## 2018-12-07 DIAGNOSIS — F411 Generalized anxiety disorder: Secondary | ICD-10-CM | POA: Diagnosis not present

## 2018-12-15 DIAGNOSIS — I10 Essential (primary) hypertension: Secondary | ICD-10-CM | POA: Diagnosis not present

## 2018-12-15 DIAGNOSIS — E785 Hyperlipidemia, unspecified: Secondary | ICD-10-CM | POA: Diagnosis not present

## 2018-12-15 DIAGNOSIS — Z125 Encounter for screening for malignant neoplasm of prostate: Secondary | ICD-10-CM | POA: Diagnosis not present

## 2018-12-15 DIAGNOSIS — Z79899 Other long term (current) drug therapy: Secondary | ICD-10-CM | POA: Diagnosis not present

## 2018-12-16 LAB — BASIC METABOLIC PANEL
BUN/Creatinine Ratio: 9 — ABNORMAL LOW (ref 10–24)
BUN: 11 mg/dL (ref 8–27)
CO2: 21 mmol/L (ref 20–29)
Calcium: 9.9 mg/dL (ref 8.6–10.2)
Chloride: 105 mmol/L (ref 96–106)
Creatinine, Ser: 1.21 mg/dL (ref 0.76–1.27)
GFR, EST AFRICAN AMERICAN: 70 mL/min/{1.73_m2} (ref >59)
GFR, EST NON AFRICAN AMERICAN: 61 mL/min/{1.73_m2} (ref >59)
Glucose: 105 mg/dL — ABNORMAL HIGH (ref 65–99)
POTASSIUM: 4.4 mmol/L (ref 3.5–5.2)
SODIUM: 142 mmol/L (ref 134–144)

## 2018-12-16 LAB — HEPATIC FUNCTION PANEL
ALBUMIN: 4.4 g/dL (ref 3.6–4.8)
ALK PHOS: 77 IU/L (ref 39–117)
ALT: 26 IU/L (ref 0–44)
AST: 28 IU/L (ref 0–40)
BILIRUBIN, DIRECT: 0.07 mg/dL (ref 0.00–0.40)
Bilirubin Total: <0.2 mg/dL (ref 0.0–1.2)
Total Protein: 7.3 g/dL (ref 6.0–8.5)

## 2018-12-16 LAB — PSA: PROSTATE SPECIFIC AG, SERUM: 0.8 ng/mL (ref 0.0–4.0)

## 2018-12-16 LAB — LIPID PANEL
CHOL/HDL RATIO: 2.7 ratio (ref 0.0–5.0)
Cholesterol, Total: 130 mg/dL (ref 100–199)
HDL: 49 mg/dL (ref >39)
LDL CALC: 57 mg/dL (ref 0–99)
Triglycerides: 121 mg/dL (ref 0–149)
VLDL Cholesterol Cal: 24 mg/dL (ref 5–40)

## 2019-01-09 ENCOUNTER — Encounter: Payer: Self-pay | Admitting: Family Medicine

## 2019-01-09 ENCOUNTER — Ambulatory Visit: Payer: Medicare Other | Admitting: Family Medicine

## 2019-01-09 VITALS — BP 136/88 | Ht 66.0 in | Wt 199.6 lb

## 2019-01-09 DIAGNOSIS — Z0000 Encounter for general adult medical examination without abnormal findings: Secondary | ICD-10-CM

## 2019-01-09 DIAGNOSIS — E7800 Pure hypercholesterolemia, unspecified: Secondary | ICD-10-CM

## 2019-01-09 MED ORDER — ROSUVASTATIN CALCIUM 40 MG PO TABS: 40.0000 mg | ORAL_TABLET | Freq: Every day | ORAL | 1 refills | Status: DC

## 2019-01-09 NOTE — Progress Notes (Signed)
Subjective:    Patient ID: Devin Daugherty, male    DOB: [birth year], 68 y.o.   MRN: [id number]  HPI AWV- Annual Wellness Visit  The patient was seen for their annual wellness visit. The patient's past medical history, surgical history, and family history were reviewed. Pertinent vaccines were reviewed ( tetanus, pneumonia, shingles, flu) The patient's medication list was reviewed and updated.  The height and weight were entered.  BMI recorded in electronic record elsewhere  Cognitive screening was completed. Outcome of Mini - Cog: pass    Falls /depression screening electronically recorded within record elsewhere  Current tobacco usage: none (All patients who use tobacco were given  written and verbal information on quitting)  Recent listing of emergency department/hospitalizations over the past year were reviewed.  current specialist the patient sees on a regular basis: none   Medicare annual wellness visit patient questionnaire was reviewed.  A written screening schedule for the patient for the next 5-10 years was given. Appropriate discussion of followup regarding next visit was discussed.  Pt declines flu vaccine.    Results for orders placed or performed in visit on 11/02/18  PSA  Result Value Ref Range   Prostate Specific Ag, Serum 0.8 0.0 - 4.0 ng/mL  Basic Metabolic Panel (BMET)  Result Value Ref Range   Glucose 105 (H) 65 - 99 mg/dL   BUN 11 8 - 27 mg/dL   Creatinine, Ser 7.821.21 0.76 - 1.27 mg/dL   GFR calc non Af Amer 61 >59 mL/min/1.73   GFR calc Af Amer 70 >59 mL/min/1.73   BUN/Creatinine Ratio 9 (L) 10 - 24   Sodium 142 134 - 144 mmol/L   Potassium 4.4 3.5 - 5.2 mmol/L   Chloride 105 96 - 106 mmol/L   CO2 21 20 - 29 mmol/L   Calcium 9.9 8.6 - 10.2 mg/dL  Hepatic function panel  Result Value Ref Range   Total Protein 7.3 6.0 - 8.5 g/dL   Albumin 4.4 3.6 - 4.8 g/dL   Bilirubin Total <9.5<0.2 0.0 - 1.2 mg/dL   Bilirubin, Direct 6.210.07 0.00 - 0.40 mg/dL   Alkaline Phosphatase 77 39 - 117 IU/L   AST 28 0 - 40 IU/L   ALT 26 0 - 44 IU/L  Lipid Profile  Result Value Ref Range   Cholesterol, Total 130 100 - 199 mg/dL   Triglycerides 308121 0 - 149 mg/dL   HDL 49 >65>39 mg/dL   VLDL Cholesterol Cal 24 5 - 40 mg/dL   LDL Calculated 57 0 - 99 mg/dL   Chol/HDL Ratio 2.7 0.0 - 5.0 ratio    prdiabetes discussed  Patient continues to take lipid medication regularly. No obvious side effects from it. Generally does not miss a dose. Prior blood work results are reviewed with patient. Patient continues to work on fat intake in diet    Review of Systems  Constitutional: Negative for activity change, appetite change and fever.  HENT: Negative for congestion and rhinorrhea.   Eyes: Negative for discharge.  Respiratory: Negative for cough and wheezing.   Cardiovascular: Negative for chest pain.  Gastrointestinal: Negative for abdominal pain, blood in stool and vomiting.  Genitourinary: Negative for difficulty urinating and frequency.  Musculoskeletal: Negative for neck pain.  Skin: Negative for rash.  Allergic/Immunologic: Negative for environmental allergies and food allergies.  Neurological: Negative for weakness and headaches.  Psychiatric/Behavioral: Negative for agitation.  All other systems reviewed and are negative.      Objective:   Physical  Exam Vitals signs reviewed.  Constitutional:      Appearance: He is well-developed.  HENT:     Head: Normocephalic and atraumatic.     Right Ear: External ear normal.     Left Ear: External ear normal.     Nose: Nose normal.  Eyes:     Pupils: Pupils are equal, round, and reactive to light.  Neck:     Musculoskeletal: Normal range of motion and neck supple.     Thyroid: No thyromegaly.  Cardiovascular:     Rate and Rhythm: Normal rate and regular rhythm.     Heart sounds: Normal heart sounds. No murmur.  Pulmonary:     Effort: Pulmonary effort is normal. No respiratory distress.     Breath  sounds: Normal breath sounds. No wheezing.  Abdominal:     General: Bowel sounds are normal. There is no distension.     Palpations: Abdomen is soft. There is no mass.     Tenderness: There is no abdominal tenderness.  Genitourinary:    Penis: Normal.      Prostate: Normal.  Musculoskeletal: Normal range of motion.  Lymphadenopathy:     Cervical: No cervical adenopathy.  Skin:    General: Skin is warm and dry.     Findings: No erythema.  Neurological:     Mental Status: He is alert.     Motor: No abnormal muscle tone.  Psychiatric:        Behavior: Behavior normal.        Judgment: Judgment normal.           Assessment & Plan:  Impression wellness exam.  Diet discussed.  Exercise discussed.  Vaccines discussed.  2.  Coronary artery disease.  Patient followed by specialist in [place].  Status post 2 angioplasties earlier this year.  3.  Hyperlipidemia.  Blood work discussed.  LDL at goal.  Will maintain same dose meds  4.  Chronic anxiety.  Fairly profound.  On multiple meds under close care of psychiatrist.  Unfortunately ongoing substantial challenges discussed today with patient.

## 2019-01-09 NOTE — Patient Instructions (Signed)
Results for orders placed or performed in visit on 11/02/18  PSA  Result Value Ref Range   Prostate Specific Ag, Serum 0.8 0.0 - 4.0 ng/mL  Basic Metabolic Panel (BMET)  Result Value Ref Range   Glucose 105 (H) 65 - 99 mg/dL   BUN 11 8 - 27 mg/dL   Creatinine, Ser 0.56 0.76 - 1.27 mg/dL   GFR calc non Af Amer 61 >59 mL/min/1.73   GFR calc Af Amer 70 >59 mL/min/1.73   BUN/Creatinine Ratio 9 (L) 10 - 24   Sodium 142 134 - 144 mmol/L   Potassium 4.4 3.5 - 5.2 mmol/L   Chloride 105 96 - 106 mmol/L   CO2 21 20 - 29 mmol/L   Calcium 9.9 8.6 - 10.2 mg/dL  Hepatic function panel  Result Value Ref Range   Total Protein 7.3 6.0 - 8.5 g/dL   Albumin 4.4 3.6 - 4.8 g/dL   Bilirubin Total <9.7 0.0 - 1.2 mg/dL   Bilirubin, Direct 9.48 0.00 - 0.40 mg/dL   Alkaline Phosphatase 77 39 - 117 IU/L   AST 28 0 - 40 IU/L   ALT 26 0 - 44 IU/L  Lipid Profile  Result Value Ref Range   Cholesterol, Total 130 100 - 199 mg/dL   Triglycerides 016 0 - 149 mg/dL   HDL 49 >55 mg/dL   VLDL Cholesterol Cal 24 5 - 40 mg/dL   LDL Calculated 57 0 - 99 mg/dL   Chol/HDL Ratio 2.7 0.0 - 5.0 ratio

## 2019-01-10 DIAGNOSIS — F410 Panic disorder [episodic paroxysmal anxiety] without agoraphobia: Secondary | ICD-10-CM | POA: Diagnosis not present

## 2019-01-10 DIAGNOSIS — F411 Generalized anxiety disorder: Secondary | ICD-10-CM | POA: Diagnosis not present

## 2019-01-16 DIAGNOSIS — F331 Major depressive disorder, recurrent, moderate: Secondary | ICD-10-CM | POA: Diagnosis not present

## 2019-01-16 DIAGNOSIS — F410 Panic disorder [episodic paroxysmal anxiety] without agoraphobia: Secondary | ICD-10-CM | POA: Diagnosis not present

## 2019-01-16 DIAGNOSIS — F411 Generalized anxiety disorder: Secondary | ICD-10-CM | POA: Diagnosis not present

## 2019-02-06 DIAGNOSIS — F331 Major depressive disorder, recurrent, moderate: Secondary | ICD-10-CM | POA: Diagnosis not present

## 2019-02-06 DIAGNOSIS — F410 Panic disorder [episodic paroxysmal anxiety] without agoraphobia: Secondary | ICD-10-CM | POA: Diagnosis not present

## 2019-02-06 DIAGNOSIS — F411 Generalized anxiety disorder: Secondary | ICD-10-CM | POA: Diagnosis not present

## 2019-02-07 DIAGNOSIS — F331 Major depressive disorder, recurrent, moderate: Secondary | ICD-10-CM | POA: Diagnosis not present

## 2019-02-07 DIAGNOSIS — F411 Generalized anxiety disorder: Secondary | ICD-10-CM | POA: Diagnosis not present

## 2019-02-07 DIAGNOSIS — F410 Panic disorder [episodic paroxysmal anxiety] without agoraphobia: Secondary | ICD-10-CM | POA: Diagnosis not present

## 2019-02-07 DIAGNOSIS — G3184 Mild cognitive impairment, so stated: Secondary | ICD-10-CM | POA: Diagnosis not present

## 2019-02-16 DIAGNOSIS — F410 Panic disorder [episodic paroxysmal anxiety] without agoraphobia: Secondary | ICD-10-CM | POA: Diagnosis not present

## 2019-02-16 DIAGNOSIS — F411 Generalized anxiety disorder: Secondary | ICD-10-CM | POA: Diagnosis not present

## 2019-02-16 DIAGNOSIS — F1320 Sedative, hypnotic or anxiolytic dependence, uncomplicated: Secondary | ICD-10-CM | POA: Diagnosis not present

## 2019-02-16 DIAGNOSIS — F331 Major depressive disorder, recurrent, moderate: Secondary | ICD-10-CM | POA: Diagnosis not present

## 2019-03-16 DIAGNOSIS — F411 Generalized anxiety disorder: Secondary | ICD-10-CM | POA: Diagnosis not present

## 2019-03-16 DIAGNOSIS — F410 Panic disorder [episodic paroxysmal anxiety] without agoraphobia: Secondary | ICD-10-CM | POA: Diagnosis not present

## 2019-03-16 DIAGNOSIS — F330 Major depressive disorder, recurrent, mild: Secondary | ICD-10-CM | POA: Diagnosis not present

## 2019-03-16 DIAGNOSIS — G3184 Mild cognitive impairment, so stated: Secondary | ICD-10-CM | POA: Diagnosis not present

## 2019-04-05 DIAGNOSIS — I2510 Atherosclerotic heart disease of native coronary artery without angina pectoris: Secondary | ICD-10-CM | POA: Diagnosis not present

## 2019-04-05 DIAGNOSIS — Z955 Presence of coronary angioplasty implant and graft: Secondary | ICD-10-CM | POA: Diagnosis not present

## 2019-04-05 DIAGNOSIS — I10 Essential (primary) hypertension: Secondary | ICD-10-CM | POA: Diagnosis not present

## 2019-05-17 DIAGNOSIS — F3341 Major depressive disorder, recurrent, in partial remission: Secondary | ICD-10-CM | POA: Diagnosis not present

## 2019-05-17 DIAGNOSIS — G2119 Other drug induced secondary parkinsonism: Secondary | ICD-10-CM | POA: Diagnosis not present

## 2019-05-17 DIAGNOSIS — F411 Generalized anxiety disorder: Secondary | ICD-10-CM | POA: Diagnosis not present

## 2019-05-17 DIAGNOSIS — F410 Panic disorder [episodic paroxysmal anxiety] without agoraphobia: Secondary | ICD-10-CM | POA: Diagnosis not present

## 2019-06-01 ENCOUNTER — Telehealth: Payer: Self-pay | Admitting: Family Medicine

## 2019-06-01 DIAGNOSIS — G250 Essential tremor: Secondary | ICD-10-CM

## 2019-06-01 NOTE — Telephone Encounter (Signed)
Wife notified.

## 2019-06-01 NOTE — Telephone Encounter (Signed)
Patient went to see specialist in [place] for his anxiety and he wants him to see a neuorlogist due to his hand trimmers. He was told to get referral from primary care doctor for hand trimmers.They want to see one who handle hand trimmers if possible. Please Advise

## 2019-06-01 NOTE — Telephone Encounter (Signed)
Referral placed. Contacted patient. Pt wife picked up but the phone was messing up; call dropped. Will try again

## 2019-06-01 NOTE — Telephone Encounter (Signed)
[place] neurologist for hand tremors, the proper place to start is with a general neurologist

## 2019-06-01 NOTE — Telephone Encounter (Signed)
Please advise. Thank you

## 2019-06-04 NOTE — Progress Notes (Deleted)
Virtual Visit via Video Note The purpose of this virtual visit is to provide medical care while limiting exposure to the novel coronavirus.    Consent was obtained for video visit:  {yes no:314532} Answered questions that patient had about telehealth interaction:  {yes no:314532} I discussed the limitations, risks, security and privacy concerns of performing an evaluation and management service by telemedicine. I also discussed with the patient that there may be a patient responsible charge related to this service. The patient expressed understanding and agreed to proceed.  Pt location: Home Physician Location: office Name of referring provider:  Merlyn AlbertLuking, William S, MD I connected with Gar GibbonMichael L Vanroekel at patients initiation/request on 06/05/2019 at  1:30 PM EDT by video enabled telemedicine application and verified that I am speaking with the correct person using two identifiers. Pt MRN:  [id number] Pt DOB:  08/12/1951 Video Participants:  Gar GibbonMichael L Meiser;  ***  The consultation is for the evaluation of tremor.  The records that were made available to me were reviewed.  Only notes pertaining to tremor that I noted were from 2016 and it was felt tremor was related to anxiety.  He was recently seen by psychiatry for this.  He was started on Risperdal in November, and that was increased in December.  Specific Symptoms:  Tremor: {yes no:314532}   How long has it been going on? ***  At rest or with activation?  ***  When is it noted the most?  ***  Fam hx of tremor?  {yes [id number]}no:314532}  Located where?  ***  Affected by caffeine:  {yes no:314532}  Affected by alcohol:  {yes no:314532}  Affected by stress:  {yes no:314532}  Affected by fatigue:  {yes no:314532}  Spills soup if on spoon:  {yes no:314532}  Spills glass of liquid if full:  {yes no:314532}  Affects ADL's (tying shoes, brushing teeth, etc):  {yes no:314532}   Family hx of similar:  {yes no:314532} Voice: *** Sleep: ***  Vivid Dreams:  {yes no:314532}  Acting out dreams:  {yes no:314532} Wet Pillows: {yes no:314532} Postural symptoms:  {yes no:314532}  Falls?  {yes no:314532} Bradykinesia symptoms: {parkinson brady:18041} Loss of smell:  {yes no:314532} Loss of taste:  {yes no:314532} Urinary Incontinence:  {yes no:314532} Difficulty Swallowing:  {yes no:314532} Handwriting, micrographia: {yes no:314532} Trouble with ADL's:  {yes no:314532}  Trouble buttoning clothing: {yes no:314532} Depression:  {yes no:314532} Memory changes:  {yes no:314532} Hallucinations:  {yes no:314532}  visual distortions: {yes no:314532} N/V:  {yes no:314532} Lightheaded:  {yes no:314532}  Syncope: {yes no:314532} Diplopia:  {yes no:314532} Dyskinesia:  {yes no:314532} Prior exposure to reglan/antipsychotics: {yes no:314532}   Patient had an MRI of the brain last completed in 2004.  There was mild small vessel disease.  The actual images are not available  PREVIOUS MEDICATIONS FOR TREMOR: Lovenia ShuckXANAX, ATIVAN, KLONOPIN TREMOR PROVOKING MEDS: RISPERDONE  ALLERGIES:   Allergies  Allergen Reactions  . Codeine Itching    CURRENT MEDICATIONS:  Outpatient Encounter Medications as of 06/05/2019  Medication Sig  . clopidogrel (PLAVIX) 75 MG tablet Take by mouth.  . escitalopram (LEXAPRO) 10 MG tablet Take 1/2 tablet by mouth daily for 7-10 days, then increase to 1 full tablet  . gabapentin (NEURONTIN) 300 MG capsule Take by mouth.  Marland Kitchen. LORazepam (ATIVAN) 0.5 MG tablet One by mouth up to three times daily for anxiety.  . metoprolol succinate (TOPROL-XL) 25 MG 24 hr tablet Take by mouth.  . mirtazapine (REMERON) 30 MG tablet TAKE 1 TABLET  BY MOUTH AT BEDTIME  . risperiDONE (RISPERDAL) 1 MG tablet Take 1/2 tablet by mouth in the morning, 1/2 tablet in the afternoon and 1 tablet at bedtime  . rosuvastatin (CRESTOR) 40 MG tablet Take 1 tablet (40 mg total) by mouth daily.   No facility-administered encounter medications on file as of  06/05/2019.     PAST MEDICAL HISTORY:   Past Medical History:  Diagnosis Date  . Anxiety   . CAD (coronary artery disease)   . High cholesterol   . Hypertension     PAST SURGICAL HISTORY:   Past Surgical History:  Procedure Laterality Date  . COLONOSCOPY    . Heart  Stent  2009  . HERNIA REPAIR      SOCIAL HISTORY:   Social History   Socioeconomic History  . Marital status: Married    Spouse name: Not on file  . Number of children: 2  . Years of education: 57  . Highest education level: Not on file  Occupational History    Employer: [employer]  Social Needs  . Financial resource strain: Not on file  . Food insecurity:    Worry: Not on file    Inability: Not on file  . Transportation needs:    Medical: Not on file    Non-medical: Not on file  Tobacco Use  . Smoking status: Never Smoker  . Smokeless tobacco: Never Used  Substance and Sexual Activity  . Alcohol use: No  . Drug use: No  . Sexual activity: Not on file  Lifestyle  . Physical activity:    Days per week: Not on file    Minutes per session: Not on file  . Stress: Not on file  Relationships  . Social connections:    Talks on phone: Not on file    Gets together: Not on file    Attends religious service: Not on file    Active member of club or organization: Not on file    Attends meetings of clubs or organizations: Not on file    Relationship status: Not on file  . Intimate partner violence:    Fear of current or ex partner: Not on file    Emotionally abused: Not on file    Physically abused: Not on file    Forced sexual activity: Not on file  Other Topics Concern  . Not on file  Social History Narrative  . Not on file    FAMILY HISTORY:   Family Status  Relation Name Status  . Mother  Deceased  . Father  Deceased    ROS:  ROS  PHYSICAL EXAMINATION:    VITALS:  There were no vitals filed for this visit.  GEN:  The patient appears stated age and is in NAD.  Neurological  examination:  Orientation: The patient is alert and oriented x3. Fund of knowledge is appropriate.  Recent and remote memory are intact.  Attention and concentration are normal.    Able to name objects and repeat phrases. Cranial nerves: There is good facial symmetry. Extraocular muscles are intact. The visual fields are full to confrontational testing. The speech is fluent and clear. Soft palate rises symmetrically and there is no tongue deviation. Hearing is intact to conversational tone. Sensation: unable to test sensitive aspect via video Motor: Strength is at least antigravity x 4.   There is no pronator drift. Deep tendon reflexes: unable via video  Movement examination: Tone: unable Abnormal movements: *** Coordination:  There is *** decremation  with RAM's, *** Gait and Station: The patient has *** difficulty arising out of a deep-seated chair without the use of the hands. The patient's stride length is ***.    Lab Results  Component Value Date   TSH 1.184 12/06/2014     Chemistry      Component Value Date/Time   NA 142 12/15/2018 0912   K 4.4 12/15/2018 0912   CL 105 12/15/2018 0912   CO2 21 12/15/2018 0912   BUN 11 12/15/2018 0912   CREATININE 1.21 12/15/2018 0912   CREATININE 1.15 12/06/2014 0839      Component Value Date/Time   CALCIUM 9.9 12/15/2018 0912   ALKPHOS 77 12/15/2018 0912   AST 28 12/15/2018 0912   ALT 26 12/15/2018 0912   BILITOT <0.2 12/15/2018 0912       ASSESSMENT/PLAN:  ***1.  Tremor  -   Follow Up Instructions:    -I discussed the assessment and treatment plan with the patient. The patient was provided an opportunity to ask questions and all were answered. The patient agreed with the plan and demonstrated an understanding of the instructions.   The patient was advised to call back or seek an in-person evaluation if the symptoms worsen or if the condition fails to improve as anticipated.    Total Time spent in visit with the patient  was:  ***, of which more than 50% of the time was spent in counseling and/or coordinating care on ***.   Pt understands and agrees with the plan of care outlined.     Kerin Salenebecca Malania Gawthrop, DO

## 2019-06-05 ENCOUNTER — Telehealth: Payer: Self-pay | Admitting: Family Medicine

## 2019-06-05 ENCOUNTER — Other Ambulatory Visit: Payer: Self-pay

## 2019-06-05 ENCOUNTER — Telehealth: Payer: Self-pay

## 2019-06-05 ENCOUNTER — Telehealth: Payer: PRIVATE HEALTH INSURANCE | Admitting: Neurology

## 2019-06-05 NOTE — Telephone Encounter (Signed)
Called patient in regards to appt today  no answer unable to leave message on voice mail no voice mail  Also called home number listed no voice mail phone just rings.  Called several times no answer no return calls  Pt scheduled for new patient visit today Telemedicine visit

## 2019-06-05 NOTE — Telephone Encounter (Signed)
Pt is seeing Dr.Rebecca Tat [facility] in [place]); pt is currently getting ready for a virtual visit with neurologist. (Pt wife thought that pt needed an appt with provider to have a referral. )

## 2019-06-05 NOTE — Telephone Encounter (Signed)
Pt's wife called (left message on my voicemail) stated that pt's specialist (sorry, not sure exactly who) recommended pt see neurology  Please advise?  OV or virtual

## 2019-06-05 NOTE — Progress Notes (Deleted)
Virtual Visit via Video Note The purpose of this virtual visit is to provide medical care while limiting exposure to the novel coronavirus.    Consent was obtained for video visit:  {yes no:314532} Answered questions that patient had about telehealth interaction:  {yes no:314532} I discussed the limitations, risks, security and privacy concerns of performing an evaluation and management service by telemedicine. I also discussed with the patient that there may be a patient responsible charge related to this service. The patient expressed understanding and agreed to proceed.  Pt location: Home Physician Location: office Name of referring provider:  Merlyn AlbertLuking, William S, MD I connected with Gar GibbonMichael L Decoste at patients initiation/request on 06/13/2019 at 10:15 AM EDT by video enabled telemedicine application and verified that I am speaking with the correct person using two identifiers. Pt MRN:  [id number] Pt DOB:  [birth year] Video Participants:  Gar GibbonMichael L Hayse;  ***  The consultation is for the evaluation of tremor.  The records that were made available to me were reviewed.  Only notes pertaining to tremor that I noted were from 2016 and it was felt tremor was related to anxiety.  He was recently seen by psychiatry for this.  He was started on Risperdal in November, and that was increased in December.  Specific Symptoms:  Tremor: {yes no:314532}   How long has it been going on? ***  At rest or with activation?  ***  When is it noted the most?  ***  Fam hx of tremor?  {yes [id number]}no:314532}  Located where?  ***  Affected by caffeine:  {yes no:314532}  Affected by alcohol:  {yes no:314532}  Affected by stress:  {yes no:314532}  Affected by fatigue:  {yes no:314532}  Spills soup if on spoon:  {yes no:314532}  Spills glass of liquid if full:  {yes no:314532}  Affects ADL's (tying shoes, brushing teeth, etc):  {yes no:314532}   Family hx of similar:  {yes no:314532} Voice: *** Sleep: ***  Vivid Dreams:  {yes no:314532}  Acting out dreams:  {yes no:314532} Wet Pillows: {yes no:314532} Postural symptoms:  {yes no:314532}  Falls?  {yes no:314532} Bradykinesia symptoms: {parkinson brady:18041} Loss of smell:  {yes no:314532} Loss of taste:  {yes no:314532} Urinary Incontinence:  {yes no:314532} Difficulty Swallowing:  {yes no:314532} Handwriting, micrographia: {yes no:314532} Trouble with ADL's:  {yes no:314532}  Trouble buttoning clothing: {yes no:314532} Depression:  {yes no:314532} Memory changes:  {yes no:314532} Hallucinations:  {yes no:314532}  visual distortions: {yes no:314532} N/V:  {yes no:314532} Lightheaded:  {yes no:314532}  Syncope: {yes no:314532} Diplopia:  {yes no:314532} Dyskinesia:  {yes no:314532} Prior exposure to reglan/antipsychotics: {yes no:314532}   Patient had an MRI of the brain last completed in 2004.  There was mild small vessel disease.  The actual images are not available  PREVIOUS MEDICATIONS FOR TREMOR: Lovenia ShuckXANAX, ATIVAN, KLONOPIN TREMOR PROVOKING MEDS: RISPERDONE  ALLERGIES:   Allergies  Allergen Reactions  . Codeine Itching    CURRENT MEDICATIONS:  Outpatient Encounter Medications as of 06/13/2019  Medication Sig  . clopidogrel (PLAVIX) 75 MG tablet Take by mouth.  . escitalopram (LEXAPRO) 10 MG tablet Take 1/2 tablet by mouth daily for 7-10 days, then increase to 1 full tablet  . gabapentin (NEURONTIN) 300 MG capsule Take by mouth.  Marland Kitchen. LORazepam (ATIVAN) 0.5 MG tablet One by mouth up to three times daily for anxiety.  . metoprolol succinate (TOPROL-XL) 25 MG 24 hr tablet Take by mouth.  . mirtazapine (REMERON) 30 MG tablet TAKE 1 TABLET BY  MOUTH AT BEDTIME  . risperiDONE (RISPERDAL) 1 MG tablet Take 1/2 tablet by mouth in the morning, 1/2 tablet in the afternoon and 1 tablet at bedtime  . rosuvastatin (CRESTOR) 40 MG tablet Take 1 tablet (40 mg total) by mouth daily.   No facility-administered encounter medications on file as  of 06/13/2019.     PAST MEDICAL HISTORY:   Past Medical History:  Diagnosis Date  . Anxiety   . CAD (coronary artery disease)   . High cholesterol   . Hypertension     PAST SURGICAL HISTORY:   Past Surgical History:  Procedure Laterality Date  . COLONOSCOPY    . Heart  Stent  2009  . HERNIA REPAIR      SOCIAL HISTORY:   Social History   Socioeconomic History  . Marital status: Married    Spouse name: Not on file  . Number of children: 2  . Years of education: 47  . Highest education level: Not on file  Occupational History    Employer: [employer]  Social Needs  . Financial resource strain: Not on file  . Food insecurity:    Worry: Not on file    Inability: Not on file  . Transportation needs:    Medical: Not on file    Non-medical: Not on file  Tobacco Use  . Smoking status: Never Smoker  . Smokeless tobacco: Never Used  Substance and Sexual Activity  . Alcohol use: No  . Drug use: No  . Sexual activity: Not on file  Lifestyle  . Physical activity:    Days per week: Not on file    Minutes per session: Not on file  . Stress: Not on file  Relationships  . Social connections:    Talks on phone: Not on file    Gets together: Not on file    Attends religious service: Not on file    Active member of club or organization: Not on file    Attends meetings of clubs or organizations: Not on file    Relationship status: Not on file  . Intimate partner violence:    Fear of current or ex partner: Not on file    Emotionally abused: Not on file    Physically abused: Not on file    Forced sexual activity: Not on file  Other Topics Concern  . Not on file  Social History Narrative  . Not on file    FAMILY HISTORY:   Family Status  Relation Name Status  . Mother  Deceased  . Father  Deceased    ROS:  ROS  PHYSICAL EXAMINATION:    VITALS:  There were no vitals filed for this visit.  GEN:  The patient appears stated age and is in NAD.  Neurological  examination:  Orientation: The patient is alert and oriented x3. Fund of knowledge is appropriate.  Recent and remote memory are intact.  Attention and concentration are normal.    Able to name objects and repeat phrases. Cranial nerves: There is good facial symmetry. Extraocular muscles are intact. The visual fields are full to confrontational testing. The speech is fluent and clear. Soft palate rises symmetrically and there is no tongue deviation. Hearing is intact to conversational tone. Sensation: unable to test sensitive aspect via video Motor: Strength is at least antigravity x 4.   There is no pronator drift. Deep tendon reflexes: unable via video  Movement examination: Tone: unable Abnormal movements: *** Coordination:  There is *** decremation with  RAM's, *** Gait and Station: The patient has *** difficulty arising out of a deep-seated chair without the use of the hands. The patient's stride length is ***.    Lab Results  Component Value Date   TSH 1.184 12/06/2014     Chemistry      Component Value Date/Time   NA 142 12/15/2018 0912   K 4.4 12/15/2018 0912   CL 105 12/15/2018 0912   CO2 21 12/15/2018 0912   BUN 11 12/15/2018 0912   CREATININE 1.21 12/15/2018 0912   CREATININE 1.15 12/06/2014 0839      Component Value Date/Time   CALCIUM 9.9 12/15/2018 0912   ALKPHOS 77 12/15/2018 0912   AST 28 12/15/2018 0912   ALT 26 12/15/2018 0912   BILITOT <0.2 12/15/2018 0912       ASSESSMENT/PLAN:  ***1.  Tremor  -   Follow Up Instructions:    -I discussed the assessment and treatment plan with the patient. The patient was provided an opportunity to ask questions and all were answered. The patient agreed with the plan and demonstrated an understanding of the instructions.   The patient was advised to call back or seek an in-person evaluation if the symptoms worsen or if the condition fails to improve as anticipated.    Total Time spent in visit with the patient  was:  ***, of which more than 50% of the time was spent in counseling and/or coordinating care on ***.   Pt understands and agrees with the plan of care outlined.     Kerin Salenebecca Rossi Burdo, DO

## 2019-06-07 NOTE — Progress Notes (Signed)
Pt seen in consultation for the evaluation of tremor at the request of Luking, Vilinda BlanksWilliam S, MD.  The records that were made available to me were reviewed.  Only notes pertaining to tremor that I noted were from 2016 and it was felt tremor was related to anxiety.  He was recently seen by psychiatry for this.  He was started on Risperdal in November, and that was increased in December.  States that he has been off of it for a few months but it was changed to perphenazine.  Was tried on benztropine for tremor but taken off and recently given artane 2 mg, 4 tabs daily.  Specific Symptoms:  Tremor: Yes.     How long has it been going on?  Less than 1 year  At rest or with activation?  activation  When is it noted the most?  When trying to do something like putting a "bolt in something"  Fam hx of tremor?  No.  Located where?  Bilateral UE, L more than R (he is R hand dominant); some in voice  Affected by caffeine:  No. (3-4 cups coffee)  Affected by alcohol:  Doesn't drink any  Affected by stress:  Yes.    Affected by fatigue:  No.  Spills soup if on spoon:  Yes.    Spills glass of liquid if full:  No.  Affects ADL's (tying shoes, brushing teeth, etc):  No.   Voice: voice tremor  Sleep: sleeps well  Vivid Dreams:  No.  Acting out dreams:  No. Wet Pillows: No. Postural symptoms:  No.  Falls?  No. Bradykinesia symptoms: no bradykinesia noted Loss of smell:  No. Loss of taste:  No. Urinary Incontinence:  No. but has urgency Difficulty Swallowing:  No. Handwriting, micrographia: No. Depression: denies; states has anxiety Memory changes:  No. Hallucinations:  No.  visual distortions: No. N/V:  No. Lightheaded:  No.  Syncope: No. Diplopia:  No. Dyskinesia:  No. Prior exposure to reglan/antipsychotics: Yes.     Patient had an MRI of the brain last completed in 2004.  There was mild small vessel disease.  The actual images are not available  PREVIOUS MEDICATIONS FOR TREMOR: XANAX,  ATIVAN, KLONOPIN, BENZTROPINE (HELPED SOME); ARTANE TREMOR PROVOKING MEDS: RISPERDONE; PERPHENAZINE  ALLERGIES:   Allergies  Allergen Reactions  . Codeine Itching    CURRENT MEDICATIONS:  Outpatient Encounter Medications as of 06/12/2019  Medication Sig  . clopidogrel (PLAVIX) 75 MG tablet Take by mouth.  . gabapentin (NEURONTIN) 300 MG capsule Take by mouth.  Marland Kitchen. LORazepam (ATIVAN) 0.5 MG tablet One by mouth up to three times daily for anxiety.  . metoprolol succinate (TOPROL-XL) 25 MG 24 hr tablet Take by mouth.  . mirtazapine (REMERON) 30 MG tablet TAKE 1 TABLET BY MOUTH AT BEDTIME  . rosuvastatin (CRESTOR) 40 MG tablet Take 1 tablet (40 mg total) by mouth daily.  . sertraline (ZOLOFT) 100 MG tablet Take 100 mg by mouth daily. Take 2 tablets  . trihexyphenidyl (ARTANE) 2 MG tablet Take 2 mg by mouth daily. Take 4 tablets  . [DISCONTINUED] escitalopram (LEXAPRO) 10 MG tablet Take 1/2 tablet by mouth daily for 7-10 days, then increase to 1 full tablet  . [DISCONTINUED] risperiDONE (RISPERDAL) 1 MG tablet Take 1/2 tablet by mouth in the morning, 1/2 tablet in the afternoon and 1 tablet at bedtime   No facility-administered encounter medications on file as of 06/12/2019.     PAST MEDICAL HISTORY:   Past Medical History:  Diagnosis Date  . Anxiety   . CAD (coronary artery disease)   . High cholesterol   . Hypertension     PAST SURGICAL HISTORY:   Past Surgical History:  Procedure Laterality Date  . COLONOSCOPY    . CORONARY ANGIOPLASTY WITH STENT PLACEMENT  05/2018  . Heart  Stent  2009  . HERNIA REPAIR      SOCIAL HISTORY:   Social History   Socioeconomic History  . Marital status: Married    Spouse name: Not on file  . Number of children: 2  . Years of education: 3312  . Highest education level: Not on file  Occupational History    Employer: [employer]  Social Needs  . Financial resource strain: Not on file  . Food insecurity    Worry: Not on file    Inability:  Not on file  . Transportation needs    Medical: Not on file    Non-medical: Not on file  Tobacco Use  . Smoking status: Never Smoker  . Smokeless tobacco: Never Used  Substance and Sexual Activity  . Alcohol use: No  . Drug use: No  . Sexual activity: Not on file  Lifestyle  . Physical activity    Days per week: Not on file    Minutes per session: Not on file  . Stress: Not on file  Relationships  . Social Musicianconnections    Talks on phone: Not on file    Gets together: Not on file    Attends religious service: Not on file    Active member of club or organization: Not on file    Attends meetings of clubs or organizations: Not on file    Relationship status: Not on file  . Intimate partner violence    Fear of current or ex partner: Not on file    Emotionally abused: Not on file    Physically abused: Not on file    Forced sexual activity: Not on file  Other Topics Concern  . Not on file  Social History Narrative  . Not on file    FAMILY HISTORY:   Family Status  Relation Name Status  . Mother  Deceased  . Father  Deceased  . Son  (Not Specified)  . Brother  Deceased    ROS:  Review of Systems  Constitutional: Negative.   HENT: Negative.   Eyes: Negative.   Respiratory: Negative.   Cardiovascular: Negative.   Gastrointestinal: Negative.   Genitourinary: Negative.   Musculoskeletal: Negative.   Skin: Negative.   Neurological: Positive for tremors.  Psychiatric/Behavioral: The patient is nervous/anxious.     PHYSICAL EXAMINATION:    VITALS:   Vitals:   06/12/19 1053  BP: 140/76  Pulse: (!) 54  Temp: 98 F (36.7 C)  SpO2: 97%  Weight: 210 lb 3.2 oz (95.3 kg)  Height: 5\' 7"  (1.702 m)    GEN:  The patient appears stated age and is in NAD. Cardiovascular: Bradycardic.  Regular Lungs: Clear to auscultation bilaterally Neck: No carotid bruits bilaterally  Neurological examination:  Orientation: The patient is alert and oriented x3. Fund of knowledge is  appropriate.  Recent and remote memory are intact.  Attention and concentration are normal.    Able to name objects and repeat phrases. Cranial nerves: There is good facial symmetry.  There is facial hypomimia.  Extraocular muscles are intact. The visual fields are full to confrontational testing. The speech is fluent and clear. Soft palate rises symmetrically and there  ifs no tongue deviation. Hearing is intact to conversational tone. Sensation: unable to test sensitive aspect via video Motor: Strength is 5/5 in the bilateral upper and lower extremities.   There is no pronator drift. Deep tendon reflexes: 2/4 at the bilateral biceps, triceps, brachioradialis, patella and achilles.  Movement examination: Tone: Mild increased tone in the left upper extremity. Abnormal movements: there is LUE resting tremor.  He has both left upper extremity and right upper extremity resting tremor with distraction procedures.  He does have some postural tremor.  He also has some trouble with Archimedes spirals on the left.  He has some trouble pouring water from one glass to another, but part of this is because he does not hold both glasses and his hands, but rather leaves 1 of the glasses just sitting still on the counter. Coordination:  There is no decremation with RAM's, with any form of RAMS, including alternating supination and pronation of the forearm, hand opening and closing, finger taps, heel taps and toe taps. Gait and Station: The patient has no difficulty arising out of a deep-seated chair without the use of the hands. The patient's stride length is good.    Lab Results  Component Value Date   TSH 1.184 12/06/2014     Chemistry      Component Value Date/Time   NA 142 12/15/2018 0912   K 4.4 12/15/2018 0912   CL 105 12/15/2018 0912   CO2 21 12/15/2018 0912   BUN 11 12/15/2018 0912   CREATININE 1.21 12/15/2018 0912   CREATININE 1.15 12/06/2014 0839      Component Value Date/Time   CALCIUM 9.9  12/15/2018 0912   ALKPHOS 77 12/15/2018 0912   AST 28 12/15/2018 0912   ALT 26 12/15/2018 0912   BILITOT <0.2 12/15/2018 0912     Patient did have a TSH through [facility] on July 13, 2018 that was 1.70.  ASSESSMENT/PLAN:  1.  Parkinsonism  -I had a long counseling session with the patient today.  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  I explained that one clinically cannot tell the difference between idiopathic parkinsons disease and secondary parkinsonism from medication.  I also explained that even if one is able to get off of the medication, it can take up to 6 months to clinically definitively know if this is idiopathic parkinsons disease.  I did not advise that the patient go off of medication, as this needs to be discussed with the patients prescribing physician.  I did, however, tell the patient that the longer one is on the medication, the worse the symptoms can get.  The patient is to make an appointment with his psychiatrist to discuss what I have discussed with him.  It is is my suspicion that his psychiatrist already knows, as I can see in his last visit that "drug-induced parkinsonism" was one of his diagnoses.  -The patient may have other components of tremor as well, as he does have some intention tremor, but given the timeline of his symptoms of being less than 1 year, it is my suspicion that most of his symptoms are drug-induced.  He reports he has not been on medication for that long.   Follow Up Instructions:  As needed  -I discussed the assessment and treatment plan with the patient. The patient was provided an opportunity to ask questions and all were answered. The patient agreed with the plan and  demonstrated an understanding of the instructions.   The patient was advised to call back or seek an in-person evaluation if the symptoms worsen or if the condition fails to  improve as anticipated.    Total Time spent in visit with the patient was:  45 min, of which more than 50% of the time was spent in counseling and/or coordinating care.   Pt understands and agrees with the plan of care outlined.     Kerin Salenebecca Cutter Passey, DO

## 2019-06-12 ENCOUNTER — Other Ambulatory Visit: Payer: Self-pay

## 2019-06-12 ENCOUNTER — Ambulatory Visit: Payer: Medicare Other | Admitting: Neurology

## 2019-06-12 VITALS — BP 140/76 | HR 54 | Temp 98.0°F | Ht 67.0 in | Wt 210.2 lb

## 2019-06-12 DIAGNOSIS — G2111 Neuroleptic induced parkinsonism: Secondary | ICD-10-CM | POA: Diagnosis not present

## 2019-06-12 NOTE — Patient Instructions (Signed)
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

## 2019-06-13 ENCOUNTER — Telehealth: Payer: PRIVATE HEALTH INSURANCE | Admitting: Neurology

## 2019-07-05 ENCOUNTER — Telehealth: Payer: Self-pay | Admitting: Family Medicine

## 2019-07-05 DIAGNOSIS — E7800 Pure hypercholesterolemia, unspecified: Secondary | ICD-10-CM

## 2019-07-05 DIAGNOSIS — I10 Essential (primary) hypertension: Secondary | ICD-10-CM

## 2019-07-05 DIAGNOSIS — Z125 Encounter for screening for malignant neoplasm of prostate: Secondary | ICD-10-CM

## 2019-07-05 NOTE — Telephone Encounter (Signed)
Blood work ordered in Epic. Patient notified. 

## 2019-07-05 NOTE — Telephone Encounter (Signed)
Last labs 12/15/18: Lipid, Liver, Met 7, PSA

## 2019-07-05 NOTE — Telephone Encounter (Signed)
same

## 2019-07-05 NOTE — Telephone Encounter (Signed)
Pt has follow up on 8/12. Would like to know if Dr. Steve wants lab work done.  °

## 2019-07-06 DIAGNOSIS — F411 Generalized anxiety disorder: Secondary | ICD-10-CM | POA: Diagnosis not present

## 2019-07-06 DIAGNOSIS — I10 Essential (primary) hypertension: Secondary | ICD-10-CM | POA: Diagnosis not present

## 2019-07-06 DIAGNOSIS — F3341 Major depressive disorder, recurrent, in partial remission: Secondary | ICD-10-CM | POA: Diagnosis not present

## 2019-07-06 DIAGNOSIS — F410 Panic disorder [episodic paroxysmal anxiety] without agoraphobia: Secondary | ICD-10-CM | POA: Diagnosis not present

## 2019-07-09 ENCOUNTER — Ambulatory Visit: Payer: Medicare Other | Admitting: Family Medicine

## 2019-07-27 DIAGNOSIS — F411 Generalized anxiety disorder: Secondary | ICD-10-CM | POA: Diagnosis not present

## 2019-07-27 DIAGNOSIS — F410 Panic disorder [episodic paroxysmal anxiety] without agoraphobia: Secondary | ICD-10-CM | POA: Diagnosis not present

## 2019-07-27 DIAGNOSIS — Z125 Encounter for screening for malignant neoplasm of prostate: Secondary | ICD-10-CM | POA: Diagnosis not present

## 2019-07-27 DIAGNOSIS — I10 Essential (primary) hypertension: Secondary | ICD-10-CM | POA: Diagnosis not present

## 2019-07-27 DIAGNOSIS — F3341 Major depressive disorder, recurrent, in partial remission: Secondary | ICD-10-CM | POA: Diagnosis not present

## 2019-07-27 DIAGNOSIS — E7800 Pure hypercholesterolemia, unspecified: Secondary | ICD-10-CM | POA: Diagnosis not present

## 2019-07-28 LAB — BASIC METABOLIC PANEL
BUN/Creatinine Ratio: 14 (ref 10–24)
BUN: 18 mg/dL (ref 8–27)
CO2: 20 mmol/L (ref 20–29)
Calcium: 9.5 mg/dL (ref 8.6–10.2)
Chloride: 106 mmol/L (ref 96–106)
Creatinine, Ser: 1.31 mg/dL — ABNORMAL HIGH (ref 0.76–1.27)
GFR calc Af Amer: 63 mL/min/{1.73_m2} (ref >59)
GFR calc non Af Amer: 55 mL/min/{1.73_m2} — ABNORMAL LOW (ref >59)
Glucose: 107 mg/dL — ABNORMAL HIGH (ref 65–99)
Potassium: 4.5 mmol/L (ref 3.5–5.2)
Sodium: 143 mmol/L (ref 134–144)

## 2019-07-28 LAB — LIPID PANEL
Chol/HDL Ratio: 3.3 ratio (ref 0.0–5.0)
Cholesterol, Total: 122 mg/dL (ref 100–199)
HDL: 37 mg/dL — ABNORMAL LOW (ref >39)
LDL Calculated: 50 mg/dL (ref 0–99)
Triglycerides: 174 mg/dL — ABNORMAL HIGH (ref 0–149)
VLDL Cholesterol Cal: 35 mg/dL (ref 5–40)

## 2019-07-28 LAB — HEPATIC FUNCTION PANEL
ALT: 25 IU/L (ref 0–44)
AST: 23 IU/L (ref 0–40)
Albumin: 4.4 g/dL (ref 3.8–4.8)
Alkaline Phosphatase: 77 IU/L (ref 39–117)
Bilirubin Total: 0.2 mg/dL (ref 0.0–1.2)
Bilirubin, Direct: 0.07 mg/dL (ref 0.00–0.40)
Total Protein: 7.3 g/dL (ref 6.0–8.5)

## 2019-07-28 LAB — PSA: Prostate Specific Ag, Serum: 0.8 ng/mL (ref 0.0–4.0)

## 2019-08-07 ENCOUNTER — Other Ambulatory Visit: Payer: Self-pay

## 2019-08-08 ENCOUNTER — Ambulatory Visit (INDEPENDENT_AMBULATORY_CARE_PROVIDER_SITE_OTHER): Payer: Medicare Other | Admitting: Family Medicine

## 2019-08-08 VITALS — BP 132/74 | Temp 98.4°F

## 2019-08-08 DIAGNOSIS — R319 Hematuria, unspecified: Secondary | ICD-10-CM

## 2019-08-08 DIAGNOSIS — I10 Essential (primary) hypertension: Secondary | ICD-10-CM | POA: Diagnosis not present

## 2019-08-08 DIAGNOSIS — E785 Hyperlipidemia, unspecified: Secondary | ICD-10-CM

## 2019-08-08 DIAGNOSIS — E7800 Pure hypercholesterolemia, unspecified: Secondary | ICD-10-CM | POA: Diagnosis not present

## 2019-08-08 LAB — POCT URINALYSIS DIPSTICK
Blood, UA: POSITIVE
Spec Grav, UA: <=1.005 — AB (ref 1.010–1.025)
pH, UA: 6 (ref 5.0–8.0)

## 2019-08-08 MED ORDER — ROSUVASTATIN CALCIUM 40 MG PO TABS: 40.0000 mg | ORAL_TABLET | Freq: Every day | ORAL | 1 refills | Status: DC

## 2019-08-08 NOTE — Progress Notes (Signed)
Subjective:    Patient ID: Devin Daugherty, male    DOB: [birth year], 68 y.o.   MRN: [id number] Patient arrives with numerous concerns HPI Blood in semen 3 nights ago.   Results for orders placed or performed in visit on 08/08/19  POCT urinalysis dipstick  Result Value Ref Range   Color, UA     Clarity, UA     Glucose, UA     Bilirubin, UA     Ketones, UA     Spec Grav, UA <=1.005 (A) 1.010 - 1.025   Blood, UA positive    pH, UA 6.0 5.0 - 8.0   Protein, UA     Urobilinogen, UA     Nitrite, UA     Leukocytes, UA Small (1+) (A) Negative   Appearance     Odor     Results for orders placed or performed in visit on 08/08/19  POCT urinalysis dipstick  Result Value Ref Range   Color, UA     Clarity, UA     Glucose, UA     Bilirubin, UA     Ketones, UA     Spec Grav, UA <=1.005 (A) 1.010 - 1.025   Blood, UA positive    pH, UA 6.0 5.0 - 8.0   Protein, UA     Urobilinogen, UA     Nitrite, UA     Leukocytes, UA Small (1+) (A) Negative   Appearance     Odor     Recent Results (from the past 2160 hour(s))  Lipid panel     Status: Abnormal   Collection Time: 07/27/19  9:23 AM  Result Value Ref Range   Cholesterol, Total 122 100 - 199 mg/dL   Triglycerides 409174 (H) 0 - 149 mg/dL   HDL 37 (L) >81>39 mg/dL   VLDL Cholesterol Cal 35 5 - 40 mg/dL   LDL Calculated 50 0 - 99 mg/dL   Chol/HDL Ratio 3.3 0.0 - 5.0 ratio    Comment:                                   T. Chol/HDL Ratio                                             Men  Women                               1/2 Avg.Risk  3.4    3.3                                   Avg.Risk  5.0    4.4                                2X Avg.Risk  9.6    7.1                                3X Avg.Risk 23.4   11.0   Hepatic function panel     Status: None   Collection Time: 07/27/19  9:23 AM  Result Value Ref Range   Total Protein 7.3 6.0 - 8.5 g/dL   Albumin 4.4 3.8 - 4.8 g/dL   Bilirubin Total 0.2 0.0 - 1.2 mg/dL   Bilirubin, Direct  1.610.07 0.00 - 0.40 mg/dL   Alkaline Phosphatase 77 39 - 117 IU/L   AST 23 0 - 40 IU/L   ALT 25 0 - 44 IU/L  Basic metabolic panel     Status: Abnormal   Collection Time: 07/27/19  9:23 AM  Result Value Ref Range   Glucose 107 (H) 65 - 99 mg/dL   BUN 18 8 - 27 mg/dL   Creatinine, Ser 0.961.31 (H) 0.76 - 1.27 mg/dL   GFR calc non Af Amer 55 (L) >59 mL/min/1.73   GFR calc Af Amer 63 >59 mL/min/1.73   BUN/Creatinine Ratio 14 10 - 24   Sodium 143 134 - 144 mmol/L   Potassium 4.5 3.5 - 5.2 mmol/L   Chloride 106 96 - 106 mmol/L   CO2 20 20 - 29 mmol/L   Calcium 9.5 8.6 - 10.2 mg/dL  PSA     Status: None   Collection Time: 07/27/19  9:23 AM  Result Value Ref Range   Prostate Specific Ag, Serum 0.8 0.0 - 4.0 ng/mL    Comment: Roche ECLIA methodology. According to the American Urological Association, Serum PSA should decrease and remain at undetectable levels after radical prostatectomy. The AUA defines biochemical recurrence as an initial PSA value 0.2 ng/mL or greater followed by a subsequent confirmatory PSA value 0.2 ng/mL or greater. Values obtained with different assay methods or kits cannot be used interchangeably. Results cannot be interpreted as absolute evidence of the presence or absence of malignant disease.   POCT urinalysis dipstick     Status: Abnormal   Collection Time: 08/08/19 10:16 AM  Result Value Ref Range   Color, UA     Clarity, UA     Glucose, UA     Bilirubin, UA     Ketones, UA     Spec Grav, UA <=1.005 (A) 1.010 - 1.025   Blood, UA positive    pH, UA 6.0 5.0 - 8.0   Protein, UA     Urobilinogen, UA     Nitrite, UA     Leukocytes, UA Small (1+) (A) Negative   Appearance     Odor       Review of Systems No headache, no major weight loss or weight gain, no chest pain no back pain abdominal pain no change in bowel habits complete ROS otherwise negative     Objective:   Physical Exam  Alert and oriented, vitals reviewed and stable, NAD ENT-TM's and  ext canals WNL bilat via otoscopic exam Soft palate, tonsils and post pharynx WNL via oropharyngeal exam Neck-symmetric, no masses; thyroid nonpalpable and nontender Pulmonary-no tachypnea or accessory muscle use; Clear without wheezes via auscultation Card--no abnrml murmurs, rhythm reg and rate WNL Carotid pulses symmetric, without bruits  Urinalysis 0 red blood cells rare white blood cell rare epithelial cells      Assessment & Plan:  Impression 1 hematospermia discussed at length.  Isolated.  Recommended no further work-up at this time.  If it were to recur consider urology referral discussed  2.  Hyperlipidemia.  Numbers reviewed.  Diet discussed compliance with meds discussed  3.  Stage I renal insufficiency.  Discussed.  Fairly stable similar to levels 2 years ago  4.  Prediabetes discussed diet discussed exercise discussed  5.  Hypertension good control discussed  Greater than 50% of this 25 minute face to face visit was spent in counseling and discussion and coordination of care regarding the above diagnosis/diagnosies

## 2019-08-08 NOTE — Patient Instructions (Signed)
Isolated blood in the sperm only is known as hematospermia  Experts recommend no work up for this because highly highly likely not a problem   If this were to continu, I would encourage you to call us back and we will set you up with a urologist of your choice

## 2019-08-09 DIAGNOSIS — F3341 Major depressive disorder, recurrent, in partial remission: Secondary | ICD-10-CM | POA: Diagnosis not present

## 2019-08-09 DIAGNOSIS — I10 Essential (primary) hypertension: Secondary | ICD-10-CM | POA: Diagnosis not present

## 2019-08-09 DIAGNOSIS — F411 Generalized anxiety disorder: Secondary | ICD-10-CM | POA: Diagnosis not present

## 2019-08-09 DIAGNOSIS — F410 Panic disorder [episodic paroxysmal anxiety] without agoraphobia: Secondary | ICD-10-CM | POA: Diagnosis not present

## 2019-08-22 ENCOUNTER — Encounter (HOSPITAL_COMMUNITY): Payer: Self-pay

## 2019-08-22 ENCOUNTER — Other Ambulatory Visit: Payer: Self-pay

## 2019-08-22 ENCOUNTER — Emergency Department (HOSPITAL_COMMUNITY)
Admission: EM | Admit: 2019-08-22 | Discharge: 2019-08-22 | Disposition: A | Discharge status: Home / Self Care | Payer: Medicare Other | Attending: Emergency Medicine | Admitting: Emergency Medicine

## 2019-08-22 DIAGNOSIS — I2510 Atherosclerotic heart disease of native coronary artery without angina pectoris: Secondary | ICD-10-CM | POA: Diagnosis not present

## 2019-08-22 DIAGNOSIS — I10 Essential (primary) hypertension: Secondary | ICD-10-CM | POA: Diagnosis not present

## 2019-08-22 DIAGNOSIS — G20 Parkinson's disease: Secondary | ICD-10-CM | POA: Insufficient documentation

## 2019-08-22 DIAGNOSIS — E871 Hypo-osmolality and hyponatremia: Secondary | ICD-10-CM | POA: Diagnosis not present

## 2019-08-22 LAB — URINALYSIS, ROUTINE W REFLEX MICROSCOPIC
Bacteria, UA: NONE SEEN
Bilirubin Urine: NEGATIVE
Glucose, UA: NEGATIVE mg/dL
Ketones, ur: NEGATIVE mg/dL
Leukocytes,Ua: NEGATIVE
Nitrite: NEGATIVE
Protein, ur: NEGATIVE mg/dL
Specific Gravity, Urine: 1.012 (ref 1.005–1.030)
pH: 7 (ref 5.0–8.0)

## 2019-08-22 LAB — CBC WITH DIFFERENTIAL/PLATELET
Abs Immature Granulocytes: 0.05 10*3/uL (ref 0.00–0.07)
Basophils Absolute: 0.1 10*3/uL (ref 0.0–0.1)
Basophils Relative: 1 %
Eosinophils Absolute: 0.1 10*3/uL (ref 0.0–0.5)
Eosinophils Relative: 2 %
HCT: 39.3 % (ref 39.0–52.0)
Hemoglobin: 13.7 g/dL (ref 13.0–17.0)
Immature Granulocytes: 1 %
Lymphocytes Relative: 27 %
Lymphs Abs: 1.9 10*3/uL (ref 0.7–4.0)
MCH: 29.7 pg (ref 26.0–34.0)
MCHC: 34.9 g/dL (ref 30.0–36.0)
MCV: 85.1 fL (ref 80.0–100.0)
Monocytes Absolute: 0.9 10*3/uL (ref 0.1–1.0)
Monocytes Relative: 12 %
Neutro Abs: 4.1 10*3/uL (ref 1.7–7.7)
Neutrophils Relative %: 57 %
Platelets: 209 10*3/uL (ref 150–400)
RBC: 4.62 MIL/uL (ref 4.22–5.81)
RDW: 12.2 % (ref 11.5–15.5)
WBC: 7.1 10*3/uL (ref 4.0–10.5)
nRBC: 0 % (ref 0.0–0.2)

## 2019-08-22 LAB — BASIC METABOLIC PANEL
Anion gap: 8 (ref 5–15)
BUN: 13 mg/dL (ref 8–23)
CO2: 21 mmol/L — ABNORMAL LOW (ref 22–32)
Calcium: 8.4 mg/dL — ABNORMAL LOW (ref 8.9–10.3)
Chloride: 94 mmol/L — ABNORMAL LOW (ref 98–111)
Creatinine, Ser: 1.05 mg/dL (ref 0.61–1.24)
GFR calc Af Amer: >60 mL/min (ref >60)
GFR calc non Af Amer: >60 mL/min (ref >60)
Glucose, Bld: 90 mg/dL (ref 70–99)
Potassium: 3.8 mmol/L (ref 3.5–5.1)
Sodium: 123 mmol/L — ABNORMAL LOW (ref 135–145)

## 2019-08-22 LAB — TROPONIN I (HIGH SENSITIVITY)
Troponin I (High Sensitivity): 3 ng/L (ref <18)
Troponin I (High Sensitivity): 3 ng/L (ref <18)

## 2019-08-22 MED ORDER — CLONIDINE HCL 0.1 MG PO TABS
0.1000 mg | ORAL_TABLET | Freq: Once | ORAL | Status: AC
  Administered 2019-08-22: 0.1 mg via ORAL
  Filled 2019-08-22: qty 1

## 2019-08-22 MED ORDER — CLONIDINE HCL 0.1 MG PO TABS: 0.1000 mg | ORAL_TABLET | Freq: Two times a day (BID) | ORAL | 1 refills | Status: DC | PRN

## 2019-08-22 NOTE — ED Provider Notes (Signed)
Clonidine 0.1 mg administered for blood pressure control.  Pressure reduced to 148/90.  Sodium 123.  These findings were discussed with the patient and his wife.  He will follow-up with his cardiologist for further hypertension management issues.  Also needs his sodium rechecked.  Clonidine 0.1 mg (#10) called into his pharmacy.   Nat Christen, MD 08/22/19 [phone]

## 2019-08-22 NOTE — ED Triage Notes (Signed)
Pt reports that his BP is high today 200/106 and he has been taking his meds as usual. No complaints

## 2019-08-22 NOTE — ED Provider Notes (Signed)
Emergency Department Provider Note   I have reviewed the triage vital signs and the nursing notes.   HISTORY  Chief Complaint Hypertension   HPI Devin Daugherty is a 68 y.o. male with PMH HTN, HLD, and CAD s/p stenting in 2009 presents to the emergency department for evaluation of asymptomatic hypertension.  Patient states that in the past his blood pressures been well controlled with typical readings in the 110-125 systolic range both in the PCP office and at home.  Over the past several days the patient has been checking his blood pressure at home and getting systolic pressures of 160-200s.  He denies any headache, vision change, speech change, weakness/numbness.  No chest pain, shortness of breath. He called his Cardiology office and was referred to the ED by the nurse in the office. No changes recently to his BP medication.    Past Medical History:  Diagnosis Date  . Anxiety   . CAD (coronary artery disease)   . High cholesterol   . Hypertension     Patient Active Problem List   Diagnosis Date Noted  . Neuroleptic-induced Parkinsonism (HCC) 06/12/2019  . Essential tremor 07/04/2017  . Esophageal reflux 12/24/2015  . Bilateral hydrocele 11/10/2014  . Insomnia 10/21/2013  . Atherosclerotic heart disease 10/21/2013  . Hypertension 06/26/2013  . Hypercholesterolemia 06/26/2013  . Anxiety 06/26/2013  . Small fiber neuropathy 06/26/2013    Past Surgical History:  Procedure Laterality Date  . COLONOSCOPY    . CORONARY ANGIOPLASTY WITH STENT PLACEMENT  05/2018  . Heart  Stent  2009  . HERNIA REPAIR      Allergies Codeine  Family History  Problem Relation Age of Onset  . Heart attack Father   . Hyperlipidemia Son     Social History Social History   Tobacco Use  . Smoking status: Never Smoker  . Smokeless tobacco: Never Used  Substance Use Topics  . Alcohol use: No  . Drug use: No    Review of Systems  Constitutional: No fever/chills Eyes: No visual  changes. ENT: No sore throat. Cardiovascular: Denies chest pain. Positive elevated BP.  Respiratory: Denies shortness of breath. Gastrointestinal: No abdominal pain.  No nausea, no vomiting.  No diarrhea.  No constipation. Genitourinary: Negative for dysuria. Musculoskeletal: Negative for back pain. Skin: Negative for rash. Neurological: Negative for headaches, focal weakness or numbness.  10-point ROS otherwise negative.  ____________________________________________   PHYSICAL EXAM:  VITAL SIGNS: ED Triage Vitals  Enc Vitals Group     BP 08/22/19 1331 (!) 179/106     Pulse Rate 08/22/19 1331 60     Resp 08/22/19 1331 16     Temp 08/22/19 1331 97.9 F (36.6 C)     Temp Source 08/22/19 1331 Oral     SpO2 08/22/19 1331 98 %     Weight 08/22/19 1331 205 lb (93 kg)     Height 08/22/19 1331 5\' 8"  (1.727 m)   Constitutional: Alert and oriented. Well appearing and in no acute distress. Eyes: Conjunctivae are normal.  Head: Atraumatic. Nose: No congestion/rhinnorhea. Mouth/Throat: Mucous membranes are moist. Neck: No stridor.   Cardiovascular: Normal rate, regular rhythm. Good peripheral circulation. Grossly normal heart sounds.   Respiratory: Normal respiratory effort.  No retractions. Lungs CTAB. Gastrointestinal: Soft and nontender. No distention.  Musculoskeletal: No lower extremity tenderness nor edema. No gross deformities of extremities. Neurologic:  Normal speech and language. No gross focal neurologic deficits are appreciated. Normal CN exam 2-12.  Skin:  Skin is  warm, dry and intact. No rash noted.  ____________________________________________   LABS (all labs ordered are listed, but only abnormal results are displayed)  Labs Reviewed  BASIC METABOLIC PANEL - Abnormal; Notable for the following components:      Result Value   Sodium 123 (*)    Chloride 94 (*)    CO2 21 (*)    Calcium 8.4 (*)    All other components within normal limits  CBC WITH  DIFFERENTIAL/PLATELET  URINALYSIS, ROUTINE W REFLEX MICROSCOPIC  TROPONIN I (HIGH SENSITIVITY)  TROPONIN I (HIGH SENSITIVITY)   ____________________________________________  EKG   EKG Interpretation  Date/Time:  Wednesday August 22 2019 13:55:55 EDT Ventricular Rate:  57 PR Interval:    QRS Duration: 102 QT Interval:  429 QTC Calculation: 418 R Axis:   34 Text Interpretation:  Sinus rhythm Minimal ST elevation, anterior leads Baseline wander in lead(s) V1 V3 V4 No STEMI  Confirmed by Nanda Quinton [phone]) on 08/22/2019 2:03:33 PM       ____________________________________________  RADIOLOGY  None ____________________________________________   PROCEDURES  Procedure(s) performed:   Procedures  None ____________________________________________   INITIAL IMPRESSION / ASSESSMENT AND PLAN / ED COURSE  Pertinent labs & imaging results that were available during my care of the patient were reviewed by me and considered in my medical decision making (see chart for details).   Patient presents to the emergency department with asymptomatic hypertension.  Blood pressure on arrival here is 179/106.  No findings on history or exam to strongly suspect hypertensive emergency.  Plan for screening lab work.  EKG reviewed as above with no acute findings.   CBC reviewed. Remaining labs are pending. Care transferred to Dr. Lacinda Axon. Will likely discharge with plan for sodium reduction in diet and close PCP/Cardiology follow up rather than adding medication in the ED.  ____________________________________________  FINAL CLINICAL IMPRESSION(S) / ED DIAGNOSES  Final diagnoses:  Essential hypertension    MEDICATIONS GIVEN DURING THIS VISIT:  Medications  cloNIDine (CATAPRES) tablet 0.1 mg (0.1 mg Oral Given 08/22/19 1759)    Note:  This document was prepared using Dragon voice recognition software and may include unintentional dictation errors.  Nanda Quinton, MD Emergency Medicine     Devin Daugherty, [facility], MD 08/22/19 [phone]

## 2019-08-22 NOTE — Discharge Instructions (Addendum)
Initial blood pressure today was 179/106.  At discharge, it was 148/90.  You received clonidine 0.1 mg in the emergency department which seemed to help your blood pressure.  Small prescription for same called into your pharmacy.  Sodium today was 123.  This will need to be rechecked.  Follow-up with your cardiologist or primary care doctor for further recommendations.

## 2019-08-30 DIAGNOSIS — F411 Generalized anxiety disorder: Secondary | ICD-10-CM | POA: Diagnosis not present

## 2019-09-10 DIAGNOSIS — F411 Generalized anxiety disorder: Secondary | ICD-10-CM | POA: Diagnosis not present

## 2019-09-10 DIAGNOSIS — F410 Panic disorder [episodic paroxysmal anxiety] without agoraphobia: Secondary | ICD-10-CM | POA: Diagnosis not present

## 2019-09-10 DIAGNOSIS — F3341 Major depressive disorder, recurrent, in partial remission: Secondary | ICD-10-CM | POA: Diagnosis not present

## 2019-09-10 DIAGNOSIS — I10 Essential (primary) hypertension: Secondary | ICD-10-CM | POA: Diagnosis not present

## 2019-09-13 DIAGNOSIS — I2510 Atherosclerotic heart disease of native coronary artery without angina pectoris: Secondary | ICD-10-CM | POA: Diagnosis not present

## 2019-09-13 DIAGNOSIS — I10 Essential (primary) hypertension: Secondary | ICD-10-CM | POA: Diagnosis not present

## 2019-09-20 DIAGNOSIS — I2510 Atherosclerotic heart disease of native coronary artery without angina pectoris: Secondary | ICD-10-CM | POA: Diagnosis not present

## 2019-09-20 DIAGNOSIS — E782 Mixed hyperlipidemia: Secondary | ICD-10-CM | POA: Diagnosis not present

## 2019-09-20 DIAGNOSIS — I10 Essential (primary) hypertension: Secondary | ICD-10-CM | POA: Diagnosis not present

## 2019-09-20 DIAGNOSIS — Z955 Presence of coronary angioplasty implant and graft: Secondary | ICD-10-CM | POA: Diagnosis not present

## 2019-10-03 DIAGNOSIS — I2510 Atherosclerotic heart disease of native coronary artery without angina pectoris: Secondary | ICD-10-CM | POA: Diagnosis not present

## 2019-10-03 DIAGNOSIS — G4700 Insomnia, unspecified: Secondary | ICD-10-CM | POA: Diagnosis not present

## 2019-10-03 DIAGNOSIS — F419 Anxiety disorder, unspecified: Secondary | ICD-10-CM | POA: Diagnosis not present

## 2019-10-03 DIAGNOSIS — I10 Essential (primary) hypertension: Secondary | ICD-10-CM | POA: Diagnosis not present

## 2019-10-10 DIAGNOSIS — F411 Generalized anxiety disorder: Secondary | ICD-10-CM | POA: Diagnosis not present

## 2019-10-10 DIAGNOSIS — F3341 Major depressive disorder, recurrent, in partial remission: Secondary | ICD-10-CM | POA: Diagnosis not present

## 2019-10-10 DIAGNOSIS — E559 Vitamin D deficiency, unspecified: Secondary | ICD-10-CM | POA: Diagnosis not present

## 2019-10-10 DIAGNOSIS — I10 Essential (primary) hypertension: Secondary | ICD-10-CM | POA: Diagnosis not present

## 2019-10-17 DIAGNOSIS — E559 Vitamin D deficiency, unspecified: Secondary | ICD-10-CM | POA: Diagnosis not present

## 2019-10-17 DIAGNOSIS — G3184 Mild cognitive impairment, so stated: Secondary | ICD-10-CM | POA: Diagnosis not present

## 2019-11-06 DIAGNOSIS — F419 Anxiety disorder, unspecified: Secondary | ICD-10-CM | POA: Diagnosis not present

## 2019-11-06 DIAGNOSIS — I10 Essential (primary) hypertension: Secondary | ICD-10-CM | POA: Diagnosis not present

## 2019-11-06 DIAGNOSIS — G4700 Insomnia, unspecified: Secondary | ICD-10-CM | POA: Diagnosis not present

## 2019-11-06 DIAGNOSIS — I2510 Atherosclerotic heart disease of native coronary artery without angina pectoris: Secondary | ICD-10-CM | POA: Diagnosis not present

## 2019-12-04 ENCOUNTER — Ambulatory Visit (INDEPENDENT_AMBULATORY_CARE_PROVIDER_SITE_OTHER): Payer: Medicare Other | Admitting: Family Medicine

## 2019-12-04 ENCOUNTER — Other Ambulatory Visit: Payer: Self-pay

## 2019-12-04 ENCOUNTER — Telehealth: Payer: Self-pay | Admitting: Family Medicine

## 2019-12-04 DIAGNOSIS — K529 Noninfective gastroenteritis and colitis, unspecified: Secondary | ICD-10-CM

## 2019-12-04 MED ORDER — HYOSCYAMINE SULFATE SL 0.125 MG SL SUBL: SUBLINGUAL_TABLET | SUBLINGUAL | 0 refills | Status: DC

## 2019-12-04 NOTE — Telephone Encounter (Signed)
Wife called stating patient has had diarrhea for 3 days now no other symptoms. He is wanting something called into [facility]

## 2019-12-04 NOTE — Progress Notes (Signed)
   Subjective:  Audio only  Patient ID: Devin Daugherty, male    DOB: [birth year], 68 y.o.   MRN: [id number]  HPI  Patient calls with diarrhea for a few days. Patient states he is also having a lot of cramping and is going every hour. Patient states it is like water. Patient reports no other symptoms.  Virtual Visit via Video Note  I connected with AMY BELLOSO on 12/04/19 at  3:00 PM EST by a video enabled telemedicine application and verified that I am speaking with the correct person using two identifiers.  Location: Patient: home Provider: office   I discussed the limitations of evaluation and management by telemedicine and the availability of in person appointments. The patient expressed understanding and agreed to proceed.  History of Present Illness:    Observations/Objective:   Assessment and Plan:   Follow Up Instructions:    I discussed the assessment and treatment plan with the patient. The patient was provided an opportunity to ask questions and all were answered. The patient agreed with the plan and demonstrated an understanding of the instructions.   The patient was advised to call back or seek an in-person evaluation if the symptoms worsen or if the condition fails to improve as anticipated.  I provided 18 minutes of non-face-to-face time during this encounter.   Diarrhea 2 3 d ago  Pains in stomach  Bad cramping pain  No abnorma fever, resp symptoms      Review of Systems No high fevers no chest pain no headache no cough    Objective:   Physical Exam   Virtual     Assessment & Plan:  Impression acute gastroenteritis with impressive symptomatology.  Diet discussed.  Imodium as needed.  Add Levsin as needed for cramps.  Warning signs discussed carefully.

## 2019-12-04 NOTE — Telephone Encounter (Signed)
Patient scheduled virtual visit to discuss today 12/04/2019 with Dr Richardson Landry.

## 2019-12-05 ENCOUNTER — Encounter: Payer: Self-pay | Admitting: Family Medicine

## 2019-12-07 DIAGNOSIS — F411 Generalized anxiety disorder: Secondary | ICD-10-CM | POA: Diagnosis not present

## 2019-12-07 DIAGNOSIS — F1320 Sedative, hypnotic or anxiolytic dependence, uncomplicated: Secondary | ICD-10-CM | POA: Diagnosis not present

## 2019-12-07 DIAGNOSIS — F3341 Major depressive disorder, recurrent, in partial remission: Secondary | ICD-10-CM | POA: Diagnosis not present

## 2019-12-07 DIAGNOSIS — F410 Panic disorder [episodic paroxysmal anxiety] without agoraphobia: Secondary | ICD-10-CM | POA: Diagnosis not present

## 2020-01-28 ENCOUNTER — Telehealth: Payer: Self-pay | Admitting: Family Medicine

## 2020-01-28 ENCOUNTER — Encounter: Payer: Self-pay | Admitting: Gastroenterology

## 2020-01-28 NOTE — Telephone Encounter (Signed)
Patient wanting to know if you can prescribe something for ED. Please advise-[facility]

## 2020-01-29 NOTE — Telephone Encounter (Signed)
Pt.notified

## 2020-01-29 NOTE — Telephone Encounter (Signed)
Will disc at visit on the 12th

## 2020-01-30 ENCOUNTER — Telehealth: Payer: Self-pay | Admitting: Family Medicine

## 2020-01-30 DIAGNOSIS — I10 Essential (primary) hypertension: Secondary | ICD-10-CM

## 2020-01-30 DIAGNOSIS — Z125 Encounter for screening for malignant neoplasm of prostate: Secondary | ICD-10-CM

## 2020-01-30 DIAGNOSIS — E7800 Pure hypercholesterolemia, unspecified: Secondary | ICD-10-CM

## 2020-01-30 DIAGNOSIS — Z79899 Other long term (current) drug therapy: Secondary | ICD-10-CM

## 2020-01-30 NOTE — Telephone Encounter (Signed)
Last labs 07/27/19 lipid, liver, bmp, psa

## 2020-01-30 NOTE — Telephone Encounter (Signed)
Orders put in and pt notified.  

## 2020-01-30 NOTE — Telephone Encounter (Signed)
same

## 2020-01-30 NOTE — Telephone Encounter (Signed)
Patient has phone visit for 6 month follow up on 2/12 and wanting to know if he needs labs done

## 2020-01-31 ENCOUNTER — Encounter: Payer: Self-pay | Admitting: Family Medicine

## 2020-01-31 DIAGNOSIS — Z79899 Other long term (current) drug therapy: Secondary | ICD-10-CM | POA: Diagnosis not present

## 2020-01-31 DIAGNOSIS — I10 Essential (primary) hypertension: Secondary | ICD-10-CM | POA: Diagnosis not present

## 2020-01-31 DIAGNOSIS — Z125 Encounter for screening for malignant neoplasm of prostate: Secondary | ICD-10-CM | POA: Diagnosis not present

## 2020-01-31 DIAGNOSIS — E7800 Pure hypercholesterolemia, unspecified: Secondary | ICD-10-CM | POA: Diagnosis not present

## 2020-02-01 LAB — BASIC METABOLIC PANEL
BUN/Creatinine Ratio: 12 (ref 10–24)
BUN: 20 mg/dL (ref 8–27)
CO2: 19 mmol/L — ABNORMAL LOW (ref 20–29)
Calcium: 9.2 mg/dL (ref 8.6–10.2)
Chloride: 106 mmol/L (ref 96–106)
Creatinine, Ser: 1.65 mg/dL — ABNORMAL HIGH (ref 0.76–1.27)
GFR calc Af Amer: 48 mL/min/{1.73_m2} — ABNORMAL LOW (ref >59)
GFR calc non Af Amer: 41 mL/min/{1.73_m2} — ABNORMAL LOW (ref >59)
Glucose: 101 mg/dL — ABNORMAL HIGH (ref 65–99)
Potassium: 4.7 mmol/L (ref 3.5–5.2)
Sodium: 141 mmol/L (ref 134–144)

## 2020-02-01 LAB — PSA: Prostate Specific Ag, Serum: 0.8 ng/mL (ref 0.0–4.0)

## 2020-02-01 LAB — HEPATIC FUNCTION PANEL
ALT: 21 IU/L (ref 0–44)
AST: 19 IU/L (ref 0–40)
Albumin: 4.3 g/dL (ref 3.8–4.8)
Alkaline Phosphatase: 73 IU/L (ref 39–117)
Bilirubin Total: <0.2 mg/dL (ref 0.0–1.2)
Bilirubin, Direct: 0.05 mg/dL (ref 0.00–0.40)
Total Protein: 7.5 g/dL (ref 6.0–8.5)

## 2020-02-01 LAB — LIPID PANEL
Chol/HDL Ratio: 3.6 ratio (ref 0.0–5.0)
Cholesterol, Total: 155 mg/dL (ref 100–199)
HDL: 43 mg/dL (ref >39)
LDL Chol Calc (NIH): 81 mg/dL (ref 0–99)
Triglycerides: 185 mg/dL — ABNORMAL HIGH (ref 0–149)
VLDL Cholesterol Cal: 31 mg/dL (ref 5–40)

## 2020-02-08 ENCOUNTER — Ambulatory Visit (INDEPENDENT_AMBULATORY_CARE_PROVIDER_SITE_OTHER): Payer: Medicare Other | Admitting: Family Medicine

## 2020-02-08 ENCOUNTER — Other Ambulatory Visit: Payer: Self-pay

## 2020-02-08 DIAGNOSIS — I10 Essential (primary) hypertension: Secondary | ICD-10-CM

## 2020-02-08 DIAGNOSIS — N5201 Erectile dysfunction due to arterial insufficiency: Secondary | ICD-10-CM | POA: Diagnosis not present

## 2020-02-08 DIAGNOSIS — E7800 Pure hypercholesterolemia, unspecified: Secondary | ICD-10-CM

## 2020-02-08 MED ORDER — ROSUVASTATIN CALCIUM 40 MG PO TABS: 40.0000 mg | ORAL_TABLET | Freq: Every day | ORAL | 1 refills | Status: DC

## 2020-02-08 MED ORDER — SILDENAFIL CITRATE 20 MG PO TABS: ORAL_TABLET | ORAL | 5 refills | Status: DC

## 2020-02-08 NOTE — Progress Notes (Signed)
Subjective:    Patient ID: Devin Daugherty, male    DOB: 1951/11/05, 69 y.o.   MRN: [id number]  HPI Pt here for 6 month follow up. Pt states he is doing well except for having some issues with ED. Pt states this issue just began.   Virtual Visit via Telephone Note  I connected with Devin Daugherty on 02/08/20 at  9:00 AM EST by telephone and verified that I am speaking with the correct person using two identifiers.  Location: Patient: home Provider: office   I discussed the limitations, risks, security and privacy concerns of performing an evaluation and management service by telephone and the availability of in person appointments. I also discussed with the patient that there may be a patient responsible charge related to this service. The patient expressed understanding and agreed to proceed.   History of Present Illness:    Observations/Objective:   Assessment and Plan:   Follow Up Instructions:    I discussed the assessment and treatment plan with the patient. The patient was provided an opportunity to ask questions and all were answered. The patient agreed with the plan and demonstrated an understanding of the instructions.   The patient was advised to call back or seek an in-person evaluation if the symptoms worsen or if the condition fails to improve as anticipated.  I provided 24 minutes of non-face-to-face time during this encounter.  Results for orders placed or performed in visit on 01/30/20  Lipid panel  Result Value Ref Range   Cholesterol, Total 155 100 - 199 mg/dL   Triglycerides 185 (H) 0 - 149 mg/dL   HDL 43 >39 mg/dL   VLDL Cholesterol Cal 31 5 - 40 mg/dL   LDL Chol Calc (NIH) 81 0 - 99 mg/dL   Chol/HDL Ratio 3.6 0.0 - 5.0 ratio  Hepatic function panel  Result Value Ref Range   Total Protein 7.5 6.0 - 8.5 g/dL   Albumin 4.3 3.8 - 4.8 g/dL   Bilirubin Total <0.2 0.0 - 1.2 mg/dL   Bilirubin, Direct 0.05 0.00 - 0.40 mg/dL   Alkaline Phosphatase  73 39 - 117 IU/L   AST 19 0 - 40 IU/L   ALT 21 0 - 44 IU/L  Basic metabolic panel  Result Value Ref Range   Glucose 101 (H) 65 - 99 mg/dL   BUN 20 8 - 27 mg/dL   Creatinine, Ser 1.65 (H) 0.76 - 1.27 mg/dL   GFR calc non Af Amer 41 (L) >59 mL/min/1.73   GFR calc Af Amer 48 (L) >59 mL/min/1.73   BUN/Creatinine Ratio 12 10 - 24   Sodium 141 134 - 144 mmol/L   Potassium 4.7 3.5 - 5.2 mmol/L   Chloride 106 96 - 106 mmol/L   CO2 19 (L) 20 - 29 mmol/L   Calcium 9.2 8.6 - 10.2 mg/dL  PSA  Result Value Ref Range   Prostate Specific Ag, Serum 0.8 0.0 - 4.0 ng/mL     Patient continues to take lipid medication regularly. No obvious side effects from it. Generally does not miss a dose. Prior blood work results are reviewed with patient. Patient continues to work on fat intake in diet Patient followed by specialist for his chronic anxiety issues.  Admits to not drinking water on a regular basis.   Review of Systems No headache, no major weight loss or weight gain, no chest pain no back pain abdominal pain no change in bowel habits complete ROS otherwise negative  Objective:   Physical Exam Virtual       Assessment & Plan:  Impression 1 hyperlipidemia.  Blood work reviewed.  Good control maintain same meds  2.  Erectile dysfunction.  Discussed at length.  Progressive issue with the past year.  Will initiate generic sildenafil.  3.  Coronary artery disease followed by specialist.  4.  Persistent substantial anxiety.  Followed by specialist  5.  Renal insufficiency progressive in nature.  Encouraged to increase fluid intake.  If continues to decline will need to see a nephrologist.  We can check this in 6 months along with other blood work  Medications refilled.  Sildenafil prescribed.  Proper use discussed

## 2020-03-10 DIAGNOSIS — R079 Chest pain, unspecified: Secondary | ICD-10-CM | POA: Diagnosis not present

## 2020-03-10 DIAGNOSIS — Z885 Allergy status to narcotic agent status: Secondary | ICD-10-CM | POA: Diagnosis not present

## 2020-03-10 DIAGNOSIS — Z7982 Long term (current) use of aspirin: Secondary | ICD-10-CM | POA: Diagnosis not present

## 2020-03-10 DIAGNOSIS — I2510 Atherosclerotic heart disease of native coronary artery without angina pectoris: Secondary | ICD-10-CM | POA: Diagnosis not present

## 2020-03-10 DIAGNOSIS — I10 Essential (primary) hypertension: Secondary | ICD-10-CM | POA: Diagnosis not present

## 2020-03-10 DIAGNOSIS — K219 Gastro-esophageal reflux disease without esophagitis: Secondary | ICD-10-CM | POA: Diagnosis not present

## 2020-03-10 DIAGNOSIS — Z79899 Other long term (current) drug therapy: Secondary | ICD-10-CM | POA: Diagnosis not present

## 2020-03-10 DIAGNOSIS — R0789 Other chest pain: Secondary | ICD-10-CM | POA: Diagnosis not present

## 2020-03-12 DIAGNOSIS — R0602 Shortness of breath: Secondary | ICD-10-CM | POA: Diagnosis not present

## 2020-03-12 DIAGNOSIS — R079 Chest pain, unspecified: Secondary | ICD-10-CM | POA: Diagnosis not present

## 2020-03-13 DIAGNOSIS — I10 Essential (primary) hypertension: Secondary | ICD-10-CM | POA: Diagnosis not present

## 2020-03-13 DIAGNOSIS — E782 Mixed hyperlipidemia: Secondary | ICD-10-CM | POA: Diagnosis not present

## 2020-03-13 DIAGNOSIS — I2510 Atherosclerotic heart disease of native coronary artery without angina pectoris: Secondary | ICD-10-CM | POA: Diagnosis not present

## 2020-03-13 DIAGNOSIS — F411 Generalized anxiety disorder: Secondary | ICD-10-CM | POA: Diagnosis not present

## 2020-03-13 DIAGNOSIS — F410 Panic disorder [episodic paroxysmal anxiety] without agoraphobia: Secondary | ICD-10-CM | POA: Diagnosis not present

## 2020-03-13 DIAGNOSIS — F3341 Major depressive disorder, recurrent, in partial remission: Secondary | ICD-10-CM | POA: Diagnosis not present

## 2020-03-13 DIAGNOSIS — Z79899 Other long term (current) drug therapy: Secondary | ICD-10-CM | POA: Diagnosis not present

## 2020-04-09 ENCOUNTER — Telehealth: Payer: Self-pay | Admitting: Family Medicine

## 2020-04-09 NOTE — Telephone Encounter (Signed)
Left message to return call 

## 2020-04-09 NOTE — Telephone Encounter (Signed)
Pt's wife is calling asking if Dr. Brett Canales can prescribe Ambien or something to help pt sleep at night.   [facility] - [place] - [address]

## 2020-04-09 NOTE — Telephone Encounter (Signed)
With the multiple meds pt is already on by his m h provider that can cause sedation, he needs to spk with them re any addtnl meds for insomnia, we cannot prescribe

## 2020-04-09 NOTE — Telephone Encounter (Signed)
Wife (DPR) notified and will contact patient's mental health specialist for recommendations.

## 2020-06-17 DIAGNOSIS — F410 Panic disorder [episodic paroxysmal anxiety] without agoraphobia: Secondary | ICD-10-CM | POA: Diagnosis not present

## 2020-06-17 DIAGNOSIS — F3341 Major depressive disorder, recurrent, in partial remission: Secondary | ICD-10-CM | POA: Diagnosis not present

## 2020-06-17 DIAGNOSIS — F411 Generalized anxiety disorder: Secondary | ICD-10-CM | POA: Diagnosis not present

## 2020-06-17 DIAGNOSIS — F3342 Major depressive disorder, recurrent, in full remission: Secondary | ICD-10-CM | POA: Diagnosis not present

## 2020-07-04 ENCOUNTER — Telehealth: Payer: Self-pay | Admitting: Family Medicine

## 2020-07-04 DIAGNOSIS — I10 Essential (primary) hypertension: Secondary | ICD-10-CM

## 2020-07-04 DIAGNOSIS — Z79899 Other long term (current) drug therapy: Secondary | ICD-10-CM

## 2020-07-04 DIAGNOSIS — E785 Hyperlipidemia, unspecified: Secondary | ICD-10-CM

## 2020-07-04 DIAGNOSIS — Z0000 Encounter for general adult medical examination without abnormal findings: Secondary | ICD-10-CM

## 2020-07-04 NOTE — Telephone Encounter (Signed)
Pt has follow up on 8/11 and would like lab work done.

## 2020-07-04 NOTE — Telephone Encounter (Signed)
Last labs completed on 01/31/20 PSA, BMET, HEPATIC and LIPID. Please advise. Thank you

## 2020-07-07 NOTE — Telephone Encounter (Signed)
Lab orders placed and pt made aware.

## 2020-07-07 NOTE — Telephone Encounter (Signed)
Pls order, cbc, cmp, lipids, thx. Dr. Ladona Ridgel

## 2020-07-22 DIAGNOSIS — Z0000 Encounter for general adult medical examination without abnormal findings: Secondary | ICD-10-CM | POA: Diagnosis not present

## 2020-07-22 DIAGNOSIS — I10 Essential (primary) hypertension: Secondary | ICD-10-CM | POA: Diagnosis not present

## 2020-07-22 DIAGNOSIS — E785 Hyperlipidemia, unspecified: Secondary | ICD-10-CM | POA: Diagnosis not present

## 2020-07-22 DIAGNOSIS — Z79899 Other long term (current) drug therapy: Secondary | ICD-10-CM | POA: Diagnosis not present

## 2020-07-23 LAB — LIPID PANEL
Chol/HDL Ratio: 4.7 ratio (ref 0.0–5.0)
Cholesterol, Total: 154 mg/dL (ref 100–199)
HDL: 33 mg/dL — ABNORMAL LOW (ref >39)
LDL Chol Calc (NIH): 78 mg/dL (ref 0–99)
Triglycerides: 258 mg/dL — ABNORMAL HIGH (ref 0–149)
VLDL Cholesterol Cal: 43 mg/dL — ABNORMAL HIGH (ref 5–40)

## 2020-07-23 LAB — CBC WITH DIFFERENTIAL/PLATELET
Basophils Absolute: 0.1 10*3/uL (ref 0.0–0.2)
Basos: 1 %
EOS (ABSOLUTE): 0.3 10*3/uL (ref 0.0–0.4)
Eos: 4 %
Hematocrit: 39.8 % (ref 37.5–51.0)
Hemoglobin: 13.5 g/dL (ref 13.0–17.7)
Immature Grans (Abs): 0 10*3/uL (ref 0.0–0.1)
Immature Granulocytes: 0 %
Lymphocytes Absolute: 2.3 10*3/uL (ref 0.7–3.1)
Lymphs: 35 %
MCH: 29.8 pg (ref 26.6–33.0)
MCHC: 33.9 g/dL (ref 31.5–35.7)
MCV: 88 fL (ref 79–97)
Monocytes Absolute: 0.7 10*3/uL (ref 0.1–0.9)
Monocytes: 11 %
Neutrophils Absolute: 3.2 10*3/uL (ref 1.4–7.0)
Neutrophils: 49 %
Platelets: 191 10*3/uL (ref 150–450)
RBC: 4.53 x10E6/uL (ref 4.14–5.80)
RDW: 14 % (ref 11.6–15.4)
WBC: 6.7 10*3/uL (ref 3.4–10.8)

## 2020-07-23 LAB — COMPREHENSIVE METABOLIC PANEL
ALT: 26 IU/L (ref 0–44)
AST: 23 IU/L (ref 0–40)
Albumin/Globulin Ratio: 1.3 (ref 1.2–2.2)
Albumin: 4.3 g/dL (ref 3.7–4.7)
Alkaline Phosphatase: 83 IU/L (ref 48–121)
BUN/Creatinine Ratio: 10 (ref 10–24)
BUN: 14 mg/dL (ref 8–27)
Bilirubin Total: <0.2 mg/dL (ref 0.0–1.2)
CO2: 21 mmol/L (ref 20–29)
Calcium: 9.2 mg/dL (ref 8.6–10.2)
Chloride: 108 mmol/L — ABNORMAL HIGH (ref 96–106)
Creatinine, Ser: 1.42 mg/dL — ABNORMAL HIGH (ref 0.76–1.27)
GFR calc Af Amer: 57 mL/min/{1.73_m2} — ABNORMAL LOW (ref >59)
GFR calc non Af Amer: 49 mL/min/{1.73_m2} — ABNORMAL LOW (ref >59)
Globulin, Total: 3.4 g/dL (ref 1.5–4.5)
Glucose: 110 mg/dL — ABNORMAL HIGH (ref 65–99)
Potassium: 4.8 mmol/L (ref 3.5–5.2)
Sodium: 141 mmol/L (ref 134–144)
Total Protein: 7.7 g/dL (ref 6.0–8.5)

## 2020-08-06 ENCOUNTER — Other Ambulatory Visit: Payer: Self-pay | Admitting: *Deleted

## 2020-08-06 ENCOUNTER — Ambulatory Visit: Payer: Medicare Other | Admitting: Family Medicine

## 2020-08-06 ENCOUNTER — Telehealth: Payer: Self-pay | Admitting: Family Medicine

## 2020-08-06 MED ORDER — ROSUVASTATIN CALCIUM 40 MG PO TABS: 40.0000 mg | ORAL_TABLET | Freq: Every day | ORAL | 0 refills | Status: DC

## 2020-08-06 NOTE — Telephone Encounter (Signed)
Pt needs refill on rosuvastatin (CRESTOR) 40 MG tablet  [facility] - [place], [place] - [address]

## 2020-08-22 DIAGNOSIS — E782 Mixed hyperlipidemia: Secondary | ICD-10-CM | POA: Diagnosis not present

## 2020-08-22 DIAGNOSIS — I10 Essential (primary) hypertension: Secondary | ICD-10-CM | POA: Diagnosis not present

## 2020-08-22 DIAGNOSIS — I2510 Atherosclerotic heart disease of native coronary artery without angina pectoris: Secondary | ICD-10-CM | POA: Diagnosis not present

## 2020-08-27 DIAGNOSIS — F339 Major depressive disorder, recurrent, unspecified: Secondary | ICD-10-CM | POA: Diagnosis not present

## 2020-08-27 DIAGNOSIS — F5101 Primary insomnia: Secondary | ICD-10-CM | POA: Diagnosis not present

## 2020-08-27 DIAGNOSIS — F411 Generalized anxiety disorder: Secondary | ICD-10-CM | POA: Diagnosis not present

## 2020-08-28 ENCOUNTER — Other Ambulatory Visit: Payer: Self-pay

## 2020-08-28 ENCOUNTER — Encounter: Payer: Self-pay | Admitting: Family Medicine

## 2020-08-28 ENCOUNTER — Ambulatory Visit (INDEPENDENT_AMBULATORY_CARE_PROVIDER_SITE_OTHER): Payer: Medicare Other | Admitting: Family Medicine

## 2020-08-28 VITALS — BP 132/78 | HR 80 | Temp 97.8°F | Ht 68.0 in | Wt 226.0 lb

## 2020-08-28 DIAGNOSIS — N183 Chronic kidney disease, stage 3 unspecified: Secondary | ICD-10-CM

## 2020-08-28 DIAGNOSIS — R7301 Impaired fasting glucose: Secondary | ICD-10-CM | POA: Diagnosis not present

## 2020-08-28 DIAGNOSIS — F1320 Sedative, hypnotic or anxiolytic dependence, uncomplicated: Secondary | ICD-10-CM | POA: Insufficient documentation

## 2020-08-28 DIAGNOSIS — E7800 Pure hypercholesterolemia, unspecified: Secondary | ICD-10-CM

## 2020-08-28 DIAGNOSIS — F410 Panic disorder [episodic paroxysmal anxiety] without agoraphobia: Secondary | ICD-10-CM | POA: Insufficient documentation

## 2020-08-28 DIAGNOSIS — I10 Essential (primary) hypertension: Secondary | ICD-10-CM | POA: Diagnosis not present

## 2020-08-28 DIAGNOSIS — F329 Major depressive disorder, single episode, unspecified: Secondary | ICD-10-CM | POA: Insufficient documentation

## 2020-08-28 MED ORDER — SILDENAFIL CITRATE 20 MG PO TABS: ORAL_TABLET | ORAL | 5 refills | Status: DC

## 2020-08-28 MED ORDER — ROSUVASTATIN CALCIUM 40 MG PO TABS: 40.0000 mg | ORAL_TABLET | Freq: Every day | ORAL | 1 refills | Status: DC

## 2020-08-28 MED ORDER — LORAZEPAM 0.5 MG PO TABS: ORAL_TABLET | ORAL | Status: DC

## 2020-08-28 NOTE — Progress Notes (Signed)
Patient ID: Devin Daugherty, male    DOB: August 20, 1951, 69 y.o.   MRN: [id number]   Chief Complaint  Patient presents with  . Hyperlipidemia   Subjective:    HPI  CC- HLD med check up. Pt states no concerns. Needs refills on all meds.  Pt needing refill on crestor and sildenafil.  Pt seeing psychiatry for zoloft, ativan, and remeron.  HLD- doing well no new concerns.  Compliant with meds. No chest pain, palpitations, myalgias or joint pains.  Pt also seeing Cardiology, pt stating getting his BP medications from them.  Eating ice cream at night. Has had increasing in weight in past year.  In 8/20- 205 lb and now at 226 lbs.  Since covid has closed gyms. Has been working in shop and garden.   Medical History [place] has a past medical history of Anxiety, CAD (coronary artery disease), High cholesterol, and Hypertension.   Outpatient Encounter Medications as of 08/28/2020  Medication Sig  . gabapentin (NEURONTIN) 300 MG capsule Take by mouth daily.   Marland Kitchen LORazepam (ATIVAN) 0.5 MG tablet One by mouth up to three times daily for anxiety.  . metoprolol succinate (TOPROL-XL) 25 MG 24 hr tablet Take by mouth.  . mirtazapine (REMERON) 30 MG tablet Take 30 mg by mouth at bedtime.   . rosuvastatin (CRESTOR) 40 MG tablet Take 1 tablet (40 mg total) by mouth daily.  . sertraline (ZOLOFT) 100 MG tablet Take 100 mg by mouth daily. Take 2 tablets  . [DISCONTINUED] rosuvastatin (CRESTOR) 40 MG tablet Take 1 tablet (40 mg total) by mouth daily.  . sildenafil (REVATIO) 20 MG tablet Take 2-3 tablets po 1-2 hrs before sex  . [DISCONTINUED] sildenafil (REVATIO) 20 MG tablet Take 2-3 tablets po 1-2 hrs before sex   No facility-administered encounter medications on file as of 08/28/2020.     Review of Systems  Constitutional: Negative for chills and fever.  HENT: Negative for congestion, rhinorrhea and sore throat.   Respiratory: Negative for cough, shortness of breath and wheezing.     Cardiovascular: Negative for chest pain and leg swelling.  Gastrointestinal: Negative for abdominal pain, diarrhea, nausea and vomiting.  Genitourinary: Negative for dysuria and frequency.  Skin: Negative for rash.  Neurological: Negative for dizziness, weakness and headaches.     Vitals BP 132/78   Pulse 80   Temp 97.8 F (36.6 C)   Ht 5\' 8"  (1.727 m)   Wt 226 lb (102.5 kg)   SpO2 97%   BMI 34.36 kg/m   Objective:   Physical Exam Vitals and nursing note reviewed.  Constitutional:      General: He is not in acute distress.    Appearance: Normal appearance. He is not ill-appearing.  HENT:     Head: Normocephalic.     Nose: Nose normal. No congestion.     Mouth/Throat:     Mouth: Mucous membranes are moist.     Pharynx: No oropharyngeal exudate.  Eyes:     Extraocular Movements: Extraocular movements intact.     Conjunctiva/sclera: Conjunctivae normal.     Pupils: Pupils are equal, round, and reactive to light.  Cardiovascular:     Rate and Rhythm: Normal rate and regular rhythm.     Pulses: Normal pulses.     Heart sounds: Normal heart sounds. No murmur heard.   Pulmonary:     Effort: Pulmonary effort is normal.     Breath sounds: Normal breath sounds. No wheezing, rhonchi or rales.  Musculoskeletal:  General: Normal range of motion.     Right lower leg: No edema.     Left lower leg: No edema.  Skin:    General: Skin is warm and dry.     Findings: No rash.  Neurological:     General: No focal deficit present.     Mental Status: He is alert and oriented to person, place, and time.     Cranial Nerves: No cranial nerve deficit.  Psychiatric:        Mood and Affect: Mood normal.        Behavior: Behavior normal.        Thought Content: Thought content normal.        Judgment: Judgment normal.      Assessment and Plan   1. Essential hypertension  2. Hypercholesterolemia  3. Impaired fasting blood sugar  4. Stage 3 chronic kidney disease,  unspecified whether stage 3a or 3b CKD   HLD- stable, triglycerides inc from 185 to 258 in past 6 months.  cont meds.  rec dec carb intake. Needs refill on crestor rest of his meds are from other providers.  CKD- On lisinopril also, seeing Dr. Hulen Shouts. ckd stage 3, cr 1.42, improved compared to last labs showing Cr 1.6.  htn- stable, on lisnipril 40mg  daily and metoprolol.   Depression-MDD/anxiety/insomnia- Taking zoloft 200mg , started new doctor yesterday, located in [place], seeing psychiatry.   Pt also on ambien 5mg  and Risperdal per psychiatry.  Pt taking ativan 0.5mg  tid prn, has been on this for years.  Impaired fasting bg- cont to monitor and dec carb intake.  F/u 58mo or prn.

## 2020-09-05 ENCOUNTER — Encounter: Payer: Self-pay | Admitting: Family Medicine

## 2020-11-25 DIAGNOSIS — F339 Major depressive disorder, recurrent, unspecified: Secondary | ICD-10-CM | POA: Diagnosis not present

## 2020-11-25 DIAGNOSIS — F5101 Primary insomnia: Secondary | ICD-10-CM | POA: Diagnosis not present

## 2020-11-25 DIAGNOSIS — F411 Generalized anxiety disorder: Secondary | ICD-10-CM | POA: Diagnosis not present

## 2021-02-23 DIAGNOSIS — F411 Generalized anxiety disorder: Secondary | ICD-10-CM | POA: Diagnosis not present

## 2021-02-23 DIAGNOSIS — F5101 Primary insomnia: Secondary | ICD-10-CM | POA: Diagnosis not present

## 2021-02-23 DIAGNOSIS — F339 Major depressive disorder, recurrent, unspecified: Secondary | ICD-10-CM | POA: Diagnosis not present

## 2021-03-26 DIAGNOSIS — F411 Generalized anxiety disorder: Secondary | ICD-10-CM | POA: Diagnosis not present

## 2021-03-26 DIAGNOSIS — F339 Major depressive disorder, recurrent, unspecified: Secondary | ICD-10-CM | POA: Diagnosis not present

## 2021-03-26 DIAGNOSIS — F5101 Primary insomnia: Secondary | ICD-10-CM | POA: Diagnosis not present

## 2021-04-07 ENCOUNTER — Ambulatory Visit (INDEPENDENT_AMBULATORY_CARE_PROVIDER_SITE_OTHER): Payer: Medicare Other | Admitting: Behavioral Health

## 2021-04-07 ENCOUNTER — Other Ambulatory Visit: Payer: Self-pay

## 2021-04-07 ENCOUNTER — Encounter: Payer: Self-pay | Admitting: Behavioral Health

## 2021-04-07 VITALS — BP 177/115 | HR 76 | Ht 68.0 in | Wt 224.0 lb

## 2021-04-07 DIAGNOSIS — F411 Generalized anxiety disorder: Secondary | ICD-10-CM

## 2021-04-07 DIAGNOSIS — F331 Major depressive disorder, recurrent, moderate: Secondary | ICD-10-CM | POA: Diagnosis not present

## 2021-04-07 DIAGNOSIS — F1320 Sedative, hypnotic or anxiolytic dependence, uncomplicated: Secondary | ICD-10-CM

## 2021-04-07 DIAGNOSIS — G2401 Drug induced subacute dyskinesia: Secondary | ICD-10-CM | POA: Diagnosis not present

## 2021-04-07 DIAGNOSIS — G2111 Neuroleptic induced parkinsonism: Secondary | ICD-10-CM

## 2021-04-07 MED ORDER — VALBENAZINE TOSYLATE 80 MG PO CAPS: 80.0000 mg | ORAL_CAPSULE | Freq: Every day | ORAL | 0 refills | Status: DC

## 2021-04-07 NOTE — Progress Notes (Signed)
[facility] MD/PA/NP Initial Note  04/07/2021 3:40 PM Devin Daugherty  MRN:  [id number]  Chief Complaint:  Chief Complaint    Anxiety; Establish Care      HPI:70 year old male presents to this office with his spouse present to establish new client psychiatric care. He says "I have had trouble with anxiety and depression my entire life. It is mostly bad anxiety and seems like the depression is almost not there". He says that he has been hospitalized multiple times when the anxiety has got so bad that he feels like he is having breakdown. He says his anxiety is 5 on 10 scale and his depression is 3. Says last visit to hospital he spent about 4 days inpatient treatment. He does not endorse mania or mood problems. Says that he sleeps 7-8 hours and has occasional nightmares. He says that he has tried just about every medication there is. Says it finally got bad enough that he has undergone at least 11 ECT treatments and finally stopped because he was not experiencing improvement. Pt says that he does have tremors in hands and has abnormal movement with his tongue and muscles in face. Says that it is hard for him to sign his name. Pt says that he receives some spiritual counseling from minister at church but not currently participating in any psychotherapy. Overall, he says he likes his current medication regimen because it has kept him functioning and out of the hospital. He says that he does not want to make any changes at this time but would consider medication for his shaking.   Past psychiatric medication failures: patient to bring list next visit.     Visit Diagnosis:    ICD-10-CM   1. Generalized anxiety disorder  F41.1   2. Major depressive disorder, recurrent episode, moderate (HCC)  F33.1   3. Tardive dyskinesia  G24.01 valbenazine (INGREZZA) 80 MG capsule  4. Neuroleptic-induced parkinsonism (HCC) Chronic G21.11   5. Benzodiazepine dependence (HCC) Chronic F13.20     Past Psychiatric  History: Report multiple prior hospitalizations and lengthy hx for severe debilitating anxiety.   Past Medical History:  Past Medical History:  Diagnosis Date  . Anxiety   . CAD (coronary artery disease)   . Depression   . High cholesterol   . Hypertension     Past Surgical History:  Procedure Laterality Date  . COLONOSCOPY    . CORONARY ANGIOPLASTY WITH STENT PLACEMENT  05/2018  . Heart  Stent  2009  . HERNIA REPAIR      Family Psychiatric History:   Family History:  Family History  Problem Relation Age of Onset  . Heart attack Father   . Hyperlipidemia Son     Social History:  Social History   Socioeconomic History  . Marital status: Married    Spouse name: Not on file  . Number of children: 2  . Years of education: 64  . Highest education level: Associate degree: occupational, Scientist, product/process development, or vocational program  Occupational History  . Occupation: retired    Associate Professor: [employer]    Comment: welder  Tobacco Use  . Smoking status: Never Smoker  . Smokeless tobacco: Never Used  Substance and Sexual Activity  . Alcohol use: No  . Drug use: No  . Sexual activity: Yes    Partners: Female    Birth control/protection: None    Comment: married  Other Topics Concern  . Not on file  Social History Narrative   Married, lives at home with spouse.  Multiple children and grandchildren. 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 Active in local church. Likes to travel with spouse to the beach.    Social Determinants of Health   Financial Resource Strain: Not on file  Food Insecurity: Not on file  Transportation Needs: Not on file  Physical Activity: Not on file  Stress: Not on file  Social Connections: Not on file    Allergies:  Allergies  Allergen Reactions  . Codeine Itching    Metabolic Disorder Labs: Lab Results  Component Value Date   HGBA1C 5.8 12/13/2014   No results found for: PROLACTIN Lab Results  Component Value Date   CHOL 154  07/22/2020   TRIG 258 (H) 07/22/2020   HDL 33 (L) 07/22/2020   CHOLHDL 4.7 07/22/2020   VLDL 37 12/06/2014   LDLCALC 78 07/22/2020   LDLCALC 81 01/31/2020   Lab Results  Component Value Date   TSH 1.184 12/06/2014    Therapeutic Level Labs: No results found for: LITHIUM No results found for: VALPROATE No components found for:  CBMZ  Current Medications: Current Outpatient Medications  Medication Sig Dispense Refill  . AMBIEN 5 MG tablet Take 5 mg by mouth at bedtime as needed.    . gabapentin (NEURONTIN) 300 MG capsule Take by mouth daily.     Marland Kitchen lisinopril (ZESTRIL) 40 MG tablet Take by mouth.    Marland Kitchen LORazepam (ATIVAN) 0.5 MG tablet One by mouth up to three times daily for anxiety.- per psychiatrist 30 tablet   . metoprolol succinate (TOPROL-XL) 25 MG 24 hr tablet Take by mouth.    . mirtazapine (REMERON) 30 MG tablet Take 30 mg by mouth at bedtime.     Marland Kitchen RISPERDAL 0.5 MG tablet Take 0.5 mg by mouth 2 (two) times daily.    . rosuvastatin (CRESTOR) 40 MG tablet Take 1 tablet (40 mg total) by mouth daily. 90 tablet 1  . sertraline (ZOLOFT) 100 MG tablet Take 100 mg by mouth daily. Take 2 tablets    . sildenafil (REVATIO) 20 MG tablet Take 2-3 tablets po 1-2 hrs before sex 24 tablet 5  . valbenazine (INGREZZA) 80 MG capsule Take 1 capsule (80 mg total) by mouth daily. 30 capsule 0   No current facility-administered medications for this visit.    Medication Side Effects: none  Orders placed this visit:  No orders of the defined types were placed in this encounter.   Psychiatric Specialty Exam:  Review of Systems  Blood pressure (!) 177/115, pulse 76, height 5\' 8"  (1.727 m), weight 224 lb (101.6 kg).Body mass index is 34.06 kg/m.  General Appearance: Casual, Neat and Well Groomed  Eye Contact:  Good  Speech:  Clear and Coherent  Volume:  Normal  Mood:  Anxious  Affect:  Appropriate  Thought Process:  Coherent  Orientation:  Full (Time, Place, and Person)  Thought  Content: Logical   Suicidal Thoughts:  No  Homicidal Thoughts:  No  Memory:  WNL  Judgement:  Good  Insight:  Good  Psychomotor Activity:  Normal  Concentration:  Concentration: Good  Recall:  Good  Fund of Knowledge: Good  Language: Good  Assets:  Desire for Improvement  ADL's:  Intact  Cognition: WNL  Prognosis:  Good   Screenings:  AIMS   Flowsheet Row Office Visit from 04/07/2021 in [facility]  AIMS Total Score 20    PHQ2-9   Flowsheet Row Office Visit from 08/28/2020 in [facility] Office Visit  from 01/09/2019 in [facility] Office Visit from 07/04/2017 in [facility]  PHQ-2 Total Score 1 6 2   PHQ-9 Total Score 4 17 4       Receiving Psychotherapy: No   Treatment Plan/Recommendations:  Patient desired to remain on his current medication regimen. Will begin Ingrezza 80 mg. Will start on 40 mg capsule for one week and then increase to 80 mg second week. Will follow up in 4 weeks to reassess symptoms of TD. Will report any severe side effects promptly.      , NP

## 2021-04-08 ENCOUNTER — Ambulatory Visit: Payer: Medicare Other | Admitting: Dermatology

## 2021-04-17 ENCOUNTER — Telehealth: Payer: Self-pay | Admitting: Behavioral Health

## 2021-04-17 NOTE — Telephone Encounter (Signed)
Pt got samples of Ingrezza at last visit and is now taking Ingrezza 40mg .  It has helped the tremors and they like it. Will need to get an RX. Is there a program to help with cost? Very expensive and [facility] doesn't carry it. He has samples for a few days left.

## 2021-04-17 NOTE — Telephone Encounter (Signed)
Rtc to wife and explained that Allena Earing is a speciality medication and we typically use Corporate treasurer. Informed her I would update [place] on Monday and fax all his information so Marcelino Duster can check cost etc. Pt is doing well on 40 mg Ingrezza daily. Will send in Rx on Monday as well.

## 2021-04-20 ENCOUNTER — Other Ambulatory Visit: Payer: Self-pay

## 2021-04-20 MED ORDER — VALBENAZINE TOSYLATE 40 MG PO CAPS: 40.0000 mg | ORAL_CAPSULE | Freq: Every day | ORAL | 1 refills | Status: DC

## 2021-04-20 NOTE — Telephone Encounter (Signed)
Marcelino Duster at [facility] has been contacted, she faxed several forms for Devin Daugherty to sign. Those have been signed and faxed back. Will initiate PA and she has all the information.

## 2021-05-05 ENCOUNTER — Ambulatory Visit: Payer: Medicare Other | Admitting: Behavioral Health

## 2021-05-11 ENCOUNTER — Other Ambulatory Visit: Payer: Self-pay

## 2021-05-11 ENCOUNTER — Ambulatory Visit (INDEPENDENT_AMBULATORY_CARE_PROVIDER_SITE_OTHER): Payer: Medicare Other | Admitting: Behavioral Health

## 2021-05-11 ENCOUNTER — Encounter: Payer: Self-pay | Admitting: Behavioral Health

## 2021-05-11 DIAGNOSIS — G4700 Insomnia, unspecified: Secondary | ICD-10-CM | POA: Diagnosis not present

## 2021-05-11 DIAGNOSIS — F331 Major depressive disorder, recurrent, moderate: Secondary | ICD-10-CM

## 2021-05-11 DIAGNOSIS — F410 Panic disorder [episodic paroxysmal anxiety] without agoraphobia: Secondary | ICD-10-CM | POA: Diagnosis not present

## 2021-05-11 DIAGNOSIS — F411 Generalized anxiety disorder: Secondary | ICD-10-CM

## 2021-05-11 MED ORDER — RISPERDAL 0.5 MG PO TABS: 0.5000 mg | ORAL_TABLET | Freq: Two times a day (BID) | ORAL | 2 refills | Status: DC

## 2021-05-11 MED ORDER — LORAZEPAM 0.5 MG PO TABS: ORAL_TABLET | ORAL | 2 refills | Status: DC

## 2021-05-11 MED ORDER — ZOLPIDEM TARTRATE 5 MG PO TABS: 5.0000 mg | ORAL_TABLET | Freq: Every evening | ORAL | 2 refills | Status: DC | PRN

## 2021-05-11 MED ORDER — MIRTAZAPINE 30 MG PO TABS: 30.0000 mg | ORAL_TABLET | Freq: Every day | ORAL | 0 refills | Status: DC

## 2021-05-11 MED ORDER — GABAPENTIN 400 MG PO CAPS: 400.0000 mg | ORAL_CAPSULE | Freq: Three times a day (TID) | ORAL | 2 refills | Status: DC

## 2021-05-11 NOTE — Progress Notes (Addendum)
Devin Daugherty [id number] 1951-11-26 70 y.o.  Subjective:   Patient ID:  Devin Daugherty is a 70 y.o. (DOB 1951-11-03) male.  Chief Complaint:  Chief Complaint  Patient presents with  . Anxiety  . Depression  . Medication Refill  . Follow-up    HPI  04/07/2021 70 year old male presents to this office with his spouse present to establish new client psychiatric care. He says "I have had trouble with anxiety and depression my entire life. It is mostly bad anxiety and seems like the depression is almost not there". He says that he has been hospitalized multiple times when the anxiety has got so bad that he feels like he is having breakdown. He says his anxiety is 5 on 10 scale and his depression is 3. Says last visit to hospital he spent about 4 days inpatient treatment. He does not endorse mania or mood problems. Says that he sleeps 7-8 hours and has occasional nightmares. He says that he has tried just about every medication there is. Says it finally got bad enough that he has undergone at least 11 ECT treatments and finally stopped because he was not experiencing improvement. Pt says that he does have tremors in hands and has abnormal movement with his tongue and muscles in face. Says that it is hard for him to sign his name. Pt says that he receives some spiritual counseling from minister at church but not currently participating in any psychotherapy. Overall, he says he likes his current medication regimen because it has kept him functioning and out of the hospital. He says that he does not want to make any changes at this time but would consider medication for his shaking.   Past psychiatric medication failures: patient did not bring med list of prior med failures and could not remember except for: Prozac Paxil   05/11/2021 HPI: Patient presented to this office for follow up and medication management with spouse present with consent. Patient says, " I could not take the medication you  gave me, it made me feel to sluggish". Patient says he could not take the Ingrezza 40 mg tablet and said he quit taking it after one week. Spouse says that he typically has not given drugs like his antidepressants much time to work and has quit some of the meds after a few days if he does not like the way it makes him feel. He says that he wants to remain on his current medication regimen right now and not make any changes. He says that he normally takes his gabapentin and ativan for panic attacks or extreme anxiety and it works for him. Says that he has struggle with anxiety for over 30 years. Spouse says that he will get severe anxiety for just being at their grandsons baseball games. Says that he never takes more than his script for Ativan. Says he would like to take even less of the Ativan if he can get a little more control over his anxiety. He reports anxiety today at 4 and depression at 3. Last visit anxiety was 5. He says he sleeps 6-7 hours per night. No mania, no psychosis, no auditory or visual hallucinations. No SI, or HI.   Past psychiatric medication failures: patient did not bring med list of prior med failures and could not remember except for: Prozac Paxil     Review of Systems:  Review of Systems  Constitutional: Negative.   Allergic/Immunologic: Negative.   Neurological: Positive for tremors.  Psychiatric/Behavioral: The patient  is nervous/anxious.     Medications:   Current Outpatient Medications  Medication Sig Dispense Refill  . gabapentin (NEURONTIN) 400 MG capsule Take 1 capsule (400 mg total) by mouth 3 (three) times daily. 90 capsule 2  . zolpidem (AMBIEN) 5 MG tablet Take 1 tablet (5 mg total) by mouth at bedtime as needed for sleep. 30 tablet 2  . lisinopril (ZESTRIL) 40 MG tablet Take by mouth.    Marland Kitchen LORazepam (ATIVAN) 0.5 MG tablet One by mouth up to three times daily for anxiety.- per psychiatrist 90 tablet 2  . metoprolol succinate (TOPROL-XL) 25 MG 24 hr tablet  Take by mouth.    . mirtazapine (REMERON) 30 MG tablet Take 1 tablet (30 mg total) by mouth at bedtime. 90 tablet 0  . RISPERDAL 0.5 MG tablet Take 1 tablet (0.5 mg total) by mouth 2 (two) times daily. 60 tablet 2  . rosuvastatin (CRESTOR) 40 MG tablet Take 1 tablet (40 mg total) by mouth daily. 90 tablet 1  . sertraline (ZOLOFT) 100 MG tablet Take 100 mg by mouth daily. Take 2 tablets    . sildenafil (REVATIO) 20 MG tablet Take 2-3 tablets po 1-2 hrs before sex 24 tablet 5   No current facility-administered medications for this visit.    Medication Side Effects:none  Allergies:  Allergies  Allergen Reactions  . Codeine Itching    Past Medical History:  Diagnosis Date  . Anxiety   . CAD (coronary artery disease)   . Depression   . High cholesterol   . Hypertension      Past Medical History, Surgical history, Social history, and Family history were reviewed and updated as appropriate.   Please see review of systems for further details on the patient's review from today.   Objective:   Physical Exam:  There were no vitals taken for this visit.  Physical Exam Constitutional:      General: He is not in acute distress.    Appearance: Normal appearance.  Neurological:     Mental Status: He is alert and oriented to person, place, and time.     Gait: Gait normal.  Psychiatric:        Attention and Perception: Attention and perception normal. He does not perceive auditory or visual hallucinations.        Mood and Affect: Mood and affect normal. Mood is not anxious or depressed. Affect is not labile.        Speech: Speech normal.        Behavior: Behavior normal. Behavior is cooperative.        Thought Content: Thought content normal.        Cognition and Memory: Cognition and memory normal.        Judgment: Judgment normal.     Lab Review:     Component Value Date/Time   NA 141 07/22/2020 0928   K 4.8 07/22/2020 0928   CL 108 (H) 07/22/2020 0928   CO2 21 07/22/2020  0928   GLUCOSE 110 (H) 07/22/2020 0928   GLUCOSE 90 08/22/2019 1418   BUN 14 07/22/2020 0928   CREATININE 1.42 (H) 07/22/2020 0928   CREATININE 1.15 12/06/2014 0839   CALCIUM 9.2 07/22/2020 0928   PROT 7.7 07/22/2020 0928   ALBUMIN 4.3 07/22/2020 0928   AST 23 07/22/2020 0928   ALT 26 07/22/2020 0928   ALKPHOS 83 07/22/2020 0928   BILITOT <0.2 07/22/2020 0928   GFRNONAA 49 (L) 07/22/2020 0928   GFRAA 57 (L) 07/22/2020  1610       Component Value Date/Time   WBC 6.7 07/22/2020 0928   WBC 7.1 08/22/2019 1418   RBC 4.53 07/22/2020 0928   RBC 4.62 08/22/2019 1418   HGB 13.5 07/22/2020 0928   HCT 39.8 07/22/2020 0928   PLT 191 07/22/2020 0928   MCV 88 07/22/2020 0928   MCH 29.8 07/22/2020 0928   MCH 29.7 08/22/2019 1418   MCHC 33.9 07/22/2020 0928   MCHC 34.9 08/22/2019 1418   RDW 14.0 07/22/2020 0928   LYMPHSABS 2.3 07/22/2020 0928   MONOABS 0.9 08/22/2019 1418   EOSABS 0.3 07/22/2020 0928   BASOSABS 0.1 07/22/2020 0928    No results found for: POCLITH, LITHIUM   No results found for: PHENYTOIN, PHENOBARB, VALPROATE, CBMZ   .res Assessment: Plan:    Perkins was seen today for anxiety, depression, medication refill and follow-up.  Diagnoses and all orders for this visit:  Generalized anxiety disorder -     gabapentin (NEURONTIN) 400 MG capsule; Take 1 capsule (400 mg total) by mouth 3 (three) times daily. -     LORazepam (ATIVAN) 0.5 MG tablet; One by mouth up to three times daily for anxiety.- per psychiatrist -     RISPERDAL 0.5 MG tablet; Take 1 tablet (0.5 mg total) by mouth 2 (two) times daily.  Major depressive disorder, recurrent episode, moderate (HCC) -     gabapentin (NEURONTIN) 400 MG capsule; Take 1 capsule (400 mg total) by mouth 3 (three) times daily. -     RISPERDAL 0.5 MG tablet; Take 1 tablet (0.5 mg total) by mouth 2 (two) times daily.  Panic disorder -     LORazepam (ATIVAN) 0.5 MG tablet; One by mouth up to three times daily for anxiety.- per  psychiatrist  Insomnia, unspecified type -     mirtazapine (REMERON) 30 MG tablet; Take 1 tablet (30 mg total) by mouth at bedtime. -     zolpidem (AMBIEN) 5 MG tablet; Take 1 tablet (5 mg total) by mouth at bedtime as needed for sleep.   Plan/Recommendations Continue on current medication regimen To increase Gabapentin to 400 mg 3 times daily Will report and side effects or worsening To follow up in two months to reassess anxiety  Greater than 50% of  30 min. face to face time with patient was spent on counseling and coordination of care. We discussed his symptoms of TD and him stopping administration of Ingrezza for fatigue.  Discussed with patient that these mild side effects may go away as body adjust to medication but he declined to continue. Discussed increasing dosage of Gabapentin to assist with anxiety and depression but wanted to see the patient decrease dosage and administration of Ativan if possible. Will follow up in 2 months to reassess progress.  Patient could not remember if his labs from 07/22/2020 were follow up with PCP. Discussed with him elevated creatinine and decreased GFR levels. His Lipid profile was also abnormal. Patient agreed to follow up with PCP promptly.   Please see After Visit Summary for patient specific instructions.  Future Appointments  Date Time Provider Department Center  07/13/2021 11:00 AM Avelina Laine A, NP [facility] None    No orders of the defined types were placed in this encounter.   -------------------------------

## 2021-05-12 ENCOUNTER — Telehealth: Payer: Self-pay | Admitting: Family Medicine

## 2021-05-12 NOTE — Telephone Encounter (Signed)
Pt contacted and verbalized understanding.  

## 2021-05-12 NOTE — Telephone Encounter (Signed)
Please advise. Thank you

## 2021-05-12 NOTE — Telephone Encounter (Signed)
Please contact patient to have him schedule follow up. Thank you 

## 2021-05-12 NOTE — Telephone Encounter (Signed)
Specialist is wanting patient to do follow up labs with primary care CBC,lipid profile and check thyroid,comprehensive metabolic panel and have a follow up visit here. Please advise

## 2021-05-12 NOTE — Telephone Encounter (Signed)
Yes, he can get them after the appt after we discuss the concerns. Thx. Dr. Karie Schwalbe

## 2021-05-12 NOTE — Telephone Encounter (Signed)
Scheduled appointment on 5/26 and wanting to get labs done that day after appointment keeping from making extra trip to [place] if possible

## 2021-05-20 ENCOUNTER — Telehealth: Payer: Self-pay | Admitting: Behavioral Health

## 2021-05-20 NOTE — Telephone Encounter (Signed)
Please review

## 2021-05-20 NOTE — Telephone Encounter (Signed)
Patient was contacted  and provided information about recommended follow up labs every 6 months unless otherwise directed by PCP. Pt could not remember reviewing labs from July 2021 showing some abnormal lab values.

## 2021-05-20 NOTE — Telephone Encounter (Signed)
Devin Daugherty, Devin Daugherty's wife called. She is listed on the DPR to speak to. [name] is seeing his PCP tomorrow and they would like to ask about the blood results. [name] had left a message for them but they missed a lot of it on the phone. Telephone number is [phone].

## 2021-05-21 ENCOUNTER — Ambulatory Visit (INDEPENDENT_AMBULATORY_CARE_PROVIDER_SITE_OTHER): Payer: Medicare Other | Admitting: Family Medicine

## 2021-05-21 ENCOUNTER — Other Ambulatory Visit: Payer: Self-pay

## 2021-05-21 VITALS — BP 138/92 | HR 74 | Temp 97.3°F | Ht 68.0 in | Wt 223.0 lb

## 2021-05-21 DIAGNOSIS — I10 Essential (primary) hypertension: Secondary | ICD-10-CM | POA: Diagnosis not present

## 2021-05-21 DIAGNOSIS — E7800 Pure hypercholesterolemia, unspecified: Secondary | ICD-10-CM

## 2021-05-21 DIAGNOSIS — N183 Chronic kidney disease, stage 3 unspecified: Secondary | ICD-10-CM | POA: Insufficient documentation

## 2021-05-21 NOTE — Progress Notes (Signed)
Patient ID: Devin Daugherty, male    DOB: [birth year], 70 y.o.   MRN: [id number]   Chief Complaint  Patient presents with   Hypertension   Hyperlipidemia    Labs request cbc, cmp, lipid, gfr   Subjective:    HPI  Checking be at home- 135/80-  HTN Pt compliant with BP meds.  No SEs Denies chest pain, sob, LE swelling, or blurry vision.  HTN Pt compliant with BP meds.  No SEs Denies chest pain, sob, LE swelling, or blurry vision.  Pt seeing psychiatry for medication for anxiety, depression and insomnia. Taking ambien, ativan, remeron, gabapentin, and risperdal.  Medical History Devin Daugherty has a past medical history of Anxiety, CAD (coronary artery disease), Depression, High cholesterol, and Hypertension.   Outpatient Encounter Medications as of 05/21/2021  Medication Sig   gabapentin (NEURONTIN) 400 MG capsule Take 1 capsule (400 mg total) by mouth 3 (three) times daily.   lisinopril (ZESTRIL) 40 MG tablet Take by mouth.   LORazepam (ATIVAN) 0.5 MG tablet One by mouth up to three times daily for anxiety.- per psychiatrist   metoprolol succinate (TOPROL-XL) 25 MG 24 hr tablet Take by mouth.   mirtazapine (REMERON) 30 MG tablet Take 1 tablet (30 mg total) by mouth at bedtime.   RISPERDAL 0.5 MG tablet Take 1 tablet (0.5 mg total) by mouth 2 (two) times daily.   rosuvastatin (CRESTOR) 40 MG tablet Take 1 tablet (40 mg total) by mouth daily.   sertraline (ZOLOFT) 100 MG tablet Take 100 mg by mouth daily. Take 2 tablets   sildenafil (REVATIO) 20 MG tablet Take 2-3 tablets po 1-2 hrs before sex   zolpidem (AMBIEN) 5 MG tablet Take 1 tablet (5 mg total) by mouth at bedtime as needed for sleep.   No facility-administered encounter medications on file as of 05/21/2021.     Review of Systems  Constitutional:  Negative for chills and fever.  HENT:  Negative for congestion, rhinorrhea and sore throat.   Respiratory:  Negative for cough, shortness of breath and wheezing.    Cardiovascular:  Negative for chest pain and leg swelling.  Gastrointestinal:  Negative for abdominal pain, diarrhea, nausea and vomiting.  Genitourinary:  Negative for dysuria and frequency.  Skin:  Negative for rash.  Neurological:  Negative for dizziness, weakness and headaches.    Vitals BP (!) 154/98   Pulse 74   Temp (!) 97.3 F (36.3 C)   Ht $R'5\' 8"'Ez$  (1.727 m)   Wt 223 lb (101.2 kg)   SpO2 97%   BMI 33.91 kg/m   Objective:   Physical Exam Vitals and nursing note reviewed.  Constitutional:      General: He is not in acute distress.    Appearance: Normal appearance. He is not ill-appearing.  HENT:     Head: Normocephalic.     Nose: Nose normal. No congestion.     Mouth/Throat:     Mouth: Mucous membranes are moist.     Pharynx: No oropharyngeal exudate.  Eyes:     Extraocular Movements: Extraocular movements intact.     Conjunctiva/sclera: Conjunctivae normal.     Pupils: Pupils are equal, round, and reactive to light.  Cardiovascular:     Rate and Rhythm: Normal rate and regular rhythm.     Pulses: Normal pulses.     Heart sounds: Normal heart sounds. No murmur heard. Pulmonary:     Effort: Pulmonary effort is normal.     Breath sounds: Normal breath sounds. No  wheezing, rhonchi or rales.  Musculoskeletal:        General: Normal range of motion.     Right lower leg: No edema.     Left lower leg: No edema.  Skin:    General: Skin is warm and dry.     Findings: No rash.  Neurological:     General: No focal deficit present.     Mental Status: He is alert and oriented to person, place, and time.     Cranial Nerves: No cranial nerve deficit.  Psychiatric:        Mood and Affect: Mood normal.        Behavior: Behavior normal.        Thought Content: Thought content normal.        Judgment: Judgment normal.     Assessment and Plan   1. Primary hypertension - Lipid panel  2. Hypercholesterolemia - CBC - CMP14+EGFR  3. Stage 3 chronic kidney disease,  unspecified whether stage 3a or 3b CKD (HCC)   htn- uncontrolled will inc metoprolol to $RemoveBefor'50mg'cqHcXHdAqESe$  xr daily. Cont lisinopril $RemoveBeforeD'40mg'[id number]$   Pt to get labs today.  Hld- getting labs today. Cont meds.  Ckd- recheck labs today.  Return in about 6 months (around 11/21/2021) for f/u htn, hld.   05/21/2021   BP Readings from Last 3 Encounters:  05/21/21 (!) 154/98  08/28/20 132/78  08/22/19 (!) 148/90

## 2021-05-21 NOTE — Patient Instructions (Signed)
Take two of the 25mg  metoprolol daily, then start on the 50mg  tablet.   Check blood pressure daily - if seeing numbers over 140/85 or under 100/65, call the office.

## 2021-05-22 LAB — LIPID PANEL
Chol/HDL Ratio: 4.1 ratio (ref 0.0–5.0)
Cholesterol, Total: 164 mg/dL (ref 100–199)
HDL: 40 mg/dL (ref >39)
LDL Chol Calc (NIH): 84 mg/dL (ref 0–99)
Triglycerides: 240 mg/dL — ABNORMAL HIGH (ref 0–149)
VLDL Cholesterol Cal: 40 mg/dL (ref 5–40)

## 2021-05-22 LAB — CMP14+EGFR
ALT: 22 IU/L (ref 0–44)
AST: 19 IU/L (ref 0–40)
Albumin/Globulin Ratio: 1.5 (ref 1.2–2.2)
Albumin: 4.6 g/dL (ref 3.7–4.7)
Alkaline Phosphatase: 86 IU/L (ref 44–121)
BUN/Creatinine Ratio: 7 — ABNORMAL LOW (ref 10–24)
BUN: 9 mg/dL (ref 8–27)
Bilirubin Total: <0.2 mg/dL (ref 0.0–1.2)
CO2: 23 mmol/L (ref 20–29)
Calcium: 9.7 mg/dL (ref 8.6–10.2)
Chloride: 105 mmol/L (ref 96–106)
Creatinine, Ser: 1.34 mg/dL — ABNORMAL HIGH (ref 0.76–1.27)
Globulin, Total: 3.1 g/dL (ref 1.5–4.5)
Glucose: 100 mg/dL — ABNORMAL HIGH (ref 65–99)
Potassium: 5.2 mmol/L (ref 3.5–5.2)
Sodium: 140 mmol/L (ref 134–144)
Total Protein: 7.7 g/dL (ref 6.0–8.5)
eGFR: 57 mL/min/{1.73_m2} — ABNORMAL LOW (ref >59)

## 2021-05-22 LAB — CBC
Hematocrit: 45.1 % (ref 37.5–51.0)
Hemoglobin: 15.1 g/dL (ref 13.0–17.7)
MCH: 29.7 pg (ref 26.6–33.0)
MCHC: 33.5 g/dL (ref 31.5–35.7)
MCV: 89 fL (ref 79–97)
Platelets: 249 10*3/uL (ref 150–450)
RBC: 5.08 x10E6/uL (ref 4.14–5.80)
RDW: 14.2 % (ref 11.6–15.4)
WBC: 8.1 10*3/uL (ref 3.4–10.8)

## 2021-06-07 ENCOUNTER — Encounter: Payer: Self-pay | Admitting: Family Medicine

## 2021-06-16 ENCOUNTER — Other Ambulatory Visit: Payer: Self-pay | Admitting: Family Medicine

## 2021-07-07 ENCOUNTER — Ambulatory Visit (INDEPENDENT_AMBULATORY_CARE_PROVIDER_SITE_OTHER): Payer: Medicare Other | Admitting: Behavioral Health

## 2021-07-07 ENCOUNTER — Other Ambulatory Visit: Payer: Self-pay

## 2021-07-07 ENCOUNTER — Encounter: Payer: Self-pay | Admitting: Behavioral Health

## 2021-07-07 DIAGNOSIS — G2401 Drug induced subacute dyskinesia: Secondary | ICD-10-CM

## 2021-07-07 DIAGNOSIS — F411 Generalized anxiety disorder: Secondary | ICD-10-CM | POA: Diagnosis not present

## 2021-07-07 DIAGNOSIS — F410 Panic disorder [episodic paroxysmal anxiety] without agoraphobia: Secondary | ICD-10-CM

## 2021-07-07 DIAGNOSIS — G4700 Insomnia, unspecified: Secondary | ICD-10-CM

## 2021-07-07 DIAGNOSIS — G2111 Neuroleptic induced parkinsonism: Secondary | ICD-10-CM

## 2021-07-07 NOTE — Progress Notes (Signed)
[facility]  Patient ID: Devin Daugherty,  MRN: [id number]  PCP: Annalee Genta, DO  Date of Evaluation: 07/07/2021 Time spent:30 minutes  Chief Complaint:   HISTORY/CURRENT STATUS: HPI 70 year old male presents to this office for follow up and medication management. His wife is present with consent. He says, "I've been doing pretty good. 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 Pt says that other than some continued fatigue he is able to cope with his anxiety and get things done. He is going to his grandchildren's baseball game. He understands his mild symptoms of TD but does not want to add medication at this time. He reports anxiety at 3/10 and depression at 2/10. Says he sleeps 7-8 hours per night. He wants to follow up in 3 months and no medication adjustment right now. No mania, no psychosis. No SI/HI.    Past psychiatric medication failures: patient did not bring med list of prior med failures and could not remember except for: Prozac Paxil  Ingrezza   Individual Medical History/ Review of Systems: Changes? :No   Allergies: Codeine  Current Medications:  Current Outpatient Medications:    gabapentin (NEURONTIN) 400 MG capsule, Take 1 capsule (400 mg total) by mouth 3 (three) times daily., Disp: 90 capsule, Rfl: 2   lisinopril (ZESTRIL) 40 MG tablet, Take by mouth., Disp: , Rfl:    LORazepam (ATIVAN) 0.5 MG tablet, One by mouth up to three times daily for anxiety.- per psychiatrist, Disp: 90 tablet, Rfl: 2   metoprolol succinate (TOPROL-XL) 25 MG 24 hr tablet, Take by mouth., Disp: , Rfl:    mirtazapine (REMERON) 30 MG tablet, Take 1 tablet (30 mg total) by mouth at bedtime., Disp: 90 tablet, Rfl: 0   RISPERDAL 0.5 MG tablet, Take 1 tablet (0.5 mg total) by mouth 2 (two) times daily., Disp: 60 tablet, Rfl: 2   rosuvastatin (CRESTOR) 40 MG tablet, TAKE 1 TABLET BY MOUTH DAILY, Disp: 90 tablet, Rfl: 2    sertraline (ZOLOFT) 100 MG tablet, Take 100 mg by mouth daily. Take 2 tablets, Disp: , Rfl:    sildenafil (REVATIO) 20 MG tablet, Take 2-3 tablets po 1-2 hrs before sex, Disp: 24 tablet, Rfl: 5   zolpidem (AMBIEN) 5 MG tablet, Take 1 tablet (5 mg total) by mouth at bedtime as needed for sleep., Disp: 30 tablet, Rfl: 2 Medication Side Effects: none  Family Medical/ Social History: Changes? No  MENTAL HEALTH EXAM:  There were no vitals taken for this visit.There is no height or weight on file to calculate BMI.  General Appearance: Casual, Neat, and Well Groomed  Eye Contact:  Good  Speech:  Clear and Coherent  Volume:  Normal  Mood:  Anxious  Affect:  Appropriate  Thought Process:  Coherent  Orientation:  Full (Time, Place, and Person)  Thought Content: Logical   Suicidal Thoughts:  No  Homicidal Thoughts:  No  Memory:  WNL  Judgement:  Good  Insight:  Good  Psychomotor Activity:  Normal  Concentration:  Concentration: Good  Recall:  Good  Fund of Knowledge: Good  Language: Good  Assets:  Desire for Improvement  ADL's:  Intact  Cognition: WNL  Prognosis:  Good    DIAGNOSES: No diagnosis found.  Receiving Psychotherapy: No    RECOMMENDATIONS:   Continue on current medication regimen To continue Gabapentin to 400 mg 3 times daily Will report and side effects or worsening To follow up in  3 months to reassess anxiety  Greater than 50% of  30 min. face to face time with patient was spent on counseling and coordination of care. We discussed his continued symptoms of TD and him stopping administration of Ingrezza for fatigue. Patient lab values in May 2022 have improved since previous lab. Pt advised to continue to monitor every 6 months with PCP. His Creatnine, BUN/Creatnine Ration, and GFR remain slightly skewed. Educated patient on increasing his hydration to consuming half his body weight in OZ. Water daily. Obtain new labs in 5 months.    Joan Flores, NP

## 2021-07-13 ENCOUNTER — Ambulatory Visit: Payer: Medicare Other | Admitting: Behavioral Health

## 2021-08-04 ENCOUNTER — Other Ambulatory Visit: Payer: Self-pay

## 2021-08-04 DIAGNOSIS — F411 Generalized anxiety disorder: Secondary | ICD-10-CM

## 2021-08-04 DIAGNOSIS — F331 Major depressive disorder, recurrent, moderate: Secondary | ICD-10-CM

## 2021-08-04 MED ORDER — GABAPENTIN 400 MG PO CAPS
400.0000 mg | ORAL_CAPSULE | Freq: Three times a day (TID) | ORAL | 5 refills | Status: DC
Start: 2021-08-04 — End: 2021-10-15

## 2021-08-05 DIAGNOSIS — E782 Mixed hyperlipidemia: Secondary | ICD-10-CM | POA: Diagnosis not present

## 2021-08-05 DIAGNOSIS — I2510 Atherosclerotic heart disease of native coronary artery without angina pectoris: Secondary | ICD-10-CM | POA: Diagnosis not present

## 2021-08-05 DIAGNOSIS — I10 Essential (primary) hypertension: Secondary | ICD-10-CM | POA: Diagnosis not present

## 2021-08-13 ENCOUNTER — Other Ambulatory Visit: Payer: Self-pay

## 2021-08-13 ENCOUNTER — Telehealth: Payer: Self-pay | Admitting: Behavioral Health

## 2021-08-13 DIAGNOSIS — F410 Panic disorder [episodic paroxysmal anxiety] without agoraphobia: Secondary | ICD-10-CM

## 2021-08-13 DIAGNOSIS — F411 Generalized anxiety disorder: Secondary | ICD-10-CM

## 2021-08-13 DIAGNOSIS — G4700 Insomnia, unspecified: Secondary | ICD-10-CM

## 2021-08-13 MED ORDER — LORAZEPAM 0.5 MG PO TABS: ORAL_TABLET | ORAL | 2 refills | Status: DC

## 2021-08-13 MED ORDER — MIRTAZAPINE 30 MG PO TABS: 30.0000 mg | ORAL_TABLET | Freq: Every day | ORAL | 0 refills | Status: DC

## 2021-08-13 NOTE — Telephone Encounter (Signed)
Pt called and needs a refill on his ativan and mirtazapine. Please send to [facility]. He is out of his ativan

## 2021-08-13 NOTE — Telephone Encounter (Signed)
Pended.

## 2021-08-14 ENCOUNTER — Other Ambulatory Visit: Payer: Self-pay | Admitting: Behavioral Health

## 2021-08-19 ENCOUNTER — Other Ambulatory Visit: Payer: Self-pay

## 2021-08-19 DIAGNOSIS — F331 Major depressive disorder, recurrent, moderate: Secondary | ICD-10-CM

## 2021-08-19 DIAGNOSIS — F411 Generalized anxiety disorder: Secondary | ICD-10-CM

## 2021-08-19 MED ORDER — RISPERDAL 0.5 MG PO TABS: 0.5000 mg | ORAL_TABLET | Freq: Two times a day (BID) | ORAL | 2 refills | Status: DC

## 2021-08-26 ENCOUNTER — Other Ambulatory Visit: Payer: Self-pay

## 2021-08-26 ENCOUNTER — Ambulatory Visit (INDEPENDENT_AMBULATORY_CARE_PROVIDER_SITE_OTHER): Payer: Medicare Other | Admitting: Internal Medicine

## 2021-08-26 ENCOUNTER — Encounter: Payer: Self-pay | Admitting: Internal Medicine

## 2021-08-26 VITALS — BP 148/88 | HR 66 | Temp 97.7°F | Resp 18 | Ht 69.0 in | Wt 224.4 lb

## 2021-08-26 DIAGNOSIS — F419 Anxiety disorder, unspecified: Secondary | ICD-10-CM

## 2021-08-26 DIAGNOSIS — I129 Hypertensive chronic kidney disease with stage 1 through stage 4 chronic kidney disease, or unspecified chronic kidney disease: Secondary | ICD-10-CM

## 2021-08-26 DIAGNOSIS — T43505A Adverse effect of unspecified antipsychotics and neuroleptics, initial encounter: Secondary | ICD-10-CM

## 2021-08-26 DIAGNOSIS — N1831 Chronic kidney disease, stage 3a: Secondary | ICD-10-CM

## 2021-08-26 DIAGNOSIS — Z7189 Other specified counseling: Secondary | ICD-10-CM

## 2021-08-26 DIAGNOSIS — E782 Mixed hyperlipidemia: Secondary | ICD-10-CM | POA: Insufficient documentation

## 2021-08-26 DIAGNOSIS — G2111 Neuroleptic induced parkinsonism: Secondary | ICD-10-CM

## 2021-08-26 DIAGNOSIS — Z0001 Encounter for general adult medical examination with abnormal findings: Secondary | ICD-10-CM

## 2021-08-26 DIAGNOSIS — I2510 Atherosclerotic heart disease of native coronary artery without angina pectoris: Secondary | ICD-10-CM | POA: Diagnosis not present

## 2021-08-26 DIAGNOSIS — Z0000 Encounter for general adult medical examination without abnormal findings: Secondary | ICD-10-CM

## 2021-08-26 DIAGNOSIS — Z23 Encounter for immunization: Secondary | ICD-10-CM | POA: Diagnosis not present

## 2021-08-26 DIAGNOSIS — E559 Vitamin D deficiency, unspecified: Secondary | ICD-10-CM

## 2021-08-26 DIAGNOSIS — Z125 Encounter for screening for malignant neoplasm of prostate: Secondary | ICD-10-CM

## 2021-08-26 DIAGNOSIS — F329 Major depressive disorder, single episode, unspecified: Secondary | ICD-10-CM

## 2021-08-26 DIAGNOSIS — I10 Essential (primary) hypertension: Secondary | ICD-10-CM

## 2021-08-26 DIAGNOSIS — Z7689 Persons encountering health services in other specified circumstances: Secondary | ICD-10-CM

## 2021-08-26 DIAGNOSIS — Z1159 Encounter for screening for other viral diseases: Secondary | ICD-10-CM

## 2021-08-26 DIAGNOSIS — G4700 Insomnia, unspecified: Secondary | ICD-10-CM

## 2021-08-26 DIAGNOSIS — E538 Deficiency of other specified B group vitamins: Secondary | ICD-10-CM

## 2021-08-26 DIAGNOSIS — Z9861 Coronary angioplasty status: Secondary | ICD-10-CM

## 2021-08-26 MED ORDER — ROSUVASTATIN CALCIUM 40 MG PO TABS: 40.0000 mg | ORAL_TABLET | Freq: Every day | ORAL | 3 refills | Status: DC

## 2021-08-26 NOTE — Assessment & Plan Note (Addendum)
Tremors likely due to antipsychotic Has been given Ingrezza in the past for TD, but is not taking it currently.

## 2021-08-26 NOTE — Progress Notes (Signed)
New Patient Office Visit  Subjective:  Patient ID: Devin Daugherty, male    DOB: March 08, 1951  Age: 70 y.o. MRN: [id number]  CC:  Chief Complaint  Patient presents with   New Patient (Initial Visit)    New patient was seeing dr Gerda Diss just establishing care     HPI Devin Daugherty is a 70 year old male with PMH of HTN, CAD s/p stent placement, HLD, depression with anxiety, CKD stage 3a, and medication induced parkinsonism who presents for establishing care. He is a former patient of Dr Luking/Dr Ladona Ridgel.  HTN: BP was elevated today. States that his BP remains better at home, but it is usually high when he comes at [employer] office. Denies any headache, dizziness, chest pain, dyspnea or palpitations currently.  CAD: S/p stent placement, on Aspirin and statin, followed by Cardiology. He has been having intermittent episodes of atypical chest pain, for which he was started on Imdur, but he stopped taking it as he had intermittent episodes of headache with it.  Depression with anxiety: Followed by Psychiatry. He is on multiple medications. Denies any SI or HI.  Tremors/TD: He has resting tremors of the left hand. Of note, he has been on Risperidone for resistant depression. He has had Neurology evaluation for tremors and TD and he was told that his symptoms are due to Risperidone. Wife states that he had to be admitted for anxiety and depression before he started taking Risperidone and they are afraid to change the medication currently.  CKD stage 3a: Denies any dysuria, hematuria or urinary hesitance currently.  He has had 2 doses of COVID vaccine. He received PCV20 in the office today. He denied flu vaccine.    Past Medical History:  Diagnosis Date   Anxiety    CAD (coronary artery disease)    Depression    High cholesterol    Hypertension     Past Surgical History:  Procedure Laterality Date   COLONOSCOPY     CORONARY ANGIOPLASTY WITH STENT PLACEMENT  05/2018   Heart  Stent   2009   HERNIA REPAIR      Family History  Problem Relation Age of Onset   Heart attack Father    Hyperlipidemia Son     Social History   Socioeconomic History   Marital status: Married    Spouse name: Not on file   Number of children: 2   Years of education: 14   Highest education level: Associate degree: occupational, Scientist, product/process development, or vocational program  Occupational History   Occupation: retired    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 Multiple children and grandchildren. 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 Active in local church. Likes to travel with spouse to the beach.    Social Determinants of Health   Financial Resource Strain: Not on file  Food Insecurity: Not on file  Transportation Needs: Not on file  Physical Activity: Not on file  Stress: Not on file  Social Connections: Not on file  Intimate Partner Violence: Not on file    ROS Review of  Systems  Constitutional:  Negative for chills and fever.  HENT:  Negative for congestion and sore throat.   Eyes:  Negative for pain and discharge.  Respiratory:  Negative for cough and shortness of breath.   Cardiovascular:  Positive for chest pain (Intermittent). Negative for palpitations.  Gastrointestinal:  Negative for constipation, diarrhea, nausea and vomiting.  Endocrine: Negative for polydipsia and polyuria.  Genitourinary:  Negative for dysuria and hematuria.  Musculoskeletal:  Negative for neck pain and neck stiffness.  Skin:  Negative for rash.  Neurological:  Positive for tremors. Negative for dizziness, weakness, numbness and headaches.  Psychiatric/Behavioral:   Negative for agitation and behavioral problems.    Objective:   Today's Vitals: BP (!) 148/88 (BP Location: Left Arm, Patient Position: Sitting, Cuff Size: Normal)   Pulse 66   Temp 97.7 F (36.5 C) (Oral)   Resp 18   Ht 5\' 9"  (1.753 m)   Wt 224 lb 6.4 oz (101.8 kg)   SpO2 95%   BMI 33.14 kg/m   Physical Exam Vitals reviewed.  Constitutional:      General: He is not in acute distress.    Appearance: He is not diaphoretic.  HENT:     Head: Normocephalic and atraumatic.     Nose: Nose normal.     Mouth/Throat:     Mouth: Mucous membranes are moist.  Eyes:     General: No scleral icterus.    Extraocular Movements: Extraocular movements intact.  Cardiovascular:     Rate and Rhythm: Normal rate and regular rhythm.     Pulses: Normal pulses.     Heart sounds: Normal heart sounds. No murmur heard. Pulmonary:     Breath sounds: Normal breath sounds. No wheezing or rales.  Abdominal:     Palpations: Abdomen is soft.     Tenderness: There is no abdominal tenderness.  Musculoskeletal:     Cervical back: Neck supple. No tenderness.     Right lower leg: No edema.     Left lower leg: No edema.  Skin:    General: Skin is warm.     Findings: No rash.  Neurological:     General: No focal deficit present.     Mental Status: He is alert and oriented to person, place, and time.  Psychiatric:        Mood and Affect: Mood normal.        Behavior: Behavior normal.    Assessment & Plan:   Problem List Items Addressed This Visit       Encounter to establish care   Care established Previous chart reviewed History and medications reviewed with the patient       Cardiovascular and Mediastinum   Hypertension - Primary (Chronic)    BP Readings from Last 1 Encounters:  08/26/21 (!) 148/88  Uncontrolled with Lisinopril and Metoprolol Was started on Imdur by her Cardiologist, but he has not been taking it, advised to start taking Imdur States that it runs better at home, could  be white coat  hypertension effect Counseled for compliance with the medications Advised DASH diet and moderate exercise/walking, at least 150 mins/week      Relevant Medications   isosorbide mononitrate (IMDUR) 30 MG 24 hr tablet   rosuvastatin (CRESTOR) 40 MG tablet   CAD S/P percutaneous coronary angioplasty    Followed by Cardiology On Aspirin and statin On Metoprolol Recently was started on Imdur, advised patient to start taking it.      Relevant Medications  isosorbide mononitrate (IMDUR) 30 MG 24 hr tablet   rosuvastatin (CRESTOR) 40 MG tablet     Nervous and Auditory   Neuroleptic-induced parkinsonism (HCC)    Tremors likely due to antipsychotic Has been given Ingrezza in the past for TD, but is not taking it currently.        Genitourinary   Stage 3 chronic kidney disease (HCC)    Stable stage 3a CKD, reviewed last BMP Avoid nephrotoxic agents Needs to improve fluid intake, at least 64 ounces per day On Lisinopril        Other   Anxiety (Chronic)    Followed by Psychiatry On Risperidone, Gabapentin, Zoloft and PRN Ativan Remeron and Ambien for insomnia      Insomnia    Ambien and Remeron PRN - followed by Psychiatry      MDD (major depressive disorder)    On Zoloft, Mirtazepine, Gabapentin and Risperidone - followed by Psychiatry      Vitamin B12 deficiency   Relevant Orders   B12   Vitamin D deficiency   Relevant Orders   Vitamin D (25 hydroxy)   Mixed hyperlipidemia   Relevant Medications   isosorbide mononitrate (IMDUR) 30 MG 24 hr tablet   rosuvastatin (CRESTOR) 40 MG tablet   Screening for prostate cancer    Ordered PSA after discussing the limitations of it for prostate cancer screening, including false positive result leading to additional testing.      Relevant Orders   PSA         Other Visit Diagnoses     Need for hepatitis C screening test       Relevant Orders   Hepatitis C Antibody   Need for viral immunization        Relevant Orders   Pneumococcal conjugate vaccine 20-valent (Prevnar 20) (Completed)     Outpatient Encounter Medications as of 08/26/2021  Medication Sig   gabapentin (NEURONTIN) 400 MG capsule Take 1 capsule (400 mg total) by mouth 3 (three) times daily.   isosorbide mononitrate (IMDUR) 30 MG 24 hr tablet Take 30 mg by mouth daily.   lisinopril (ZESTRIL) 40 MG tablet Take by mouth.   LORazepam (ATIVAN) 0.5 MG tablet One by mouth up to three times daily for anxiety.- per psychiatrist   metoprolol succinate (TOPROL-XL) 25 MG 24 hr tablet Take by mouth.   mirtazapine (REMERON) 30 MG tablet Take 1 tablet (30 mg total) by mouth at bedtime.   RISPERDAL 0.5 MG tablet Take 1 tablet (0.5 mg total) by mouth 2 (two) times daily.   sertraline (ZOLOFT) 100 MG tablet TAKE 2 TABLETS BY MOUTH EVERY DAY AS NEEDED   sildenafil (REVATIO) 20 MG tablet Take 2-3 tablets po 1-2 hrs before sex   zolpidem (AMBIEN) 5 MG tablet Take 1 tablet (5 mg total) by mouth at bedtime as needed for sleep.   [DISCONTINUED] rosuvastatin (CRESTOR) 40 MG tablet TAKE 1 TABLET BY MOUTH DAILY   rosuvastatin (CRESTOR) 40 MG tablet Take 1 tablet (40 mg total) by mouth daily.   No facility-administered encounter medications on file as of 08/26/2021.    Follow-up: Return in about 4 months (around 12/26/2021) for Annual physical.   Anabel Halon, MD

## 2021-08-26 NOTE — Assessment & Plan Note (Signed)
Care established Previous chart reviewed History and medications reviewed with the patient 

## 2021-08-26 NOTE — Assessment & Plan Note (Signed)
BP Readings from Last 1 Encounters:  08/26/21 (!) 148/88   Uncontrolled with Lisinopril and Metoprolol Was started on Imdur by her Cardiologist, but he has not been taking it, advised to start taking Imdur States that it runs better at home, could be white coat  hypertension effect Counseled for compliance with the medications Advised DASH diet and moderate exercise/walking, at least 150 mins/week

## 2021-08-26 NOTE — Assessment & Plan Note (Signed)
Ordered PSA after discussing the limitations of it for prostate cancer screening, including false positive result leading to additional testing.

## 2021-08-26 NOTE — Assessment & Plan Note (Addendum)
Stable stage 3a CKD, reviewed last BMP Avoid nephrotoxic agents Needs to improve fluid intake, at least 64 ounces per day On Lisinopril 

## 2021-08-26 NOTE — Assessment & Plan Note (Signed)
Followed by Cardiology On Aspirin and statin On Metoprolol Recently was started on Imdur, advised patient to start taking it.

## 2021-08-26 NOTE — Assessment & Plan Note (Signed)
Followed by Psychiatry On Risperidone, Gabapentin, Zoloft and PRN Ativan Remeron and Ambien for insomnia

## 2021-08-26 NOTE — Assessment & Plan Note (Signed)
On Zoloft, Mirtazepine, Gabapentin and Risperidone - followed by Psychiatry

## 2021-08-26 NOTE — Patient Instructions (Signed)
Please start taking Imdur as prescribed by your Cardiologist.  Please continue to take other medications as prescribed.  Please continue to follow low salt diet and ambulate as tolerated.  Please get fasting blood tests done before the next visit.

## 2021-08-26 NOTE — Assessment & Plan Note (Signed)
Ambien and Remeron PRN - followed by Psychiatry

## 2021-08-27 ENCOUNTER — Encounter: Payer: Self-pay | Admitting: Internal Medicine

## 2021-09-10 ENCOUNTER — Ambulatory Visit (INDEPENDENT_AMBULATORY_CARE_PROVIDER_SITE_OTHER): Payer: Medicare Other

## 2021-09-10 ENCOUNTER — Other Ambulatory Visit: Payer: Self-pay

## 2021-09-10 DIAGNOSIS — Z5329 Procedure and treatment not carried out because of patient's decision for other reasons: Secondary | ICD-10-CM

## 2021-09-10 NOTE — Progress Notes (Deleted)
Subjective:   Devin Daugherty is a 70 y.o. male who presents for Medicare Annual/Subsequent preventive examination.I connected with  Devin Daugherty on 09/10/21 by a audio enabled telemedicine application and verified that I am speaking with the correct person using two identifiers.   I discussed the limitations of evaluation and management by telemedicine. The patient expressed understanding and agreed to proceed.   Location of patient:Home  Location of Provider:Office  Persons participating in virtual visit: Devin Daugherty (patient) and Kolt Mcwhirter [place] Review of Systems    Refer to PCP        Objective:    There were no vitals filed for this visit. There is no height or weight on file to calculate BMI.  Advanced Directives 08/22/2019 06/12/2019  Does Patient Have a Medical Advance Directive? No No  Would patient like information on creating a medical advance directive? - No - Patient declined    Current Medications (verified) Outpatient Encounter Medications as of 09/10/2021  Medication Sig   gabapentin (NEURONTIN) 400 MG capsule Take 1 capsule (400 mg total) by mouth 3 (three) times daily.   isosorbide mononitrate (IMDUR) 30 MG 24 hr tablet Take 30 mg by mouth daily.   lisinopril (ZESTRIL) 40 MG tablet Take by mouth.   LORazepam (ATIVAN) 0.5 MG tablet One by mouth up to three times daily for anxiety.- per psychiatrist   metoprolol succinate (TOPROL-XL) 25 MG 24 hr tablet Take by mouth.   mirtazapine (REMERON) 30 MG tablet Take 1 tablet (30 mg total) by mouth at bedtime.   RISPERDAL 0.5 MG tablet Take 1 tablet (0.5 mg total) by mouth 2 (two) times daily.   rosuvastatin (CRESTOR) 40 MG tablet Take 1 tablet (40 mg total) by mouth daily.   sertraline (ZOLOFT) 100 MG tablet TAKE 2 TABLETS BY MOUTH EVERY DAY AS NEEDED   sildenafil (REVATIO) 20 MG tablet Take 2-3 tablets po 1-2 hrs before sex   zolpidem (AMBIEN) 5 MG tablet Take 1 tablet (5 mg total) by mouth at bedtime as needed  for sleep.   No facility-administered encounter medications on file as of 09/10/2021.    Allergies (verified) Codeine   History: Past Medical History:  Diagnosis Date   Anxiety    CAD (coronary artery disease)    Depression    High cholesterol    Hypertension    Past Surgical History:  Procedure Laterality Date   COLONOSCOPY     CORONARY ANGIOPLASTY WITH STENT PLACEMENT  05/2018   Heart  Stent  2009   HERNIA REPAIR     Family History  Problem Relation Age of Onset   Heart attack Father    Hyperlipidemia Son    Social History   Socioeconomic History   Marital status: Married    Spouse name: Not on file   Number of children: 2   Years of education: 14   Highest education level: Associate degree: occupational, Scientist, product/process development, or vocational program  Occupational History   Occupation: retired    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 Multiple children and grandchildren. 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 Active in local church. Likes to travel with spouse to the beach.    Social Determinants of Health   Financial Resource Strain: Not on file  Food Insecurity: Not on file  Transportation Needs: Not on file  Physical Activity: Not on file  Stress: Not on file  Social Connections: Not on file    Tobacco Counseling Counseling given: Not Answered   Clinical Intake:                 Diabetic?NO         Activities of Daily Living In your present state of health, do you have any difficulty performing the following activities: 08/26/2021  Hearing? N  Vision? N  Difficulty concentrating or making decisions? N   Walking or climbing stairs? N  Dressing or bathing? N  Doing errands, shopping? N  Some recent data might be hidden    Patient Care Team: Anabel Halon, MD as PCP - General (Internal Medicine)  Indicate any recent Medical Services you may have received from other than [facility] providers in the past year (date may be approximate).     Assessment:   This is a routine wellness examination for [place].  Hearing/Vision screen No results found.  Dietary issues and exercise activities discussed:     Goals Addressed   None    Depression Screen PHQ 2/9 Scores 08/26/2021 08/29/2020 01/09/2019 07/04/2017  PHQ - 2 Score 0 1 6 2   PHQ- 9 Score 0 4 17 4     Fall Risk Fall Risk  08/26/2021 06/12/2019 01/09/2019  Falls in the past year? 0 0 0  Number falls in past yr: 0 0 -  Injury with Fall? 0 0 -  Risk for fall due to : No Fall Risks - -  Follow up Falls evaluation completed - -    FALL RISK PREVENTION PERTAINING TO THE HOME:  Any stairs in or around the home? {YES/NO:21197} If so, are there any without handrails? {YES/NO:21197} Home free of loose throw rugs in walkways, pet beds, electrical cords, etc? {YES/NO:21197} Adequate lighting in your home to reduce risk of falls? {YES/NO:21197}  ASSISTIVE DEVICES UTILIZED TO PREVENT FALLS:  Life alert? {YES/NO:21197} Use of a cane, walker or w/c? {YES/NO:21197} Grab bars in the bathroom? {YES/NO:21197} Shower chair or bench in shower? {YES/NO:21197} Elevated toilet seat or a handicapped toilet? {YES/NO:21197}  TIMED UP AND GO:  Was the test performed?  N.A .  Length of time to ambulate 10 feet: N/A sec.     Cognitive Function:        Immunizations Immunization History  Administered Date(s) Administered   DTaP 02/13/2011   PFIZER(Purple Top)SARS-COV-2 Vaccination 01/16/2020, 02/07/2020   PNEUMOCOCCAL CONJUGATE-20 08/26/2021    TDAP status: Due, Education has been provided regarding the importance of this vaccine. Advised may  receive this vaccine at local pharmacy or Health Dept. Aware to provide a copy of the vaccination record if obtained from local pharmacy or Health Dept. Verbalized acceptance and understanding.  Flu Vaccine status: Declined, Education has been provided regarding the importance of this vaccine but patient still declined. Advised may receive this vaccine at local pharmacy or Health Dept. Aware to provide a copy of the vaccination record if obtained from local pharmacy or Health Dept. Verbalized acceptance and understanding.  Pneumococcal vaccine status: Due, Education has been provided regarding the importance of this vaccine. Advised may receive this vaccine at local pharmacy or Health Dept. Aware to provide a copy  of the vaccination record if obtained from local pharmacy or Health Dept. Verbalized acceptance and understanding.  Covid-19 vaccine status: Information provided on how to obtain vaccines.   Qualifies for Shingles Vaccine? Yes   Zostavax completed No   Shingrix Completed?: No.    Education has been provided regarding the importance of this vaccine. Patient has been advised to call insurance company to determine out of pocket expense if they have not yet received this vaccine. Advised may also receive vaccine at local pharmacy or Health Dept. Verbalized acceptance and understanding.  Screening Tests Health Maintenance  Topic Date Due   Hepatitis C Screening  Never done   TETANUS/TDAP  Never done   Zoster Vaccines- Shingrix (1 of 2) Never done   COLONOSCOPY (Pts 45-24yrs Insurance coverage will need to be confirmed)  Never done   PNA vac Low Risk Adult (1 of 2 - PCV13) 07/22/2014   COVID-19 Vaccine (3 - Pfizer risk series) 03/06/2020   HPV VACCINES  Aged Out   INFLUENZA VACCINE  Discontinued    Health Maintenance  Health Maintenance Due  Topic Date Due   Hepatitis C Screening  Never done   TETANUS/TDAP  Never done   Zoster Vaccines- Shingrix (1 of 2) Never done   COLONOSCOPY  (Pts 45-89yrs Insurance coverage will need to be confirmed)  Never done   PNA vac Low Risk Adult (1 of 2 - PCV13) 07/22/2014   COVID-19 Vaccine (3 - Pfizer risk series) 03/06/2020    {Colorectal cancer screening:2101809}  Lung Cancer Screening: (Low Dose CT Chest recommended if Age 26-80 years, 30 pack-year currently smoking OR have quit w/in 15years.) {DOES NOT does:27190::"does not"} qualify.   Lung Cancer Screening Referral: NO  Additional Screening:  Hepatitis C Screening: does qualify; Not Completed   Vision Screening: Recommended annual ophthalmology exams for early detection of glaucoma and other disorders of the eye. Is the patient up to date with their annual eye exam?  {YES/NO:21197} Who is the provider or what is the name of the office in which the patient attends annual eye exams? *** If pt is not established with a provider, would they like to be referred to a provider to establish care? {YES/NO:21197}.   Dental Screening: Recommended annual dental exams for proper oral hygiene  Community Resource Referral / Chronic Care Management: CRR required this visit?  {YES/NO:21197}  CCM required this visit?  {YES/NO:21197}     Plan:     I have personally reviewed and noted the following in the patient's chart:   Medical and social history Use of alcohol, tobacco or illicit drugs  Current medications and supplements including opioid prescriptions. {Opioid Prescriptions:[phone]} Functional ability and status Nutritional status Physical activity Advanced directives List of other physicians Hospitalizations, surgeries, and ER visits in previous 12 months Vitals Screenings to include cognitive, depression, and falls Referrals and appointments  In addition, I have reviewed and discussed with patient certain preventive protocols, quality metrics, and best practice recommendations. A written personalized care plan for preventive services as well as general preventive  health recommendations were provided to patient.     Victorio Palm, [facility]   09/10/2021   Nurse Notes: Non Face to Face 30 minute visit

## 2021-10-08 ENCOUNTER — Other Ambulatory Visit: Payer: Self-pay

## 2021-10-08 ENCOUNTER — Telehealth: Payer: Self-pay | Admitting: Behavioral Health

## 2021-10-08 DIAGNOSIS — F410 Panic disorder [episodic paroxysmal anxiety] without agoraphobia: Secondary | ICD-10-CM

## 2021-10-08 DIAGNOSIS — F411 Generalized anxiety disorder: Secondary | ICD-10-CM

## 2021-10-08 MED ORDER — LORAZEPAM 0.5 MG PO TABS: ORAL_TABLET | ORAL | 0 refills | Status: DC

## 2021-10-08 NOTE — Telephone Encounter (Signed)
Pt should have 1 refill left, pharmacy reports none. Pended for Dr. Jennelle Human to send

## 2021-10-08 NOTE — Telephone Encounter (Signed)
Pt requesting refill for Lorazepam @ [facility]. Reported pharmacy said no refills. APT 10/20

## 2021-10-13 ENCOUNTER — Ambulatory Visit: Payer: Medicare Other | Admitting: Behavioral Health

## 2021-10-15 ENCOUNTER — Encounter: Payer: Self-pay | Admitting: Behavioral Health

## 2021-10-15 ENCOUNTER — Other Ambulatory Visit: Payer: Self-pay

## 2021-10-15 ENCOUNTER — Ambulatory Visit (INDEPENDENT_AMBULATORY_CARE_PROVIDER_SITE_OTHER): Payer: Medicare Other | Admitting: Behavioral Health

## 2021-10-15 DIAGNOSIS — F411 Generalized anxiety disorder: Secondary | ICD-10-CM

## 2021-10-15 DIAGNOSIS — G2111 Neuroleptic induced parkinsonism: Secondary | ICD-10-CM

## 2021-10-15 DIAGNOSIS — G4700 Insomnia, unspecified: Secondary | ICD-10-CM

## 2021-10-15 DIAGNOSIS — F410 Panic disorder [episodic paroxysmal anxiety] without agoraphobia: Secondary | ICD-10-CM

## 2021-10-15 DIAGNOSIS — F331 Major depressive disorder, recurrent, moderate: Secondary | ICD-10-CM

## 2021-10-15 MED ORDER — LORAZEPAM 0.5 MG PO TABS: ORAL_TABLET | ORAL | 5 refills | Status: DC

## 2021-10-15 MED ORDER — SERTRALINE HCL 100 MG PO TABS: ORAL_TABLET | ORAL | 3 refills | Status: DC

## 2021-10-15 MED ORDER — GABAPENTIN 400 MG PO CAPS: 400.0000 mg | ORAL_CAPSULE | Freq: Three times a day (TID) | ORAL | 4 refills | Status: DC

## 2021-10-15 MED ORDER — MIRTAZAPINE 30 MG PO TABS: 30.0000 mg | ORAL_TABLET | Freq: Every day | ORAL | 2 refills | Status: DC

## 2021-10-15 MED ORDER — RISPERDAL 0.5 MG PO TABS: 0.5000 mg | ORAL_TABLET | Freq: Two times a day (BID) | ORAL | 5 refills | Status: DC

## 2021-10-15 MED ORDER — ZOLPIDEM TARTRATE 5 MG PO TABS: 5.0000 mg | ORAL_TABLET | Freq: Every evening | ORAL | 5 refills | Status: DC | PRN

## 2021-10-15 NOTE — Progress Notes (Signed)
[facility]  Patient ID: KIPTYN RAFUSE,  MRN: [id number]  PCP: Anabel Halon, MD  Date of Evaluation: 10/15/2021 Time spent:30 minutes  Chief Complaint:  Chief Complaint   Anxiety; Depression; Panic Attack; Follow-up; Medication Refill     HISTORY/CURRENT STATUS: HPI 70 year old male presents to this office for follow up and medication management. He says, "I've been doing pretty good. Has been attending a lot of sporting events for grandchildren. Pt says that other than some continued fatigue he is able to cope with his anxiety and get things done. He understands his mild symptoms of TD but does not want to add medication at this time. He reports anxiety at 3/10 and depression at 2/10. Says he sleeps 7-8 hours per night. He wants to follow up in 3 months and no medication adjustment right now. No mania, no psychosis. No SI/HI.    Past psychiatric medication trials: Prozac Paxil  Ingrezza Risperdal Zoloft Ambien Mirtazapine Gabapentin       Individual Medical History/ Review of Systems: Changes? :No   Allergies: Codeine  Current Medications:  Current Outpatient Medications:    gabapentin (NEURONTIN) 400 MG capsule, Take 1 capsule (400 mg total) by mouth 3 (three) times daily., Disp: 90 capsule, Rfl: 4   isosorbide mononitrate (IMDUR) 30 MG 24 hr tablet, Take 30 mg by mouth daily., Disp: , Rfl:    lisinopril (ZESTRIL) 40 MG tablet, Take by mouth., Disp: , Rfl:    LORazepam (ATIVAN) 0.5 MG tablet, One by mouth up to three times daily for anxiety.- per psychiatrist, Disp: 90 tablet, Rfl: 5   mirtazapine (REMERON) 30 MG tablet, Take 1 tablet (30 mg total) by mouth at bedtime., Disp: 90 tablet, Rfl: 2   RISPERDAL 0.5 MG tablet, Take 1 tablet (0.5 mg total) by mouth 2 (two) times daily., Disp: 60 tablet, Rfl: 5   rosuvastatin (CRESTOR) 40 MG tablet, Take 1 tablet (40 mg total) by mouth daily., Disp: 90 tablet, Rfl: 3   sertraline (ZOLOFT) 100 MG tablet,  TAKE 2 TABLETS BY MOUTH EVERY DAY AS NEEDED, Disp: 180 tablet, Rfl: 3   sildenafil (REVATIO) 20 MG tablet, Take 2-3 tablets po 1-2 hrs before sex, Disp: 24 tablet, Rfl: 5   zolpidem (AMBIEN) 5 MG tablet, Take 1 tablet (5 mg total) by mouth at bedtime as needed for sleep., Disp: 30 tablet, Rfl: 5   metoprolol succinate (TOPROL-XL) 25 MG 24 hr tablet, Take by mouth., Disp: , Rfl:  Medication Side Effects: none  Family Medical/ Social History: Changes? No  MENTAL HEALTH EXAM:  There were no vitals taken for this visit.There is no height or weight on file to calculate BMI.  General Appearance: Casual, Neat, and Well Groomed  Eye Contact:  Good  Speech:  Clear and Coherent  Volume:  Normal  Mood:  NA  Affect:  Appropriate  Thought Process:  Coherent  Orientation:  Full (Time, Place, and Person)  Thought Content: Logical   Suicidal Thoughts:  No  Homicidal Thoughts:  No  Memory:  WNL  Judgement:  Good  Insight:  Good  Psychomotor Activity:  Normal  Concentration:  Concentration: Good  Recall:  Good  Fund of Knowledge: Good  Language: Good  Assets:  Desire for Improvement  ADL's:  Intact  Cognition: WNL  Prognosis:  Good    DIAGNOSES:    ICD-10-CM   1. Neuroleptic-induced parkinsonism (HCC)  G21.11     2. Generalized anxiety disorder  F41.1 LORazepam (ATIVAN) 0.5  MG tablet    RISPERDAL 0.5 MG tablet    sertraline (ZOLOFT) 100 MG tablet    gabapentin (NEURONTIN) 400 MG capsule    3. Panic disorder  F41.0 LORazepam (ATIVAN) 0.5 MG tablet    RISPERDAL 0.5 MG tablet    sertraline (ZOLOFT) 100 MG tablet    4. Insomnia, unspecified type  G47.00 mirtazapine (REMERON) 30 MG tablet    zolpidem (AMBIEN) 5 MG tablet    sertraline (ZOLOFT) 100 MG tablet    5. Major depressive disorder, recurrent episode, moderate (HCC)  F33.1 RISPERDAL 0.5 MG tablet    sertraline (ZOLOFT) 100 MG tablet    gabapentin (NEURONTIN) 400 MG capsule      Receiving Psychotherapy: No     RECOMMENDATIONS:    Continue on current medication regimen To continue Gabapentin to 400 mg 3 times daily Continue Ativan 0.5 mg three times daily as needed Continue Risperdal 0.5 mg twice daily Continue Zoloft 200 mg daily Continue Ambien 5 mg daily at bedtime Will report and side effects or worsening To follow up in 3 months to reassess anxiety  Greater than 50% of  30 min. face to face time with patient was spent on counseling and coordination of care. We discussed his current state of stability. He feels he is in a good place right now. He is concerned with energy levels and would like PCP to check Testosterone and B12 levels again. Says he would consider testosterone therapy if still low. He says was below 300 previously. Educated patient on increasing his hydration to consuming half his body weight in OZ. Water daily. Obtain new labs in 5 months.   Reviewed PDMP   Joan Flores, NP

## 2021-10-19 ENCOUNTER — Encounter: Payer: Self-pay | Admitting: Internal Medicine

## 2021-10-19 ENCOUNTER — Ambulatory Visit (INDEPENDENT_AMBULATORY_CARE_PROVIDER_SITE_OTHER): Payer: Medicare Other | Admitting: Internal Medicine

## 2021-10-19 ENCOUNTER — Other Ambulatory Visit: Payer: Self-pay

## 2021-10-19 VITALS — BP 130/74 | HR 78 | Temp 97.8°F | Ht 68.5 in | Wt 225.0 lb

## 2021-10-19 DIAGNOSIS — R5382 Chronic fatigue, unspecified: Secondary | ICD-10-CM

## 2021-10-19 DIAGNOSIS — I10 Essential (primary) hypertension: Secondary | ICD-10-CM | POA: Diagnosis not present

## 2021-10-19 DIAGNOSIS — E559 Vitamin D deficiency, unspecified: Secondary | ICD-10-CM

## 2021-10-19 DIAGNOSIS — E538 Deficiency of other specified B group vitamins: Secondary | ICD-10-CM

## 2021-10-19 NOTE — Patient Instructions (Signed)
Please get blood tests done in the morning around 8 AM.

## 2021-10-19 NOTE — Progress Notes (Signed)
Acute Office Visit  Subjective:    Patient ID: Devin Daugherty, male    DOB: 1951-09-03, 70 y.o.   MRN: [id number]  Chief Complaint  Patient presents with   Acute Visit    Feels sluggish. Has hx of B12 def. Saw Mental Health Dr. And they recommended him to have his B12, Vitamin D and testosterone levels checked    HPI Patient is in today for c/o fatigue and sluggishness, which is chronic and has been worsening. Denies any dyspnea, chest pain, weight loss, night sweats or change in appetite recently. Of note, he has h/o deficiency of Vitamin D and B12 as well as testosterone deficiency in the past. He also take multiple psychiatric medications for his conditions. Denies any SI or HI.  Past Medical History:  Diagnosis Date   Anxiety    CAD (coronary artery disease)    Depression    High cholesterol    Hypertension     Past Surgical History:  Procedure Laterality Date   COLONOSCOPY     CORONARY ANGIOPLASTY WITH STENT PLACEMENT  05/2018   Heart  Stent  2009   HERNIA REPAIR      Family History  Problem Relation Age of Onset   Heart attack Father    Hyperlipidemia Son     Social History   Socioeconomic History   Marital status: Married    Spouse name: Not on file   Number of children: 2   Years of education: 14   Highest education level: Associate degree: occupational, Hotel manager, or vocational program  Occupational History   Occupation: retired    Fish farm manager: [employer]    Comment: welder  Tobacco Use   Smoking status: Never   Smokeless tobacco: Never  Substance and Sexual Activity   Alcohol use: No   Drug use: No   Sexual activity: Yes    Partners: Female    Birth control/protection: None    Comment: married  Other Topics Concern   Not on file  Social History Narrative   Married, lives at home with spouse. Multiple children and grandchildren. 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 Active in local church. Likes to travel with [place] to [place]  beach.    Social Determinants of Health   Financial Resource Strain: Not on file  Food Insecurity: Not on file  Transportation Needs: Not on file  Physical Activity: Not on file  Stress: Not on file  Social Connections: Not on file  Intimate Partner Violence: Not on file    Outpatient Medications Prior to Visit  Medication Sig Dispense Refill   gabapentin (NEURONTIN) 400 MG capsule Take 1 capsule (400 mg total) by mouth 3 (three) times daily. 90 capsule 4   isosorbide mononitrate (IMDUR) 30 MG 24 hr tablet Take 30 mg by mouth daily.     lisinopril (ZESTRIL) 40 MG tablet Take by mouth.     LORazepam (ATIVAN) 0.5 MG tablet One by mouth up to three times daily for anxiety.- per psychiatrist 90 tablet 5   metoprolol succinate (TOPROL-XL) 25 MG 24 hr tablet Take by mouth.     mirtazapine (REMERON) 30 MG tablet Take 1 tablet (30 mg total) by mouth at bedtime. 90 tablet 2   RISPERDAL 0.5 MG tablet Take 1 tablet (0.5 mg total) by mouth 2 (two) times daily. 60 tablet 5   rosuvastatin (CRESTOR) 40 MG tablet Take 1 tablet (40 mg total) by mouth daily. 90 tablet 3   sertraline (ZOLOFT)  100 MG tablet TAKE 2 TABLETS BY MOUTH EVERY DAY AS NEEDED 180 tablet 3   sildenafil (REVATIO) 20 MG tablet Take 2-3 tablets po 1-2 hrs before sex 24 tablet 5   zolpidem (AMBIEN) 5 MG tablet Take 1 tablet (5 mg total) by mouth at bedtime as needed for sleep. 30 tablet 5   No facility-administered medications prior to visit.    Allergies  Allergen Reactions   Codeine Itching    Review of Systems  Constitutional:  Positive for fatigue. Negative for chills, fever and unexpected weight change.  HENT:  Negative for sinus pressure and sinus pain.   Eyes:  Negative for pain and discharge.  Respiratory:  Negative for cough and shortness of breath.   Cardiovascular:  Negative for chest pain and palpitations.  Gastrointestinal:  Negative for diarrhea, nausea and vomiting.  Genitourinary:  Negative for dysuria and  hematuria.  Musculoskeletal:  Negative for neck pain and neck stiffness.  Skin:  Negative for rash.  Neurological:  Negative for dizziness and seizures.  Psychiatric/Behavioral:  Negative for agitation and behavioral problems.       Objective:    Physical Exam Vitals reviewed.  Constitutional:      General: He is not in acute distress.    Appearance: He is not diaphoretic.  HENT:     Head: Normocephalic and atraumatic.     Nose: Nose normal.     Mouth/Throat:     Mouth: Mucous membranes are moist.  Eyes:     General: No scleral icterus.    Extraocular Movements: Extraocular movements intact.  Cardiovascular:     Rate and Rhythm: Normal rate and regular rhythm.     Pulses: Normal pulses.     Heart sounds: Normal heart sounds. No murmur heard. Pulmonary:     Breath sounds: Normal breath sounds. No wheezing or rales.  Musculoskeletal:     Cervical back: Neck supple. No tenderness.     Right lower leg: No edema.     Left lower leg: No edema.  Skin:    General: Skin is warm.     Findings: No rash.  Neurological:     General: No focal deficit present.     Mental Status: He is alert and oriented to person, place, and time.  Psychiatric:        Mood and Affect: Mood normal.        Behavior: Behavior normal.    BP 130/74 (BP Location: Left Arm, Cuff Size: Normal)   Pulse 78   Temp 97.8 F (36.6 C) (Oral)   Ht 5' 8.5" (1.74 m)   Wt 225 lb (102.1 kg)   SpO2 97%   BMI 33.71 kg/m  Wt Readings from Last 3 Encounters:  10/19/21 225 lb (102.1 kg)  08/26/21 224 lb 6.4 oz (101.8 kg)  05/21/21 223 lb (101.2 kg)    Health Maintenance Due  Topic Date Due   Hepatitis C Screening  Never done   TETANUS/TDAP  Never done   Zoster Vaccines- Shingrix (1 of 2) Never done   COLONOSCOPY (Pts 45-37yr Insurance coverage will need to be confirmed)  Never done   COVID-19 Vaccine (3 - Pfizer risk series) 03/06/2020    There are no preventive care reminders to display for this  patient.   Lab Results  Component Value Date   TSH 1.184 12/06/2014   Lab Results  Component Value Date   WBC 8.1 05/21/2021   HGB 15.1 05/21/2021   HCT 45.1 05/21/2021  MCV 89 05/21/2021   PLT 249 05/21/2021   Lab Results  Component Value Date   NA 140 05/21/2021   K 5.2 05/21/2021   CO2 23 05/21/2021   GLUCOSE 100 (H) 05/21/2021   BUN 9 05/21/2021   CREATININE 1.34 (H) 05/21/2021   BILITOT <0.2 05/21/2021   ALKPHOS 86 05/21/2021   AST 19 05/21/2021   ALT 22 05/21/2021   PROT 7.7 05/21/2021   ALBUMIN 4.6 05/21/2021   CALCIUM 9.7 05/21/2021   ANIONGAP 8 08/22/2019   EGFR 57 (L) 05/21/2021   Lab Results  Component Value Date   CHOL 164 05/21/2021   Lab Results  Component Value Date   HDL 40 05/21/2021   Lab Results  Component Value Date   LDLCALC 84 05/21/2021   Lab Results  Component Value Date   TRIG 240 (H) 05/21/2021   Lab Results  Component Value Date   CHOLHDL 4.1 05/21/2021   Lab Results  Component Value Date   HGBA1C 5.8 12/13/2014       Assessment & Plan:   Problem List Items Addressed This Visit       Cardiovascular and Mediastinum   Hypertension - Primary (Chronic)    BP Readings from Last 1 Encounters:  10/19/21 130/74  Well-controlled with Lisinopril and Metoprolol Was started on Imdur by her Cardiologist Counseled for compliance with the medications Advised DASH diet and moderate exercise/walking, at least 150 mins/week        Other   Vitamin B12 deficiency   Relevant Orders   B12   Vitamin D deficiency   Relevant Orders   Vitamin D (25 hydroxy)   Chronic fatigue    Could be multifactorial - etiologies include vitamin deficiencies, testosterone deficiency, thyroid disorder, medication induced Check TSH + free T4, free testosterone, Vitamin D and B12 BZDs and B-blocker can also cause fatigue      Relevant Orders   TSH + free T4   Testosterone,Free and Total     No orders of the defined types were placed in this  encounter.    Lindell Spar, MD

## 2021-10-19 NOTE — Assessment & Plan Note (Signed)
Could be multifactorial - etiologies include vitamin deficiencies, testosterone deficiency, thyroid disorder, medication induced Check TSH + free T4, free testosterone, Vitamin D and B12 BZDs and B-blocker can also cause fatigue

## 2021-10-19 NOTE — Assessment & Plan Note (Signed)
BP Readings from Last 1 Encounters:  10/19/21 130/74   Well-controlled with Lisinopril and Metoprolol Was started on Imdur by her Cardiologist Counseled for compliance with the medications Advised DASH diet and moderate exercise/walking, at least 150 mins/week

## 2021-10-20 ENCOUNTER — Ambulatory Visit (INDEPENDENT_AMBULATORY_CARE_PROVIDER_SITE_OTHER): Payer: Medicare Other | Admitting: *Deleted

## 2021-10-20 DIAGNOSIS — Z0000 Encounter for general adult medical examination without abnormal findings: Secondary | ICD-10-CM

## 2021-10-20 NOTE — Patient Instructions (Signed)
Devin Daugherty , Thank you for taking time to come for your Medicare Wellness Visit. I appreciate your ongoing commitment to your health goals. Please review the following plan we discussed and let me know if I can assist you in the future.   Screening recommendations/referrals: Colonoscopy: Per patient is up to date. Recommended yearly ophthalmology/optometry visit for glaucoma screening and checkup Recommended yearly dental visit for hygiene and checkup  Vaccinations: Influenza vaccine: Completed Pneumococcal vaccine: Completed Tdap vaccine: Completed Shingles vaccine: Refused    Advanced directives: Information provided  Conditions/risks identified: Hypertension  Next appointment: 1 year  Preventive Care 72 Years and Older, Male Preventive care refers to lifestyle choices and visits with your health care provider that can promote health and wellness. What does preventive care include? A yearly physical exam. This is also called an annual well check. Dental exams once or twice a year. Routine eye exams. Ask your health care provider how often you should have your eyes checked. Personal lifestyle choices, including: Daily care of your teeth and gums. Regular physical activity. Eating a healthy diet. Avoiding tobacco and drug use. Limiting alcohol use. Practicing safe sex. Taking low doses of aspirin every day. Taking vitamin and mineral supplements as recommended by your health care provider. What happens during an annual well check? The services and screenings done by your health care provider during your annual well check will depend on your age, overall health, lifestyle risk factors, and family history of disease. Counseling  Your health care provider may ask you questions about your: Alcohol use. Tobacco use. Drug use. Emotional well-being. Home and relationship well-being. Sexual activity. Eating habits. History of falls. Memory and ability to understand  (cognition). Work and work Astronomer. Screening  You may have the following tests or measurements: Height, weight, and BMI. Blood pressure. Lipid and cholesterol levels. These may be checked every 5 years, or more frequently if you are over 72 years old. Skin check. Lung cancer screening. You may have this screening every year starting at age 72 if you have a 30-pack-year history of smoking and currently smoke or have quit within the past 15 years. Fecal occult blood test (FOBT) of the stool. You may have this test every year starting at age 72. Flexible sigmoidoscopy or colonoscopy. You may have a sigmoidoscopy every 5 years or a colonoscopy every 10 years starting at age 72. Prostate cancer screening. Recommendations will vary depending on your family history and other risks. Hepatitis C blood test. Hepatitis B blood test. Sexually transmitted disease (STD) testing. Diabetes screening. This is done by checking your blood sugar (glucose) after you have not eaten for a while (fasting). You may have this done every 1-3 years. Abdominal aortic aneurysm (AAA) screening. You may need this if you are a current or former smoker. Osteoporosis. You may be screened starting at age 72 if you are at high risk. Talk with your health care provider about your test results, treatment options, and if necessary, the need for more tests. Vaccines  Your health care provider may recommend certain vaccines, such as: Influenza vaccine. This is recommended every year. Tetanus, diphtheria, and acellular pertussis (Tdap, Td) vaccine. You may need a Td booster every 10 years. Zoster vaccine. You may need this after age 72. Pneumococcal 13-valent conjugate (PCV13) vaccine. One dose is recommended after age 72. Pneumococcal polysaccharide (PPSV23) vaccine. One dose is recommended after age 72. Talk to your health care provider about which screenings and vaccines you need and how often you  need them. This  information is not intended to replace advice given to you by your health care provider. Make sure you discuss any questions you have with your health care provider. Document Released: 01/09/2016 Document Revised: 09/01/2016 Document Reviewed: 10/14/2015 Elsevier Interactive Patient Education  2017 [employer] Prevention in the Home Falls can cause injuries. They can happen to people of all ages. There are many things you can do to make your home safe and to help prevent falls. What can I do on the outside of my home? Regularly fix the edges of walkways and driveways and fix any cracks. Remove anything that might make you trip as you walk through a door, such as a raised step or threshold. Trim any bushes or trees on the path to your home. Use bright outdoor lighting. Clear any walking paths of anything that might make someone trip, such as rocks or tools. Regularly check to see if handrails are loose or broken. Make sure that both sides of any steps have handrails. Any raised decks and porches should have guardrails on the edges. Have any leaves, snow, or ice cleared regularly. Use sand or salt on walking paths during winter. Clean up any spills in your garage right away. This includes oil or grease spills. What can I do in the bathroom? Use night lights. Install grab bars by the toilet and in the tub and shower. Do not use towel bars as grab bars. Use non-skid mats or decals in the tub or shower. If you need to sit down in the shower, use a plastic, non-slip stool. Keep the floor dry. Clean up any water that spills on the floor as soon as it happens. Remove soap buildup in the tub or shower regularly. Attach bath mats securely with double-sided non-slip rug tape. Do not have throw rugs and other things on the floor that can make you trip. What can I do in the bedroom? Use night lights. Make sure that you have a light by your bed that is easy to reach. Do not use any sheets or  blankets that are too big for your bed. They should not hang down onto the floor. Have a firm chair that has side arms. You can use this for support while you get dressed. Do not have throw rugs and other things on the floor that can make you trip. What can I do in the kitchen? Clean up any spills right away. Avoid walking on wet floors. Keep items that you use a lot in easy-to-reach places. If you need to reach something above you, use a strong step stool that has a grab bar. Keep electrical cords out of the way. Do not use floor polish or wax that makes floors slippery. If you must use wax, use non-skid floor wax. Do not have throw rugs and other things on the floor that can make you trip. What can I do with my stairs? Do not leave any items on the stairs. Make sure that there are handrails on both sides of the stairs and use them. Fix handrails that are broken or loose. Make sure that handrails are as long as the stairways. Check any carpeting to make sure that it is firmly attached to the stairs. Fix any carpet that is loose or worn. Avoid having throw rugs at the top or bottom of the stairs. If you do have throw rugs, attach them to the floor with carpet tape. Make sure that you have a light switch  at the top of the stairs and the bottom of the stairs. If you do not have them, ask someone to add them for you. What else can I do to help prevent falls? Wear shoes that: Do not have high heels. Have rubber bottoms. Are comfortable and fit you well. Are closed at the toe. Do not wear sandals. If you use a stepladder: Make sure that it is fully opened. Do not climb a closed stepladder. Make sure that both sides of the stepladder are locked into place. Ask someone to hold it for you, if possible. Clearly mark and make sure that you can see: Any grab bars or handrails. First and last steps. Where the edge of each step is. Use tools that help you move around (mobility aids) if they are  needed. These include: Canes. Walkers. Scooters. Crutches. Turn on the lights when you go into a dark area. Replace any light bulbs as soon as they burn out. Set up your furniture so you have a clear path. Avoid moving your furniture around. If any of your floors are uneven, fix them. If there are any pets around you, be aware of where they are. Review your medicines with your doctor. Some medicines can make you feel dizzy. This can increase your chance of falling. Ask your doctor what other things that you can do to help prevent falls. This information is not intended to replace advice given to you by your health care provider. Make sure you discuss any questions you have with your health care provider. Document Released: 10/09/2009 Document Revised: 05/20/2016 Document Reviewed: 01/17/2015 Elsevier Interactive Patient Education  2017 [employer].

## 2021-10-20 NOTE — Progress Notes (Signed)
Subjective:   Devin Daugherty is a 70 y.o. male who presents for Medicare Annual/Subsequent preventive examination. I connected with  IVA POSTEN on 10/20/21 by audio enabled telemedicine application and verified that I am speaking with the correct person using two identifiers.   I discussed the limitations of evaluation and management by telemedicine. The patient expressed understanding and agreed to proceed.   Review of Systems           Objective:    There were no vitals filed for this visit. There is no height or weight on file to calculate BMI.  Advanced Directives 08/22/2019 06/12/2019  Does Patient Have a Medical Advance Directive? No No  Would patient like information on creating a medical advance directive? - No - Patient declined    Current Medications (verified) Outpatient Encounter Medications as of 10/20/2021  Medication Sig   gabapentin (NEURONTIN) 400 MG capsule Take 1 capsule (400 mg total) by mouth 3 (three) times daily.   isosorbide mononitrate (IMDUR) 30 MG 24 hr tablet Take 30 mg by mouth daily.   lisinopril (ZESTRIL) 40 MG tablet Take by mouth.   LORazepam (ATIVAN) 0.5 MG tablet One by mouth up to three times daily for anxiety.- per psychiatrist   metoprolol succinate (TOPROL-XL) 25 MG 24 hr tablet Take by mouth.   mirtazapine (REMERON) 30 MG tablet Take 1 tablet (30 mg total) by mouth at bedtime.   RISPERDAL 0.5 MG tablet Take 1 tablet (0.5 mg total) by mouth 2 (two) times daily.   rosuvastatin (CRESTOR) 40 MG tablet Take 1 tablet (40 mg total) by mouth daily.   sertraline (ZOLOFT) 100 MG tablet TAKE 2 TABLETS BY MOUTH EVERY DAY AS NEEDED   sildenafil (REVATIO) 20 MG tablet Take 2-3 tablets po 1-2 hrs before sex   zolpidem (AMBIEN) 5 MG tablet Take 1 tablet (5 mg total) by mouth at bedtime as needed for sleep.   No facility-administered encounter medications on file as of 10/20/2021.    Allergies (verified) Codeine   History: Past Medical  History:  Diagnosis Date   Anxiety    CAD (coronary artery disease)    Depression    High cholesterol    Hypertension    Past Surgical History:  Procedure Laterality Date   COLONOSCOPY     CORONARY ANGIOPLASTY WITH STENT PLACEMENT  05/2018   Heart  Stent  2009   HERNIA REPAIR     Family History  Problem Relation Age of Onset   Heart attack Father    Hyperlipidemia Son    Social History   Socioeconomic History   Marital status: Married    Spouse name: Not on file   Number of children: 2   Years of education: 14   Highest education level: Associate degree: occupational, Scientist, product/process development, or vocational program  Occupational History   Occupation: retired    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 Multiple children and grandchildren. 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 Active in local church. Likes to travel with spouse to the beach.  Social Determinants of Health   Financial Resource Strain: Not on file  Food Insecurity: Not on file  Transportation Needs: Not on file  Physical Activity: Not on file  Stress: Not on file  Social Connections: Not on file    Tobacco Counseling Counseling given: Not Answered   Clinical Intake:                 Diabetic?No         Activities of Daily Living In your present state of health, do you have any difficulty performing the following activities: 08/26/2021  Hearing? N  Vision? N  Difficulty concentrating or making decisions? N  Walking or climbing stairs? N  Dressing or bathing? N  Doing errands, shopping? N  Some recent data might be hidden    Patient Care  Team: Anabel Halon, MD as PCP - General (Internal Medicine)  Indicate any recent Medical Services you may have received from other than [facility] providers in the past year (date may be approximate).     Assessment:   This is a routine wellness examination for Devin Daugherty.  Hearing/Vision screen No results found.  Dietary issues and exercise activities discussed:     Goals Addressed   None   Depression Screen PHQ 2/9 Scores 08/26/2021 08/29/2020 01/09/2019 07/04/2017  PHQ - 2 Score 0 1 6 2   PHQ- 9 Score 0 4 17 4     Fall Risk Fall Risk  08/26/2021 06/12/2019 01/09/2019  Falls in the past year? 0 0 0  Number falls in past yr: 0 0 -  Injury with Fall? 0 0 -  Risk for fall due to : No Fall Risks - -  Follow up Falls evaluation completed - -    FALL RISK PREVENTION PERTAINING TO THE HOME:  Any stairs in or around the home? Yes  If so, are there any without handrails? No  Home free of loose throw rugs in walkways, pet beds, electrical cords, etc? No  Adequate lighting in your home to reduce risk of falls? Yes   ASSISTIVE DEVICES UTILIZED TO PREVENT FALLS:  Life alert? No  Use of a cane, Sophia Sperry or w/c? No  Grab bars in the bathroom? No  Shower chair or bench in shower? No  Elevated toilet seat or a handicapped toilet? No   TIMED UP AND GO:  Was the test performed? No .  Length of time to ambulate 10 feet: NA sec.     Cognitive Function:        Immunizations Immunization History  Administered Date(s) Administered   DTaP 02/13/2011   PFIZER(Purple Top)SARS-COV-2 Vaccination 01/16/2020, 02/07/2020   PNEUMOCOCCAL CONJUGATE-20 08/26/2021    TDAP status: Due, Education has been provided regarding the importance of this vaccine. Advised may receive this vaccine at local pharmacy or Health Dept. Aware to provide a copy of the vaccination record if obtained from local pharmacy or Health Dept. Verbalized acceptance and understanding.  Flu Vaccine status: Up to  date  Pneumococcal vaccine status: Up to date  Covid-19 vaccine status: Completed vaccines  Qualifies for Shingles Vaccine? Yes   Zostavax completed No   Shingrix Completed?: No.    Education has been provided regarding the importance of this vaccine. Patient has been advised to call insurance company to determine out of pocket expense if they have not yet received this vaccine. Advised may also receive vaccine at local pharmacy or Health Dept. Verbalized acceptance and understanding.  Screening Tests Health Maintenance  Topic Date Due  Hepatitis C Screening  Never done   TETANUS/TDAP  Never done   Zoster Vaccines- Shingrix (1 of 2) Never done   COLONOSCOPY (Pts 45-54yrs Insurance coverage will need to be confirmed)  Never done   COVID-19 Vaccine (3 - Pfizer risk series) 03/06/2020   Pneumonia Vaccine 73+ Years old  Completed   HPV VACCINES  Aged Out   INFLUENZA VACCINE  Discontinued    Health Maintenance  Health Maintenance Due  Topic Date Due   Hepatitis C Screening  Never done   TETANUS/TDAP  Never done   Zoster Vaccines- Shingrix (1 of 2) Never done   COLONOSCOPY (Pts 45-53yrs Insurance coverage will need to be confirmed)  Never done   COVID-19 Vaccine (3 - Pfizer risk series) 03/06/2020    Colorectal cancer screening: Type of screening: Colonoscopy. Completed Per pt within past 10 years. Repeat every 10 years  Lung Cancer Screening: (Low Dose CT Chest recommended if Age 25-80 years, 30 pack-year currently smoking OR have quit w/in 15years.) does not qualify.   Lung Cancer Screening Referral: NA  Additional Screening:  Hepatitis C Screening: does not qualify; Completed 2022  Vision Screening: Recommended annual ophthalmology exams for early detection of glaucoma and other disorders of the eye. Is the patient up to date with their annual eye exam?  No  Who is the provider or what is the name of the office in which the patient attends annual eye exams? Patient is  established with eye Dr. Stann Mainland call to schedule If pt is not established with a provider, would they like to be referred to a provider to establish care? No .   Dental Screening: Recommended annual dental exams for proper oral hygiene  Community Resource Referral / Chronic Care Management: CRR required this visit?  No   CCM required this visit?  No      Plan:     I have personally reviewed and noted the following in the patient's chart:   Medical and social history Use of alcohol, tobacco or illicit drugs  Current medications and supplements including opioid prescriptions. Patient is not currently taking opioid prescriptions. Functional ability and status Nutritional status Physical activity Advanced directives List of other physicians Hospitalizations, surgeries, and ER visits in previous 12 months Vitals Screenings to include cognitive, depression, and falls Referrals and appointments  In addition, I have reviewed and discussed with patient certain preventive protocols, quality metrics, and best practice recommendations. A written personalized care plan for preventive services as well as general preventive health recommendations were provided to patient.     Sydnee Levans, CMA   10/20/2021   Nurse Notes: This was a Telehealth visit. Patient was at home. Provider was in the office and is Trena Platt, MD

## 2021-10-22 DIAGNOSIS — I2510 Atherosclerotic heart disease of native coronary artery without angina pectoris: Secondary | ICD-10-CM | POA: Diagnosis not present

## 2021-10-23 DIAGNOSIS — E538 Deficiency of other specified B group vitamins: Secondary | ICD-10-CM | POA: Diagnosis not present

## 2021-10-23 DIAGNOSIS — R5382 Chronic fatigue, unspecified: Secondary | ICD-10-CM | POA: Diagnosis not present

## 2021-10-23 DIAGNOSIS — E559 Vitamin D deficiency, unspecified: Secondary | ICD-10-CM | POA: Diagnosis not present

## 2021-10-27 ENCOUNTER — Telehealth: Payer: Self-pay

## 2021-10-27 LAB — TSH+FREE T4
Free T4: 0.92 ng/dL (ref 0.82–1.77)
TSH: 2.54 u[IU]/mL (ref 0.450–4.500)

## 2021-10-27 LAB — VITAMIN B12: Vitamin B-12: 388 pg/mL (ref 232–1245)

## 2021-10-27 LAB — VITAMIN D 25 HYDROXY (VIT D DEFICIENCY, FRACTURES): Vit D, 25-Hydroxy: 25.6 ng/mL — ABNORMAL LOW (ref 30.0–100.0)

## 2021-10-27 LAB — TESTOSTERONE,FREE AND TOTAL
Testosterone, Free: 3.6 pg/mL — ABNORMAL LOW (ref 6.6–18.1)
Testosterone: 224 ng/dL — ABNORMAL LOW (ref 264–916)

## 2021-10-27 NOTE — Telephone Encounter (Signed)
Pt notified of results with verbal understanding  

## 2021-10-27 NOTE — Telephone Encounter (Signed)
Patient spouse called asked for nurse to give him a call about lab results. Call back # [phone]

## 2021-10-29 DIAGNOSIS — E782 Mixed hyperlipidemia: Secondary | ICD-10-CM | POA: Diagnosis not present

## 2021-10-29 DIAGNOSIS — I10 Essential (primary) hypertension: Secondary | ICD-10-CM | POA: Diagnosis not present

## 2021-10-29 DIAGNOSIS — I2510 Atherosclerotic heart disease of native coronary artery without angina pectoris: Secondary | ICD-10-CM | POA: Diagnosis not present

## 2021-10-29 DIAGNOSIS — R079 Chest pain, unspecified: Secondary | ICD-10-CM | POA: Diagnosis not present

## 2021-12-01 ENCOUNTER — Encounter: Payer: Self-pay | Admitting: Internal Medicine

## 2021-12-01 ENCOUNTER — Other Ambulatory Visit: Payer: Self-pay

## 2021-12-01 ENCOUNTER — Ambulatory Visit (INDEPENDENT_AMBULATORY_CARE_PROVIDER_SITE_OTHER): Payer: Medicare Other | Admitting: Internal Medicine

## 2021-12-01 VITALS — BP 138/82 | HR 86 | Resp 18 | Ht 68.0 in | Wt 226.1 lb

## 2021-12-01 DIAGNOSIS — E559 Vitamin D deficiency, unspecified: Secondary | ICD-10-CM

## 2021-12-01 DIAGNOSIS — E538 Deficiency of other specified B group vitamins: Secondary | ICD-10-CM | POA: Diagnosis not present

## 2021-12-01 DIAGNOSIS — R5382 Chronic fatigue, unspecified: Secondary | ICD-10-CM

## 2021-12-01 DIAGNOSIS — E349 Endocrine disorder, unspecified: Secondary | ICD-10-CM | POA: Insufficient documentation

## 2021-12-01 DIAGNOSIS — I10 Essential (primary) hypertension: Secondary | ICD-10-CM | POA: Diagnosis not present

## 2021-12-01 MED ORDER — TESTOSTERONE CYPIONATE 200 MG/ML IJ SOLN: 100.0000 mg | INTRAMUSCULAR | 1 refills | Status: DC

## 2021-12-01 MED ORDER — "BD SYRINGE/NEEDLE 22G X 1-1/2"" 3 ML MISC": 1.0000 | 0 refills | Status: DC

## 2021-12-01 NOTE — Patient Instructions (Addendum)
Please start taking Testosterone injections as prescribed.  Please continue taking Vitamin D and B12 as discussed.  Please contact us if you have any trouble with testosterone injections.

## 2021-12-01 NOTE — Assessment & Plan Note (Signed)
Last vitamin D Lab Results  Component Value Date   VD25OH 25.6 (L) 10/23/2021   Continue vitamin D 2000 IU daily

## 2021-12-01 NOTE — Assessment & Plan Note (Signed)
Started testosterone 100 mg every 2 weeks Discussed about different formulations of testosterone

## 2021-12-01 NOTE — Assessment & Plan Note (Signed)
Continue vitamin B12 1000 mcg daily.

## 2021-12-01 NOTE — Assessment & Plan Note (Signed)
Appears to be multifactorial Slightly better with vitamin D and B12 supplements Started testosterone injection for testosterone deficiency

## 2021-12-01 NOTE — Assessment & Plan Note (Signed)
BP Readings from Last 1 Encounters:  12/01/21 138/82   Well-controlled with Lisinopril and Metoprolol Was started on Imdur by her Cardiologist Counseled for compliance with the medications Advised DASH diet and moderate exercise/walking, at least 150 mins/week

## 2021-12-01 NOTE — Progress Notes (Signed)
Acute Office Visit  Subjective:    Patient ID: Devin Daugherty, male    DOB: 08/27/1951, 70 y.o.   MRN: [id number]  Chief Complaint  Patient presents with   Follow-up    1 month follow up     HPI Patient is in today for f/u of blood tests and c/o chronic fatigue.  He has started taking vitamin D and vitamin B-12 supplements.  He feels slightly better now and has energy to do his routine activities with less discomfort.  He still complains of fatigue, but is better compared to prior. Denies any dyspnea, chest pain, weight loss, night sweats or change in appetite recently. He also take multiple psychiatric medications for his conditions. Denies any SI or HI.  Blood tests were reviewed with the patient and his wife in detail.  His testosterone level was found to be low and they agree to start testosterone injectable medicine for now.  I had discussion about different formulations of testosterone.  Past Medical History:  Diagnosis Date   Anxiety    CAD (coronary artery disease)    Depression    High cholesterol    Hypertension     Past Surgical History:  Procedure Laterality Date   COLONOSCOPY     CORONARY ANGIOPLASTY WITH STENT PLACEMENT  05/2018   Heart  Stent  2009   HERNIA REPAIR      Family History  Problem Relation Age of Onset   Heart attack Father    Hyperlipidemia Son     Social History   Socioeconomic History   Marital status: Married    Spouse name: Not on file   Number of children: 2   Years of education: 14   Highest education level: Associate degree: occupational, Hotel manager, or vocational program  Occupational History   Occupation: retired    Fish farm manager: [employer]    Comment: welder  Tobacco Use   Smoking status: Never   Smokeless tobacco: Never  Substance and Sexual Activity   Alcohol use: No   Drug use: No   Sexual activity: Yes    Partners: Female    Birth control/protection: None    Comment: married  Other Topics Concern   Not on file   Social History Narrative   Married, lives at home with spouse. Multiple children and grandchildren. 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 Active in local church. Likes to travel with spouse to the beach.    Social Determinants of Health   Financial Resource Strain: Low Risk    Difficulty of Paying Living Expenses: Not hard at all  Food Insecurity: No Food Insecurity   Worried About Charity fundraiser in the Last Year: Never true   [place] in the Last Year: Never true  Transportation Needs: No Transportation Needs   Lack of Transportation (Medical): No   Lack of Transportation (Non-Medical): No  Physical Activity: Sufficiently Active   Days of Exercise per Week: 7 days   Minutes of Exercise per Session: 60 min  Stress: No Stress Concern Present   Feeling of Stress : Not at all  Social Connections: Moderately Integrated   Frequency of Communication with Friends and Family: Three times a week   Frequency of Social Gatherings with Friends and Family: Three times a week   Attends Religious Services: More than 4 times per year   Active Member of Clubs or Organizations: No   Attends Archivist Meetings: Never   Marital  Status: Married  Human resources officer Violence: Not At Risk   Fear of Current or Ex-Partner: No   Emotionally Abused: No   Physically Abused: No   Sexually Abused: No    Outpatient Medications Prior to Visit  Medication Sig Dispense Refill   gabapentin (NEURONTIN) 400 MG capsule Take 1 capsule (400 mg total) by mouth 3 (three) times daily. 90 capsule 4   isosorbide mononitrate (IMDUR) 30 MG 24 hr tablet Take 30 mg by mouth daily.     lisinopril (ZESTRIL) 40 MG tablet Take by mouth.     LORazepam (ATIVAN) 0.5 MG tablet One by mouth up to three times daily for anxiety.- per psychiatrist 90 tablet 5   metoprolol succinate (TOPROL-XL) 25 MG 24 hr tablet Take by mouth.     mirtazapine (REMERON) 30 MG tablet Take 1 tablet (30 mg total) by mouth  at bedtime. 90 tablet 2   RISPERDAL 0.5 MG tablet Take 1 tablet (0.5 mg total) by mouth 2 (two) times daily. 60 tablet 5   rosuvastatin (CRESTOR) 40 MG tablet Take 1 tablet (40 mg total) by mouth daily. 90 tablet 3   sertraline (ZOLOFT) 100 MG tablet TAKE 2 TABLETS BY MOUTH EVERY DAY AS NEEDED 180 tablet 3   sildenafil (REVATIO) 20 MG tablet Take 2-3 tablets po 1-2 hrs before sex 24 tablet 5   zolpidem (AMBIEN) 5 MG tablet Take 1 tablet (5 mg total) by mouth at bedtime as needed for sleep. 30 tablet 5   No facility-administered medications prior to visit.    Allergies  Allergen Reactions   Codeine Itching    Review of Systems  Constitutional:  Positive for fatigue. Negative for chills, fever and unexpected weight change.  HENT:  Negative for sinus pressure and sinus pain.   Eyes:  Negative for pain and discharge.  Respiratory:  Negative for cough and shortness of breath.   Cardiovascular:  Negative for chest pain and palpitations.  Gastrointestinal:  Negative for diarrhea, nausea and vomiting.  Genitourinary:  Negative for dysuria and hematuria.  Musculoskeletal:  Negative for neck pain and neck stiffness.  Skin:  Negative for rash.  Neurological:  Negative for dizziness and seizures.  Psychiatric/Behavioral:  Negative for agitation and behavioral problems.       Objective:    Physical Exam Vitals reviewed.  Constitutional:      General: He is not in acute distress.    Appearance: He is not diaphoretic.  HENT:     Head: Normocephalic and atraumatic.     Nose: Nose normal.     Mouth/Throat:     Mouth: Mucous membranes are moist.  Eyes:     General: No scleral icterus.    Extraocular Movements: Extraocular movements intact.  Cardiovascular:     Rate and Rhythm: Normal rate and regular rhythm.     Pulses: Normal pulses.     Heart sounds: Normal heart sounds. No murmur heard. Pulmonary:     Breath sounds: Normal breath sounds. No wheezing or rales.  Musculoskeletal:      Cervical back: Neck supple. No tenderness.     Right lower leg: No edema.     Left lower leg: No edema.  Skin:    General: Skin is warm.     Findings: No rash.  Neurological:     General: No focal deficit present.     Mental Status: He is alert and oriented to person, place, and time.  Psychiatric:        Mood  and Affect: Mood normal.        Behavior: Behavior normal.    BP 138/82 (BP Location: Left Arm, Patient Position: Sitting, Cuff Size: Normal)   Pulse 86   Resp 18   Ht 5\' 8"  (1.727 m)   Wt 226 lb 1.3 oz (102.5 kg)   SpO2 95%   BMI 34.38 kg/m  Wt Readings from Last 3 Encounters:  12/01/21 226 lb 1.3 oz (102.5 kg)  10/19/21 225 lb (102.1 kg)  08/26/21 224 lb 6.4 oz (101.8 kg)        Assessment & Plan:   Problem List Items Addressed This Visit       Cardiovascular and Mediastinum   Hypertension - Primary (Chronic)    BP Readings from Last 1 Encounters:  12/01/21 138/82  Well-controlled with Lisinopril and Metoprolol Was started on Imdur by her Cardiologist Counseled for compliance with the medications Advised DASH diet and moderate exercise/walking, at least 150 mins/week        Other   Vitamin B12 deficiency    Continue vitamin B12 1000 mcg daily      Vitamin D deficiency    Last vitamin D Lab Results  Component Value Date   VD25OH 25.6 (L) 10/23/2021  Continue vitamin D 2000 IU daily      Chronic fatigue    Appears to be multifactorial Slightly better with vitamin D and B12 supplements Started testosterone injection for testosterone deficiency      Testosterone deficiency    Started testosterone 100 mg every 2 weeks Discussed about different formulations of testosterone      Relevant Medications   Testosterone Cypionate 200 MG/ML SOLN   SYRINGE-NEEDLE, DISP, 3 ML (B-D SYRINGE/NEEDLE 3CC/22GX1.5) 22G X 1-1/2" 3 ML MISC     Meds ordered this encounter  Medications   Testosterone Cypionate 200 MG/ML SOLN    Sig: Inject 100 mg as  directed every 14 (fourteen) days.    Dispense:  2 mL    Refill:  1   SYRINGE-NEEDLE, DISP, 3 ML (B-D SYRINGE/NEEDLE 3CC/22GX1.5) 22G X 1-1/2" 3 ML MISC    Sig: 1 each by Does not apply route every 14 (fourteen) days.    Dispense:  30 each    Refill:  0     Jonathyn Carothers 10/25/2021, MD

## 2021-12-02 MED ORDER — "BD SYRINGE/NEEDLE 25G X 5/8"" 3 ML MISC": 1.0000 | 0 refills | Status: DC

## 2021-12-02 NOTE — Addendum Note (Signed)
Addended byTrena Platt on: 12/02/2021 08:25 AM   Modules accepted: Orders

## 2021-12-04 ENCOUNTER — Telehealth: Payer: Self-pay

## 2021-12-04 NOTE — Telephone Encounter (Signed)
Patient called needs prior authorization on testosterone medicine before pharmacy can fill.  Uses [facility].

## 2021-12-07 ENCOUNTER — Telehealth: Payer: Self-pay

## 2021-12-07 NOTE — Telephone Encounter (Signed)
Noted  

## 2021-12-07 NOTE — Telephone Encounter (Signed)
Rhea called from [facility] giving notification of coverage and dates for Testosterone, 1 year starting 12.12.2022 until 12.12.2023.

## 2021-12-07 NOTE — Telephone Encounter (Signed)
PA initiated through [employer]. KEY: [id number]. PA has been approved from 12/07/2021 through 12/07/2022

## 2021-12-31 ENCOUNTER — Ambulatory Visit: Payer: Medicare Other | Admitting: Behavioral Health

## 2021-12-31 ENCOUNTER — Other Ambulatory Visit: Payer: Self-pay

## 2021-12-31 ENCOUNTER — Encounter: Payer: Self-pay | Admitting: Behavioral Health

## 2021-12-31 DIAGNOSIS — G2401 Drug induced subacute dyskinesia: Secondary | ICD-10-CM

## 2021-12-31 DIAGNOSIS — G4700 Insomnia, unspecified: Secondary | ICD-10-CM

## 2021-12-31 DIAGNOSIS — F411 Generalized anxiety disorder: Secondary | ICD-10-CM | POA: Diagnosis not present

## 2021-12-31 DIAGNOSIS — F1320 Sedative, hypnotic or anxiolytic dependence, uncomplicated: Secondary | ICD-10-CM

## 2021-12-31 DIAGNOSIS — F410 Panic disorder [episodic paroxysmal anxiety] without agoraphobia: Secondary | ICD-10-CM | POA: Diagnosis not present

## 2021-12-31 DIAGNOSIS — F331 Major depressive disorder, recurrent, moderate: Secondary | ICD-10-CM

## 2021-12-31 MED ORDER — MIRTAZAPINE 30 MG PO TABS: 30.0000 mg | ORAL_TABLET | Freq: Every day | ORAL | 3 refills | Status: DC

## 2021-12-31 MED ORDER — GABAPENTIN 300 MG PO CAPS: 300.0000 mg | ORAL_CAPSULE | Freq: Three times a day (TID) | ORAL | 1 refills | Status: DC

## 2021-12-31 NOTE — Progress Notes (Signed)
[facility]  Patient ID: Devin Daugherty,  MRN: [id number]  PCP: Lindell Spar, MD  Date of Evaluation: 12/31/2021 Time spent:30 minutes  Chief Complaint:  Chief Complaint   Anxiety; Depression; Panic Attack; Follow-up; Medication Refill; Medication Problem     HISTORY/CURRENT STATUS: HPI 71 year old male presents to this office for follow up and medication management. He says, "I've been doing pretty good. Has been attending a lot of sporting events for grandchildren. Pt says that other than some continued fatigue he is able to cope with his anxiety and get things done. He understands his mild symptoms of TD but does not want to add medication at this time.  He would like to reduce his Gabapentin and Risperidone to see if it help with energy levels. His testosterone was in the low 200's. He reports anxiety at 3/10 and depression at 2/10. Says he sleeps 7-8 hours per night. He wants to follow up in 3 months and no medication adjustment right now. No mania, no psychosis. No SI/HI.    Past psychiatric medication trials: Prozac Paxil  Ingrezza Risperdal Zoloft Ambien Mirtazapine Gabapentin        Individual Medical History/ Review of Systems: Changes? :No   Allergies: Codeine  Current Medications:  Current Outpatient Medications:    gabapentin (NEURONTIN) 300 MG capsule, Take 1 capsule (300 mg total) by mouth 3 (three) times daily., Disp: 90 capsule, Rfl: 1   gabapentin (NEURONTIN) 400 MG capsule, Take 1 capsule (400 mg total) by mouth 3 (three) times daily., Disp: 90 capsule, Rfl: 4   isosorbide mononitrate (IMDUR) 30 MG 24 hr tablet, Take 30 mg by mouth daily., Disp: , Rfl:    lisinopril (ZESTRIL) 40 MG tablet, Take by mouth., Disp: , Rfl:    LORazepam (ATIVAN) 0.5 MG tablet, One by mouth up to three times daily for anxiety.- per psychiatrist, Disp: 90 tablet, Rfl: 5   metoprolol succinate (TOPROL-XL) 25 MG 24 hr tablet, Take by mouth., Disp: , Rfl:     mirtazapine (REMERON) 30 MG tablet, Take 1 tablet (30 mg total) by mouth at bedtime., Disp: 90 tablet, Rfl: 3   RISPERDAL 0.5 MG tablet, Take 1 tablet (0.5 mg total) by mouth 2 (two) times daily., Disp: 60 tablet, Rfl: 5   rosuvastatin (CRESTOR) 40 MG tablet, Take 1 tablet (40 mg total) by mouth daily., Disp: 90 tablet, Rfl: 3   sertraline (ZOLOFT) 100 MG tablet, TAKE 2 TABLETS BY MOUTH EVERY DAY AS NEEDED, Disp: 180 tablet, Rfl: 3   sildenafil (REVATIO) 20 MG tablet, Take 2-3 tablets po 1-2 hrs before sex, Disp: 24 tablet, Rfl: 5   SYRINGE-NEEDLE, DISP, 3 ML (B-D SYRINGE/NEEDLE 3CC/25GX5/8) 25G X 5/8" 3 ML MISC, 1 each by Does not apply route every 14 (fourteen) days., Disp: 50 each, Rfl: 0   Testosterone Cypionate 200 MG/ML SOLN, Inject 100 mg as directed every 14 (fourteen) days., Disp: 2 mL, Rfl: 1   zolpidem (AMBIEN) 5 MG tablet, Take 1 tablet (5 mg total) by mouth at bedtime as needed for sleep., Disp: 30 tablet, Rfl: 5 Medication Side Effects: none  Family Medical/ Social History: Changes? No  MENTAL HEALTH EXAM:  There were no vitals taken for this visit.There is no height or weight on file to calculate BMI.  General Appearance: Casual, Neat, and Well Groomed  Eye Contact:  Good  Speech:  Clear and Coherent  Volume:  Normal  Mood:  NA  Affect:  Appropriate  Thought Process:  Coherent  Orientation:  Full (Time, Place, and Person)  Thought Content: Logical   Suicidal Thoughts:  No  Homicidal Thoughts:  No  Memory:  WNL  Judgement:  Good  Insight:  Good  Psychomotor Activity:  Normal  Concentration:  Concentration: Good  Recall:  Good  Fund of Knowledge: Good  Language: Good  Assets:  Desire for Improvement  ADL's:  Intact  Cognition: WNL  Prognosis:  Good    DIAGNOSES:    ICD-10-CM   1. Generalized anxiety disorder  F41.1 gabapentin (NEURONTIN) 300 MG capsule    2. Major depressive disorder, recurrent episode, moderate (HCC)  F33.1 gabapentin (NEURONTIN) 300 MG  capsule    3. Panic disorder  F41.0 gabapentin (NEURONTIN) 300 MG capsule    4. Insomnia, unspecified type  G47.00 gabapentin (NEURONTIN) 300 MG capsule    mirtazapine (REMERON) 30 MG tablet    5. Tardive dyskinesia  G24.01     6. Benzodiazepine dependence (HCC)  F13.20       Receiving Psychotherapy: No    RECOMMENDATIONS:   Continue on current medication regimen Will reduce Gabapentin from 400 mg 3 times daily to 300 mg three times daily.  Continue Ativan 0.5 mg three times daily as needed Reduce Risperdal 0.5 mg twice daily to 0.5 mg once daily.  Continue Zoloft 200 mg daily Continue Mirtazapine 30 mg at bedtime.  Continue Ambien 5 mg daily at bedtime Will report and side effects or worsening To follow up in 3 months to reassess anxiety  Greater than 50% of  30 min. face to face time with patient was spent on counseling and coordination of care. We discussed his current state of stability. He feels he is in a good place right now. Recently had testosterone levels checked and he was in the low 200's. He is intent on trying to wean down on some of his meds that may be causing fatigue, primarily Gabapentin and Risperidone.  He most likely has TD as discussed in previous visits. He refused to continue tx for it. He now would like to decrease his Risperidone to see if it helps. I explained to patient that reduction of medications should be done very slowly.  Educated patient on increasing his hydration to consuming half his body weight in OZ. Water daily. Obtain new labs in 5 months.   Reviewed PDMP       Elwanda Brooklyn, NP

## 2022-01-28 ENCOUNTER — Other Ambulatory Visit: Payer: Self-pay | Admitting: Family Medicine

## 2022-01-28 DIAGNOSIS — E782 Mixed hyperlipidemia: Secondary | ICD-10-CM

## 2022-01-28 DIAGNOSIS — I2510 Atherosclerotic heart disease of native coronary artery without angina pectoris: Secondary | ICD-10-CM

## 2022-01-28 DIAGNOSIS — Z9861 Coronary angioplasty status: Secondary | ICD-10-CM

## 2022-02-01 ENCOUNTER — Telehealth: Payer: Self-pay | Admitting: Behavioral Health

## 2022-02-01 ENCOUNTER — Other Ambulatory Visit: Payer: Self-pay | Admitting: Behavioral Health

## 2022-02-01 DIAGNOSIS — F411 Generalized anxiety disorder: Secondary | ICD-10-CM

## 2022-02-01 DIAGNOSIS — F1320 Sedative, hypnotic or anxiolytic dependence, uncomplicated: Secondary | ICD-10-CM

## 2022-02-01 DIAGNOSIS — G4700 Insomnia, unspecified: Secondary | ICD-10-CM

## 2022-02-01 DIAGNOSIS — F331 Major depressive disorder, recurrent, moderate: Secondary | ICD-10-CM

## 2022-02-01 DIAGNOSIS — F410 Panic disorder [episodic paroxysmal anxiety] without agoraphobia: Secondary | ICD-10-CM

## 2022-02-01 MED ORDER — TRAZODONE HCL 50 MG PO TABS: 50.0000 mg | ORAL_TABLET | Freq: Every day | ORAL | 1 refills | Status: DC

## 2022-02-01 MED ORDER — MIRTAZAPINE 7.5 MG PO TABS
7.5000 mg | ORAL_TABLET | Freq: Every day | ORAL | 0 refills | Status: DC
Start: 2022-02-01 — End: 2022-06-28

## 2022-02-01 NOTE — Telephone Encounter (Signed)
Pt is unable to sleep even when taking ambien.He takes it at 8pm,and is not able to fall asleep until 12 and is up a couple hours later

## 2022-02-01 NOTE — Telephone Encounter (Signed)
Took VM message from wife today. Called at 1:36pm, Devin Daugherty is having trouble with sleep. Please call.

## 2022-02-01 NOTE — Telephone Encounter (Signed)
Wife informed

## 2022-02-01 NOTE — Telephone Encounter (Signed)
Tell him, I am going to call in a new script for Mirtazapine 7.5 mg and I am going to add Trazodone 50 mg at bedtime. Sometimes a lower dose of Mirtazapine can cause more sleepiness. Tell him to take this new lower dose of Mirtazapine and then one tablet of Trazodone at bedtime. Call back next week if this does not help. When he takes Arby [place]rette, make sure he is not staying up. All lights out, in the bed with TV off, dark room and no noise. Only take ambien when you are ready to get in the bed with dark room.  Make sure he is practicing good sleep hygiene. Ambien will have opposite effect if you miss your window for sleep and make insomnia worse especially in people older than 65.

## 2022-02-28 ENCOUNTER — Other Ambulatory Visit: Payer: Self-pay | Admitting: Behavioral Health

## 2022-02-28 DIAGNOSIS — F410 Panic disorder [episodic paroxysmal anxiety] without agoraphobia: Secondary | ICD-10-CM

## 2022-02-28 DIAGNOSIS — G4700 Insomnia, unspecified: Secondary | ICD-10-CM

## 2022-02-28 DIAGNOSIS — F331 Major depressive disorder, recurrent, moderate: Secondary | ICD-10-CM

## 2022-02-28 DIAGNOSIS — F411 Generalized anxiety disorder: Secondary | ICD-10-CM

## 2022-03-01 ENCOUNTER — Ambulatory Visit: Payer: Medicare Other | Admitting: Internal Medicine

## 2022-03-02 ENCOUNTER — Ambulatory Visit (INDEPENDENT_AMBULATORY_CARE_PROVIDER_SITE_OTHER): Payer: Medicare Other | Admitting: Behavioral Health

## 2022-03-02 ENCOUNTER — Other Ambulatory Visit: Payer: Self-pay

## 2022-03-02 ENCOUNTER — Encounter: Payer: Self-pay | Admitting: Behavioral Health

## 2022-03-02 DIAGNOSIS — G4700 Insomnia, unspecified: Secondary | ICD-10-CM | POA: Diagnosis not present

## 2022-03-02 DIAGNOSIS — F411 Generalized anxiety disorder: Secondary | ICD-10-CM

## 2022-03-02 DIAGNOSIS — F1320 Sedative, hypnotic or anxiolytic dependence, uncomplicated: Secondary | ICD-10-CM | POA: Diagnosis not present

## 2022-03-02 DIAGNOSIS — F331 Major depressive disorder, recurrent, moderate: Secondary | ICD-10-CM | POA: Diagnosis not present

## 2022-03-02 DIAGNOSIS — F410 Panic disorder [episodic paroxysmal anxiety] without agoraphobia: Secondary | ICD-10-CM

## 2022-03-02 MED ORDER — VILAZODONE HCL 20 MG PO TABS: 20.0000 mg | ORAL_TABLET | Freq: Every day | ORAL | 1 refills | Status: DC

## 2022-03-02 MED ORDER — LORAZEPAM 0.5 MG PO TABS: ORAL_TABLET | ORAL | 5 refills | Status: DC

## 2022-03-02 NOTE — Progress Notes (Signed)
[facility] ? ?Patient ID: Devin Daugherty,  ?MRN: [id number] ? ?PCP: Anabel Halon, MD ? ?Date of Evaluation: 03/02/2022 ?Time spent:30 minutes ? ?Chief Complaint:  ?Chief Complaint   ?Anxiety; Depression; Panic Attack; Medication Problem; Follow-up; Medication Refill; Insomnia ?  ? ? ?HISTORY/CURRENT STATUS: ?HPI ? ?71 year old male presents to this office for follow up and medication management. He says, "I've been doing pretty good. Says his anxiety is still worse in the am. He is still complaining of poor sleep. Says Trazodone worked for about a week and then stopped. He would like to try something different.  He would like to continue to reduce his Gabapentin and Risperidone to see if it help with energy levels. He also feels like he has been on his Zoloft for so long and it is not working anymore. He would like to try another medication for anxiety and depression. His testosterone was in the low 200's. He reports anxiety at 3/10 and depression at 2/10. Says he sleeps 7-8 hours per night. He wants to follow up in 6 weeks.  No mania, no psychosis. No SI/HI.  ?  ?Past psychiatric medication trials: ?Prozac ?Paxil  ?Ingrezza ?Risperdal ?Zoloft ?Ambien ?Mirtazapine-Not really working anymore ?Gabapentin ?Trazodone- Ineffective ?  ? ?Individual Medical History/ Review of Systems: Changes? :No  ? ?Allergies: Codeine ? ?Current Medications:  ?Current Outpatient Medications:  ?  gabapentin (NEURONTIN) 300 MG capsule, TAKE ONE CAPSULE BY MOUTH THREE TIMES DAILY, Disp: 90 capsule, Rfl: 0 ?  Vilazodone HCl 20 MG TABS, Take 1 tablet (20 mg total) by mouth daily., Disp: 30 tablet, Rfl: 1 ?  gabapentin (NEURONTIN) 400 MG capsule, Take 1 capsule (400 mg total) by mouth 3 (three) times daily., Disp: 90 capsule, Rfl: 4 ?  isosorbide mononitrate (IMDUR) 30 MG 24 hr tablet, Take 30 mg by mouth daily., Disp: , Rfl:  ?  lisinopril (ZESTRIL) 40 MG tablet, Take by mouth., Disp: , Rfl:  ?  LORazepam (ATIVAN) 0.5 MG  tablet, One by mouth up to three times daily for anxiety.- per psychiatrist, Disp: 90 tablet, Rfl: 5 ?  metoprolol succinate (TOPROL-XL) 25 MG 24 hr tablet, Take by mouth., Disp: , Rfl:  ?  mirtazapine (REMERON) 30 MG tablet, Take 1 tablet (30 mg total) by mouth at bedtime., Disp: 90 tablet, Rfl: 3 ?  mirtazapine (REMERON) 7.5 MG tablet, Take 1 tablet (7.5 mg total) by mouth at bedtime., Disp: 30 tablet, Rfl: 0 ?  RISPERDAL 0.5 MG tablet, Take 1 tablet (0.5 mg total) by mouth 2 (two) times daily., Disp: 60 tablet, Rfl: 5 ?  rosuvastatin (CRESTOR) 40 MG tablet, Take 1 tablet (40 mg total) by mouth daily., Disp: 90 tablet, Rfl: 3 ?  sertraline (ZOLOFT) 100 MG tablet, TAKE 2 TABLETS BY MOUTH EVERY DAY AS NEEDED, Disp: 180 tablet, Rfl: 3 ?  sildenafil (REVATIO) 20 MG tablet, Take 2-3 tablets po 1-2 hrs before sex, Disp: 24 tablet, Rfl: 5 ?  SYRINGE-NEEDLE, DISP, 3 ML (B-D SYRINGE/NEEDLE 3CC/25GX5/8) 25G X 5/8" 3 ML MISC, 1 each by Does not apply route every 14 (fourteen) days., Disp: 50 each, Rfl: 0 ?  Testosterone Cypionate 200 MG/ML SOLN, Inject 100 mg as directed every 14 (fourteen) days., Disp: 2 mL, Rfl: 1 ?  traZODone (DESYREL) 50 MG tablet, Take 1 tablet (50 mg total) by mouth at bedtime., Disp: 30 tablet, Rfl: 1 ?  zolpidem (AMBIEN) 5 MG tablet, Take 1 tablet (5 mg total) by mouth at bedtime as  needed for sleep., Disp: 30 tablet, Rfl: 5 ?Medication Side Effects: none ? ?Family Medical/ Social History: Changes? No ? ?MENTAL HEALTH EXAM: ? ?There were no vitals taken for this visit.There is no height or weight on file to calculate BMI.  ?General Appearance: Casual, Neat, and Well Groomed  ?Eye Contact:  Good  ?Speech:  Clear and Coherent  ?Volume:  Normal  ?Mood:  NA  ?Affect:  Appropriate  ?Thought Process:  Coherent  ?Orientation:  Full (Time, Place, and Person)  ?Thought Content: Logical   ?Suicidal Thoughts:  No  ?Homicidal Thoughts:  No  ?Memory:  WNL  ?Judgement:  Good  ?Insight:  Good  ?Psychomotor  Activity:  Normal  ?Concentration:  Concentration: Good  ?Recall:  Good  ?Fund of Knowledge: Good  ?Language: Good  ?Assets:  Desire for Improvement  ?ADL's:  Intact  ?Cognition: WNL  ?Prognosis:  Good  ? ? ?DIAGNOSES:  ?  ICD-10-CM   ?1. Generalized anxiety disorder  F41.1 Vilazodone HCl 20 MG TABS  ?  LORazepam (ATIVAN) 0.5 MG tablet  ?  ?2. Major depressive disorder, recurrent episode, moderate (HCC)  F33.1 Vilazodone HCl 20 MG TABS  ?  ?3. Insomnia, unspecified type  G47.00   ?  ?4. Benzodiazepine dependence (HCC)  F13.20   ?  ?5. Panic disorder  F41.0 LORazepam (ATIVAN) 0.5 MG tablet  ?  ? ? ?Receiving Psychotherapy: No  ? ? ?RECOMMENDATIONS:  ? ?To continue Gabapentin 300 mg tablet three times daily (900 mg total). ?Continue Ativan 0.5 mg three times daily as needed ?Reduce Risperdal 0.5 mg to 0.25 mg daily. ?Reduce Zoloft by 50% each week until finished ?To Start Viibryd 10 mg for 7 days, then 20 mg daily ?Continue Mirtazapine 7.5 mg at bedtime.  ?Continue Ambien 5 mg daily at bedtime ?Stopped Trazodone. ?Provided samples #9 Belsomra 20 mg tablet at bedtime ?Will report and side effects or worsening ?To follow up in 6 weeks to reassess  ?Greater than 50% of  30 min. face to face time with patient was spent on counseling and coordination of care. We discussed his current state of stability. He feels he is in a good place right now overall but is still having anxiety that is more severe in the am. We discussed him taking his Ativan first thing in the morning. He feels Zoloft is no longer having an effect. He has been up to 200 mg and is wanting to change medications.  Still complaining of sleeping issues. Recently had testosterone levels checked and he was in the low 200's. He is intent on trying to wean down on some of his meds that may be causing fatigue, primarily Gabapentin and Risperidone.  ?He most likely has TD as discussed in previous visits. This visit his non rhythmic Tremors have improved since  reducing Risperidone. Spouse says his grinding in jaw has stopped.  He does not want to add any medication that treats TD due to fatigue. I explained to patient that reduction of medications should be done very slowly. ?  ?Educated patient on increasing his hydration to consuming half his body weight in OZ. Water daily. Obtain new labs in 5 months.   ?Reviewed PDMP ?  ?  ? ? ? ? ? ? ? ?Joan Flores, NP  ?

## 2022-03-23 ENCOUNTER — Other Ambulatory Visit: Payer: Self-pay | Admitting: Behavioral Health

## 2022-03-23 DIAGNOSIS — F410 Panic disorder [episodic paroxysmal anxiety] without agoraphobia: Secondary | ICD-10-CM

## 2022-03-23 DIAGNOSIS — F411 Generalized anxiety disorder: Secondary | ICD-10-CM

## 2022-03-23 DIAGNOSIS — F331 Major depressive disorder, recurrent, moderate: Secondary | ICD-10-CM

## 2022-03-23 DIAGNOSIS — G4700 Insomnia, unspecified: Secondary | ICD-10-CM

## 2022-04-11 ENCOUNTER — Other Ambulatory Visit: Payer: Self-pay | Admitting: Behavioral Health

## 2022-04-11 DIAGNOSIS — G4700 Insomnia, unspecified: Secondary | ICD-10-CM

## 2022-04-12 NOTE — Telephone Encounter (Signed)
Has apt tomorrow afternoon with [name] ?

## 2022-04-13 ENCOUNTER — Ambulatory Visit (INDEPENDENT_AMBULATORY_CARE_PROVIDER_SITE_OTHER): Payer: Medicare Other | Admitting: Behavioral Health

## 2022-04-13 DIAGNOSIS — F411 Generalized anxiety disorder: Secondary | ICD-10-CM

## 2022-04-13 DIAGNOSIS — F1320 Sedative, hypnotic or anxiolytic dependence, uncomplicated: Secondary | ICD-10-CM | POA: Diagnosis not present

## 2022-04-13 DIAGNOSIS — F410 Panic disorder [episodic paroxysmal anxiety] without agoraphobia: Secondary | ICD-10-CM

## 2022-04-13 DIAGNOSIS — F331 Major depressive disorder, recurrent, moderate: Secondary | ICD-10-CM

## 2022-04-14 ENCOUNTER — Encounter: Payer: Self-pay | Admitting: Behavioral Health

## 2022-04-14 NOTE — Progress Notes (Signed)
[facility] ? ?Patient ID: Devin Daugherty,  ?MRN: [id number] ? ?PCP: Anabel Halon, MD ? ?Date of Evaluation: 04/14/2022 ?Time spent:40 minutes ? ?Chief Complaint:  ?Chief Complaint   ?Anxiety; Depression; Panic Attack; Follow-up; Medication Refill; Medication Problem ?  ? ? ?HISTORY/CURRENT STATUS: ?HPI ?71 year old male presents to this office for follow up and medication management. He says, "I've been doing pretty good. Says his anxiety is still worse in the am. He is still complaining of poor sleep. He was unable to reduce in Risperidone successfully or continue with Viibryd. He would like to get Gene[employer]ight reesutls His testosterone was in the low 200's. He reports anxiety at 3/10 and depression at 2/10. Says he sleeps 7-8 hours per night. He wants to follow up in 6 weeks.  No mania, no psychosis. No SI/HI.  ?  ?Past psychiatric medication trials: ?Prozac ?Paxil  ?Ingrezza ?Risperdal ?Zoloft ?Ambien ?Mirtazapine-Not really working anymore ?Gabapentin ?Trazodone- Ineffective ? ? ? ? ? ?Individual Medical History/ Review of Systems: Changes? :No  ? ?Allergies: Codeine ? ?Current Medications:  ?Current Outpatient Medications:  ?  gabapentin (NEURONTIN) 300 MG capsule, TAKE ONE CAPSULE BY MOUTH THREE TIMES DAILY, Disp: 90 capsule, Rfl: 0 ?  traZODone (DESYREL) 50 MG tablet, TAKE 1 TABLET BY MOUTH AT BEDTIME, Disp: 30 tablet, Rfl: 1 ?  gabapentin (NEURONTIN) 400 MG capsule, Take 1 capsule (400 mg total) by mouth 3 (three) times daily., Disp: 90 capsule, Rfl: 4 ?  isosorbide mononitrate (IMDUR) 30 MG 24 hr tablet, Take 30 mg by mouth daily., Disp: , Rfl:  ?  lisinopril (ZESTRIL) 40 MG tablet, Take by mouth., Disp: , Rfl:  ?  LORazepam (ATIVAN) 0.5 MG tablet, One by mouth up to three times daily for anxiety.- per psychiatrist, Disp: 90 tablet, Rfl: 5 ?  metoprolol succinate (TOPROL-XL) 25 MG 24 hr tablet, Take by mouth., Disp: , Rfl:  ?  mirtazapine (REMERON) 7.5 MG tablet, Take 1 tablet (7.5 mg total)  by mouth at bedtime., Disp: 30 tablet, Rfl: 0 ?  RISPERDAL 0.5 MG tablet, Take 1 tablet (0.5 mg total) by mouth 2 (two) times daily., Disp: 60 tablet, Rfl: 5 ?  rosuvastatin (CRESTOR) 40 MG tablet, Take 1 tablet (40 mg total) by mouth daily., Disp: 90 tablet, Rfl: 3 ?  sertraline (ZOLOFT) 100 MG tablet, TAKE 2 TABLETS BY MOUTH EVERY DAY AS NEEDED, Disp: 180 tablet, Rfl: 3 ?  sildenafil (REVATIO) 20 MG tablet, Take 2-3 tablets po 1-2 hrs before sex, Disp: 24 tablet, Rfl: 5 ?  SYRINGE-NEEDLE, DISP, 3 ML (B-D SYRINGE/NEEDLE 3CC/25GX5/8) 25G X 5/8" 3 ML MISC, 1 each by Does not apply route every 14 (fourteen) days., Disp: 50 each, Rfl: 0 ?  Testosterone Cypionate 200 MG/ML SOLN, Inject 100 mg as directed every 14 (fourteen) days., Disp: 2 mL, Rfl: 1 ?  Vilazodone HCl 20 MG TABS, Take 1 tablet (20 mg total) by mouth daily., Disp: 30 tablet, Rfl: 1 ?  zolpidem (AMBIEN) 5 MG tablet, TAKE 1 TABLET BY MOUTH AT BEDTIME AS NEEDED FOR SLEEP, Disp: 30 tablet, Rfl: 5 ?Medication Side Effects: none ? ?Family Medical/ Social History: Changes? No ? ?MENTAL HEALTH EXAM: ? ?There were no vitals taken for this visit.There is no height or weight on file to calculate BMI.  ?General Appearance: Casual and Neat  ?Eye Contact:  Good  ?Speech:  Clear and Coherent  ?Volume:  Normal  ?Mood:  Anxious  ?Affect:  Anxious  ?Thought Process:  Coherent  ?  Orientation:  Full (Time, Place, and Person)  ?Thought Content: Logical   ?Suicidal Thoughts:  No  ?Homicidal Thoughts:  No  ?Memory:  WNL  ?Judgement:  Fair  ?Insight:  Fair  ?Psychomotor Activity:  Normal  ?Concentration:  Concentration: Good  ?Recall:  Good  ?Fund of Knowledge: Fair  ?Language: Good  ?Assets:  Desire for Improvement  ?ADL's:  Intact  ?Cognition: WNL  ?Prognosis:  Good  ? ? ?DIAGNOSES:  ?  ICD-10-CM   ?1. Generalized anxiety disorder  F41.1   ?  ?2. Major depressive disorder, recurrent episode, moderate (HCC)  F33.1   ?  ?3. Benzodiazepine dependence (HCC)  F13.20   ?  ?4. Panic  disorder  F41.0   ?  ? ? ?Receiving Psychotherapy: No  ? ? ?RECOMMENDATIONS:  ? ?Greater than 50% of  30 min. face to face time with patient was spent on counseling and coordination of care. We discussed his current state of stability. He feels he is in a good place right now overall but is still having anxiety that is more severe in the am. We discussed him taking his Ativan first thing in the morning Educated pt importance of contacting me if he wants to make changes with is medication for safety and charting is completed accurately.  Was not successful on weaning off the Risperidone. Went back to taking 1 mg daily.  ?He most likely has TD as discussed in previous visits but does not want to take medications for tx. When we reduced his Riperidone to 0.5 symptoms reduced by 50 %.  He does not want to add any medication that treats TD due to fatigue.  ?Educated patient on increasing his hydration to consuming half his body weight in OZ. Water daily. Obtain new labs in 5 months.  ?To continue Gabapentin 300 mg tablet three times daily (900 mg total). ?Continue Ativan 0.5 mg three times daily as needed ?Pt increased  Risperdal 0.5 mg to 1 mg daily without notifying office. ?Continued the Zoloft 200 mg daily ?Pt stopped Viibryd 20 mg without notifying this office. [place] Spaniel that it made him very irritable and agitated so he quit.  ?Continue Mirtazapine 7.5 mg at bedtime.  ?Continue Ambien 5 mg daily at bedtime ?Continue Trazodone 50 mg at bedtime.  ?Provided samples #9 Belsomra 20 mg tablet at bedtime. Pt said they were not effective, continued Trazodone. ?Will report and side effects or worsening ?Discussed potential metabolic side effects associated with atypical antipsychotics, as well as potential risk for movement side effects. Advised pt to contact office if movement side effects occur.   ?To follow up in 3 weeks to reassess  ? ?We completed Gene[facility]ight testing this visit and pt would like to get results back before  adjustment of medications any further.  ? ?  ?Reviewed PDMP ?  ?  ?  ?  ?  ? ? ? ? ? ?Joan Flores, NP  ?

## 2022-04-16 DIAGNOSIS — E782 Mixed hyperlipidemia: Secondary | ICD-10-CM | POA: Diagnosis not present

## 2022-04-16 DIAGNOSIS — I10 Essential (primary) hypertension: Secondary | ICD-10-CM | POA: Diagnosis not present

## 2022-04-16 DIAGNOSIS — Z955 Presence of coronary angioplasty implant and graft: Secondary | ICD-10-CM | POA: Diagnosis not present

## 2022-04-16 DIAGNOSIS — I2510 Atherosclerotic heart disease of native coronary artery without angina pectoris: Secondary | ICD-10-CM | POA: Diagnosis not present

## 2022-04-20 DIAGNOSIS — I10 Essential (primary) hypertension: Secondary | ICD-10-CM | POA: Diagnosis not present

## 2022-04-20 DIAGNOSIS — M1711 Unilateral primary osteoarthritis, right knee: Secondary | ICD-10-CM | POA: Diagnosis not present

## 2022-04-20 DIAGNOSIS — G8929 Other chronic pain: Secondary | ICD-10-CM | POA: Diagnosis not present

## 2022-04-20 DIAGNOSIS — M25561 Pain in right knee: Secondary | ICD-10-CM | POA: Diagnosis not present

## 2022-04-22 ENCOUNTER — Other Ambulatory Visit: Payer: Self-pay | Admitting: Behavioral Health

## 2022-04-22 DIAGNOSIS — G4700 Insomnia, unspecified: Secondary | ICD-10-CM

## 2022-04-22 DIAGNOSIS — F410 Panic disorder [episodic paroxysmal anxiety] without agoraphobia: Secondary | ICD-10-CM

## 2022-04-22 DIAGNOSIS — F331 Major depressive disorder, recurrent, moderate: Secondary | ICD-10-CM

## 2022-04-22 DIAGNOSIS — F411 Generalized anxiety disorder: Secondary | ICD-10-CM

## 2022-04-23 DIAGNOSIS — M1711 Unilateral primary osteoarthritis, right knee: Secondary | ICD-10-CM | POA: Insufficient documentation

## 2022-04-24 ENCOUNTER — Emergency Department (HOSPITAL_COMMUNITY)
Admission: EM | Admit: 2022-04-24 | Discharge: 2022-04-24 | Disposition: A | Discharge status: Home / Self Care | Payer: Medicare Other | Attending: Emergency Medicine | Admitting: Emergency Medicine

## 2022-04-24 ENCOUNTER — Other Ambulatory Visit: Payer: Self-pay

## 2022-04-24 ENCOUNTER — Emergency Department (HOSPITAL_COMMUNITY): Payer: Medicare Other

## 2022-04-24 ENCOUNTER — Encounter (HOSPITAL_COMMUNITY): Payer: Self-pay

## 2022-04-24 DIAGNOSIS — R0789 Other chest pain: Secondary | ICD-10-CM | POA: Diagnosis not present

## 2022-04-24 DIAGNOSIS — I10 Essential (primary) hypertension: Secondary | ICD-10-CM | POA: Insufficient documentation

## 2022-04-24 DIAGNOSIS — Z79899 Other long term (current) drug therapy: Secondary | ICD-10-CM | POA: Insufficient documentation

## 2022-04-24 DIAGNOSIS — R0689 Other abnormalities of breathing: Secondary | ICD-10-CM | POA: Diagnosis not present

## 2022-04-24 DIAGNOSIS — R079 Chest pain, unspecified: Secondary | ICD-10-CM

## 2022-04-24 DIAGNOSIS — R072 Precordial pain: Secondary | ICD-10-CM | POA: Diagnosis present

## 2022-04-24 LAB — CBC
HCT: 42.8 % (ref 39.0–52.0)
Hemoglobin: 14.5 g/dL (ref 13.0–17.0)
MCH: 29.9 pg (ref 26.0–34.0)
MCHC: 33.9 g/dL (ref 30.0–36.0)
MCV: 88.2 fL (ref 80.0–100.0)
Platelets: 299 10*3/uL (ref 150–400)
RBC: 4.85 MIL/uL (ref 4.22–5.81)
RDW: 13.3 % (ref 11.5–15.5)
WBC: 12.8 10*3/uL — ABNORMAL HIGH (ref 4.0–10.5)
nRBC: 0 % (ref 0.0–0.2)

## 2022-04-24 LAB — BASIC METABOLIC PANEL
Anion gap: 7 (ref 5–15)
BUN: 22 mg/dL (ref 8–23)
CO2: 22 mmol/L (ref 22–32)
Calcium: 9.1 mg/dL (ref 8.9–10.3)
Chloride: 108 mmol/L (ref 98–111)
Creatinine, Ser: 1.57 mg/dL — ABNORMAL HIGH (ref 0.61–1.24)
GFR, Estimated: 47 mL/min — ABNORMAL LOW (ref >60)
Glucose, Bld: 102 mg/dL — ABNORMAL HIGH (ref 70–99)
Potassium: 4.1 mmol/L (ref 3.5–5.1)
Sodium: 137 mmol/L (ref 135–145)

## 2022-04-24 LAB — TROPONIN I (HIGH SENSITIVITY)
Troponin I (High Sensitivity): 5 ng/L (ref <18)
Troponin I (High Sensitivity): 5 ng/L (ref <18)

## 2022-04-24 MED ORDER — HYDRALAZINE HCL 20 MG/ML IJ SOLN
10.0000 mg | Freq: Once | INTRAMUSCULAR | Status: AC
  Administered 2022-04-24: 10 mg via INTRAVENOUS
  Filled 2022-04-24: qty 1

## 2022-04-24 MED ORDER — LORAZEPAM 1 MG PO TABS
1.0000 mg | ORAL_TABLET | Freq: Once | ORAL | Status: AC
  Administered 2022-04-24: 1 mg via ORAL
  Filled 2022-04-24: qty 1

## 2022-04-24 NOTE — ED Provider Notes (Signed)
?[facility] ?Provider Note ? ? ?CSN: [id number] ?Arrival date & time: 04/24/22  0143 ? ?  ? ?History ? ?Chief Complaint  ?Patient presents with  ? Chest Pain  ? ? ?Devin Daugherty is a 71 y.o. male. ? ?Patient presents to the emergency department for evaluation of chest pain.  Patient reports that he had an episode at 3 PM yesterday that lasted about 30 minutes and then resolved.  He reports that the pain came back around 10:30 PM tonight.  Pain is substernal, does not radiate.  No nausea, diaphoresis, shortness of breath.  Patient comes to the ER by ambulance.  By the time he arrives in the ED, pain has now resolved again.  Patient reports that he recently saw his cardiologist and had his blood pressure medications adjusted for persistent hypertension.  He is very concerned that his blood pressure is still elevated tonight. ? ? ?  ? ?Home Medications ?Prior to Admission medications   ?Medication Sig Start Date End Date Taking? Authorizing Provider  ?traZODone (DESYREL) 50 MG tablet TAKE 1 TABLET BY MOUTH AT BEDTIME 03/25/22   Lesle Chris A, NP  ?gabapentin (NEURONTIN) 300 MG capsule TAKE ONE CAPSULE BY MOUTH THREE TIMES DAILY 04/22/22   Elwanda Brooklyn, NP  ?gabapentin (NEURONTIN) 400 MG capsule Take 1 capsule (400 mg total) by mouth 3 (three) times daily. 10/15/21   Elwanda Brooklyn, NP  ?isosorbide mononitrate (IMDUR) 30 MG 24 hr tablet Take 30 mg by mouth daily.    [provider]  ?lisinopril (ZESTRIL) 40 MG tablet Take by mouth. 03/27/20   [provider]  ?LORazepam (ATIVAN) 0.5 MG tablet One by mouth up to three times daily for anxiety.- per psychiatrist 03/02/22   Elwanda Brooklyn, NP  ?metoprolol succinate (TOPROL-XL) 25 MG 24 hr tablet Take by mouth. 09/07/18   [provider]  ?mirtazapine (REMERON) 7.5 MG tablet Take 1 tablet (7.5 mg total) by mouth at bedtime. 02/01/22   Elwanda Brooklyn, NP  ?RISPERDAL 0.5 MG tablet Take 1 tablet (0.5 mg total) by mouth 2 (two) times  daily. 10/15/21   Elwanda Brooklyn, NP  ?rosuvastatin (CRESTOR) 40 MG tablet Take 1 tablet (40 mg total) by mouth daily. 08/26/21   Lindell Spar, MD  ?sertraline (ZOLOFT) 100 MG tablet TAKE 2 TABLETS BY MOUTH EVERY DAY AS NEEDED 10/15/21   Elwanda Brooklyn, NP  ?sildenafil (REVATIO) 20 MG tablet Take 2-3 tablets po 1-2 hrs before sex 08/28/20   Elvia Collum M, DO  ?SYRINGE-NEEDLE, DISP, 3 ML (B-D SYRINGE/NEEDLE 3CC/25GX5/8) 25G X 5/8" 3 ML MISC 1 each by Does not apply route every 14 (fourteen) days. 12/02/21   Lindell Spar, MD  ?Testosterone Cypionate 200 MG/ML SOLN Inject 100 mg as directed every 14 (fourteen) days. 12/01/21   Lindell Spar, MD  ?Vilazodone HCl 20 MG TABS TAKE 1 TABLET BY MOUTH EVERY DAY 04/22/22   Elwanda Brooklyn, NP  ?zolpidem (AMBIEN) 5 MG tablet TAKE 1 TABLET BY MOUTH AT BEDTIME AS NEEDED FOR SLEEP 04/13/22   Elwanda Brooklyn, NP  ?   ? ?Allergies    ?Codeine   ? ?Review of Systems   ?Review of Systems ? ?Physical Exam ?Updated Vital Signs ?BP (!) 163/89   Pulse (!) 54   Temp 98.2 ?F (36.8 ?C) (Oral)   Resp 17   Ht 5\' 8"  (1.727 m)   Wt 102.5 kg   SpO2 97%   BMI  34.36 kg/m?  ?Physical Exam ?Vitals and nursing note reviewed.  ?Constitutional:   ?   General: He is not in acute distress. ?   Appearance: He is well-developed.  ?HENT:  ?   Head: Normocephalic and atraumatic.  ?   Mouth/Throat:  ?   Mouth: Mucous membranes are moist.  ?Eyes:  ?   General: Vision grossly intact. Gaze aligned appropriately.  ?   Extraocular Movements: Extraocular movements intact.  ?   Conjunctiva/sclera: Conjunctivae normal.  ?Cardiovascular:  ?   Rate and Rhythm: Normal rate and regular rhythm.  ?   Pulses: Normal pulses.  ?   Heart sounds: Normal heart sounds, S1 normal and S2 normal. No murmur heard. ?  No friction rub. No gallop.  ?Pulmonary:  ?   Effort: Pulmonary effort is normal. No respiratory distress.  ?   Breath sounds: Normal breath sounds.  ?Abdominal:  ?   Palpations: Abdomen is soft.  ?   Tenderness:  There is no abdominal tenderness. There is no guarding or rebound.  ?   Hernia: No hernia is present.  ?Musculoskeletal:     ?   General: No swelling.  ?   Cervical back: Full passive range of motion without pain, normal range of motion and neck supple. No pain with movement, spinous process tenderness or muscular tenderness. Normal range of motion.  ?   Right lower leg: No edema.  ?   Left lower leg: No edema.  ?Skin: ?   General: Skin is warm and dry.  ?   Capillary Refill: Capillary refill takes less than 2 seconds.  ?   Findings: No ecchymosis, erythema, lesion or wound.  ?Neurological:  ?   Mental Status: He is alert and oriented to person, place, and time.  ?   GCS: GCS eye subscore is 4. GCS verbal subscore is 5. GCS motor subscore is 6.  ?   Cranial Nerves: Cranial nerves 2-12 are intact.  ?   Sensory: Sensation is intact.  ?   Motor: Motor function is intact. No weakness or abnormal muscle tone.  ?   Coordination: Coordination is intact.  ?Psychiatric:     ?   Mood and Affect: Mood normal.     ?   Speech: Speech normal.     ?   Behavior: Behavior normal.  ? ? ?ED Results / Procedures / Treatments   ?Labs ?(all labs ordered are listed, but only abnormal results are displayed) ?Labs Reviewed  ?BASIC METABOLIC PANEL - Abnormal; Notable for the following components:  ?    Result Value  ? Glucose, Bld 102 (*)   ? Creatinine, Ser 1.57 (*)   ? GFR, Estimated 47 (*)   ? All other components within normal limits  ?CBC - Abnormal; Notable for the following components:  ? WBC 12.8 (*)   ? All other components within normal limits  ?TROPONIN I (HIGH SENSITIVITY)  ?TROPONIN I (HIGH SENSITIVITY)  ? ? ?EKG ?EKG Interpretation ? ?Date/Time:  Saturday April 24 2022 01:56:43 EDT ?Ventricular Rate:  57 ?PR Interval:  171 ?QRS Duration: 108 ?QT Interval:  403 ?QTC Calculation: 393 ?R Axis:   33 ?Text Interpretation: Sinus rhythm No significant change since last tracing Confirmed by Orpah Greek [id number]) on 04/24/2022  2:04:46 AM ? ?Radiology ?DG Chest 2 View ? ?Result Date: 04/24/2022 ?CLINICAL DATA:  Chest pain for several hours, initial encounter EXAM: CHEST - 2 VIEW COMPARISON:  None. FINDINGS: The heart size and mediastinal contours  are within normal limits. Both lungs are clear. The visualized skeletal structures are unremarkable. IMPRESSION: No active cardiopulmonary disease. Electronically Signed   By: Inez Catalina M.D.   On: 04/24/2022 02:39   ? ?Procedures ?Procedures  ? ? ?Medications Ordered in ED ?Medications - No data to display ? ?ED Course/ Medical Decision Making/ A&P ?  ?                        ?Medical Decision Making ?Amount and/or Complexity of Data Reviewed ?Labs: ordered. ?Radiology: ordered. ? ? ?Patient presents to the emergency department for evaluation of chest pain.  Patient has been having chest pain for a couple of hours before arrival, however pain resolved prior to my evaluation.  He has been pain-free since.  Patient very anxious at arrival.  Some of his anxiety is secondary to his elevated blood pressure and concern over not responding to recent medication changes.  This has slowly improved here without my intervention.  Patient's cardiac work-up is negative.  Patient reassured, does not require further work-up today, can follow-up with his doctor to have blood pressure recheck. ? ? ? ? ? ? ? ?Final Clinical Impression(s) / ED Diagnoses ?Final diagnoses:  ?Primary hypertension  ?Chest pain, unspecified type  ? ? ?Rx / DC Orders ?ED Discharge Orders   ? ? None  ? ?  ? ? ?  ?Orpah Greek, MD ?04/24/22 [phone] ? ?

## 2022-04-24 NOTE — ED Triage Notes (Signed)
Pt brought in by EMS for chest pain that started yesterday around 3 pm localized to sternum with no radiation or other symptoms. Pain lasted for about 30 minutes, then went away, pt ate dinner around 8:30 pm then went to lay down around 10:30 pm and chest pain came back, lasting around 1 -1/2 hours until EMS arrived. EMS reports pt was pain free when they put him on stretcher. Pt says his BP meds were changed last week at cardiologist and BP hasn't come down yet and he has been stressing over it. . Reassurance given. BP has decreased since talking with pt ?

## 2022-04-27 DIAGNOSIS — I2510 Atherosclerotic heart disease of native coronary artery without angina pectoris: Secondary | ICD-10-CM | POA: Diagnosis not present

## 2022-04-27 DIAGNOSIS — R079 Chest pain, unspecified: Secondary | ICD-10-CM | POA: Diagnosis not present

## 2022-04-27 DIAGNOSIS — E785 Hyperlipidemia, unspecified: Secondary | ICD-10-CM | POA: Diagnosis not present

## 2022-04-27 DIAGNOSIS — I10 Essential (primary) hypertension: Secondary | ICD-10-CM | POA: Diagnosis not present

## 2022-04-28 ENCOUNTER — Encounter: Payer: Self-pay | Admitting: Behavioral Health

## 2022-04-28 ENCOUNTER — Ambulatory Visit: Payer: Medicare Other | Admitting: Internal Medicine

## 2022-05-03 ENCOUNTER — Ambulatory Visit (INDEPENDENT_AMBULATORY_CARE_PROVIDER_SITE_OTHER): Payer: Medicare Other | Admitting: Behavioral Health

## 2022-05-03 DIAGNOSIS — F1320 Sedative, hypnotic or anxiolytic dependence, uncomplicated: Secondary | ICD-10-CM | POA: Diagnosis not present

## 2022-05-03 DIAGNOSIS — F410 Panic disorder [episodic paroxysmal anxiety] without agoraphobia: Secondary | ICD-10-CM | POA: Diagnosis not present

## 2022-05-03 DIAGNOSIS — G4700 Insomnia, unspecified: Secondary | ICD-10-CM

## 2022-05-03 DIAGNOSIS — F411 Generalized anxiety disorder: Secondary | ICD-10-CM

## 2022-05-03 DIAGNOSIS — F331 Major depressive disorder, recurrent, moderate: Secondary | ICD-10-CM | POA: Diagnosis not present

## 2022-05-04 ENCOUNTER — Encounter: Payer: Self-pay | Admitting: Behavioral Health

## 2022-05-04 DIAGNOSIS — E782 Mixed hyperlipidemia: Secondary | ICD-10-CM | POA: Diagnosis not present

## 2022-05-04 DIAGNOSIS — I2510 Atherosclerotic heart disease of native coronary artery without angina pectoris: Secondary | ICD-10-CM | POA: Diagnosis not present

## 2022-05-04 DIAGNOSIS — I10 Essential (primary) hypertension: Secondary | ICD-10-CM | POA: Diagnosis not present

## 2022-05-04 MED ORDER — OLANZAPINE 5 MG PO TABS: ORAL_TABLET | ORAL | 1 refills | Status: DC

## 2022-05-04 NOTE — Progress Notes (Signed)
[facility] Med Check ? ?Patient ID: Devin Daugherty,  ?MRN: [id number] ? ?PCP: Anabel Halon, MD ? ?Date of Evaluation: 05/04/2022 ?Time spent:45 minutes ? ?Chief Complaint:  ?Chief Complaint   ?Anxiety; Depression; Follow-up; Medication Problem; Panic Attack; Stress ?  ? ? ?HISTORY/CURRENT STATUS: ?HPI ? ?71 year old male presents to this office for follow up and medication management. His wife is present during interview with his consent. She is concerned his demeanor is lethargy and acts like he is sedated all the time. He says nothing works to help his anxiety and "something has to be done". He recently went to ER for hypertensive crisis with BP above 200.  Says it has normalized now. He says this has been one of the worst months since last visit. Says his anxiety has been severe. He believes Ativan has been only thing effective.  He is still complaining of poor sleep. He reports anxiety at 9/10 and depression at 5/10. Says he sleeps 6-7 hours per night. He wants to follow up in 4 weeks.  No mania, no psychosis. No SI/HI.  ?  ?Past psychiatric medication trials: ?Prozac ?Paxil  ?Ingrezza ?Risperdal ?Zoloft ?Ambien ?Mirtazapine-Not really working anymore ?Gabapentin ?Trazodone- Ineffective ?Viibryd-anger, irritability ?  ? ?Individual Medical History/ Review of Systems: Changes? :No  ? ?Allergies: Codeine ? ?Current Medications:  ?Current Outpatient Medications:  ?  OLANZapine (ZYPREXA) 5 MG tablet, Take 1/2 tablet 2.5 mg for 7 days, then take one whole tablet 5 mg total daily., Disp: 30 tablet, Rfl: 1 ?  traZODone (DESYREL) 50 MG tablet, TAKE 1 TABLET BY MOUTH AT BEDTIME, Disp: 30 tablet, Rfl: 1 ?  gabapentin (NEURONTIN) 300 MG capsule, TAKE ONE CAPSULE BY MOUTH THREE TIMES DAILY, Disp: 90 capsule, Rfl: 0 ?  gabapentin (NEURONTIN) 400 MG capsule, Take 1 capsule (400 mg total) by mouth 3 (three) times daily., Disp: 90 capsule, Rfl: 4 ?  isosorbide mononitrate (IMDUR) 30 MG 24 hr tablet, Take 30 mg by mouth  daily., Disp: , Rfl:  ?  lisinopril (ZESTRIL) 40 MG tablet, Take by mouth., Disp: , Rfl:  ?  LORazepam (ATIVAN) 0.5 MG tablet, One by mouth up to three times daily for anxiety.- per psychiatrist, Disp: 90 tablet, Rfl: 5 ?  metoprolol succinate (TOPROL-XL) 25 MG 24 hr tablet, Take by mouth., Disp: , Rfl:  ?  mirtazapine (REMERON) 7.5 MG tablet, Take 1 tablet (7.5 mg total) by mouth at bedtime., Disp: 30 tablet, Rfl: 0 ?  RISPERDAL 0.5 MG tablet, Take 1 tablet (0.5 mg total) by mouth 2 (two) times daily., Disp: 60 tablet, Rfl: 5 ?  rosuvastatin (CRESTOR) 40 MG tablet, Take 1 tablet (40 mg total) by mouth daily., Disp: 90 tablet, Rfl: 3 ?  sertraline (ZOLOFT) 100 MG tablet, TAKE 2 TABLETS BY MOUTH EVERY DAY AS NEEDED, Disp: 180 tablet, Rfl: 3 ?  sildenafil (REVATIO) 20 MG tablet, Take 2-3 tablets po 1-2 hrs before sex, Disp: 24 tablet, Rfl: 5 ?  SYRINGE-NEEDLE, DISP, 3 ML (B-D SYRINGE/NEEDLE 3CC/25GX5/8) 25G X 5/8" 3 ML MISC, 1 each by Does not apply route every 14 (fourteen) days., Disp: 50 each, Rfl: 0 ?  Testosterone Cypionate 200 MG/ML SOLN, Inject 100 mg as directed every 14 (fourteen) days., Disp: 2 mL, Rfl: 1 ?  Vilazodone HCl 20 MG TABS, TAKE 1 TABLET BY MOUTH EVERY DAY, Disp: 30 tablet, Rfl: 1 ?  zolpidem (AMBIEN) 5 MG tablet, TAKE 1 TABLET BY MOUTH AT BEDTIME AS NEEDED FOR SLEEP, Disp: 30 tablet,  Rfl: 5 ?Medication Side Effects: none ? ?Family Medical/ Social History: Changes? No ? ?MENTAL HEALTH EXAM: ? ?There were no vitals taken for this visit.There is no height or weight on file to calculate BMI.  ?General Appearance: Casual  ?Eye Contact:  Good  ?Speech:  Clear and Coherent  ?Volume:  Normal  ?Mood:  Anxious, Depressed, and Irritable  ?Affect:  Congruent, Depressed, Flat, and Anxious  ?Thought Process:  Coherent  ?Orientation:  Full (Time, Place, and Person)  ?Thought Content: Logical   ?Suicidal Thoughts:  No  ?Homicidal Thoughts:  No  ?Memory:  WNL  ?Judgement:  Good  ?Insight:  Good  ?Psychomotor  Activity:  Normal and EPS  ?Concentration:  Concentration: Good  ?Recall:  Good  ?Fund of Knowledge: Good  ?Language: Good  ?Assets:  Desire for Improvement  ?ADL's:  Intact  ?Cognition: WNL  ?Prognosis:  Good  ? ? ?DIAGNOSES:  ?  ICD-10-CM   ?1. Generalized anxiety disorder  F41.1 OLANZapine (ZYPREXA) 5 MG tablet  ?  ?2. Major depressive disorder, recurrent episode, moderate (HCC)  F33.1 OLANZapine (ZYPREXA) 5 MG tablet  ?  ?3. Benzodiazepine dependence (HCC)  F13.20   ?  ?4. Panic disorder  F41.0   ?  ?5. Insomnia, unspecified type  G47.00   ?  ?6. Anxiety state  F41.1 OLANZapine (ZYPREXA) 5 MG tablet  ?  ? ? ?Receiving Psychotherapy: No  ? ? ?RECOMMENDATIONS:  ? ?Greater than 50% of 45 min. face to face time with patient was spent on counseling and coordination of care. We discussed his current state of stability. We discussed his recent ER visit for hypertension. Report BP was above 200 but has since returned to normal. He is very frustrated today because he says his anxiety has been horrible and Ativan is only thing that works for him. He questions what else can be done.  I consulted with Dr. Meredith Staggers about not being able to adequately control patients continued severe anxiety after trying multiple medications without success. His wife is concerned that he is lethargic and acts like he is on drugs now. I reinforced that to keep increasing his Ativan was not the best long term solution.  ?He is still aware of TD symptoms but did not want to try to reinitiate Ingrezza at this time. We reviewed his GENESIGHT results today.  ?We decided to; ?Stop Risperidone ?To start Olanzapine 2.5 mg for one week, then increase to 5 mg daily. ?Educated patient on increasing his hydration to consuming half his body weight in OZ. Water daily. Obtain new labs in 5 months.  ?To continue Gabapentin 300 mg tablet three times daily (900 mg total). ?Continue Ativan 0.5 mg three times daily as needed ?Continued the Zoloft 100 mg  daily ?Stop Mirtazapine 7.5 mg at bedtime.  ?Continue Ambien 5 mg daily at bedtime ?Stop Trazodone 50 mg at bedtime.  ?Will report and side effects or worsening ?Discussed potential metabolic side effects associated with atypical antipsychotics, as well as potential risk for movement side effects. Advised pt to contact office if movement side effects occur.   ?To follow up in 3 weeks to reassess  ?Reviewed PDMP ?  ? ?  ?  ?Reviewed PDMP ? ? ? ? ? ? ?Joan Flores, NP  ?

## 2022-05-07 ENCOUNTER — Telehealth: Payer: Self-pay | Admitting: Behavioral Health

## 2022-05-07 NOTE — Telephone Encounter (Signed)
Devin Daugherty with [facility] [employer] called re: pre-authorization for Devin Daugherty. Approval for Olanzapine 5 mg has been approved from 05/06/22 thru 05/07/23. Devin Daugherty has been made aware of approval. If any questions call them at [phone], option 5 ?

## 2022-05-09 DIAGNOSIS — R9431 Abnormal electrocardiogram [ECG] [EKG]: Secondary | ICD-10-CM | POA: Diagnosis not present

## 2022-05-09 DIAGNOSIS — E782 Mixed hyperlipidemia: Secondary | ICD-10-CM | POA: Diagnosis not present

## 2022-05-09 DIAGNOSIS — Z7982 Long term (current) use of aspirin: Secondary | ICD-10-CM | POA: Diagnosis not present

## 2022-05-09 DIAGNOSIS — Z79899 Other long term (current) drug therapy: Secondary | ICD-10-CM | POA: Diagnosis not present

## 2022-05-09 DIAGNOSIS — K469 Unspecified abdominal hernia without obstruction or gangrene: Secondary | ICD-10-CM | POA: Diagnosis not present

## 2022-05-09 DIAGNOSIS — R079 Chest pain, unspecified: Secondary | ICD-10-CM | POA: Diagnosis not present

## 2022-05-09 DIAGNOSIS — I2510 Atherosclerotic heart disease of native coronary artery without angina pectoris: Secondary | ICD-10-CM | POA: Diagnosis not present

## 2022-05-09 DIAGNOSIS — K219 Gastro-esophageal reflux disease without esophagitis: Secondary | ICD-10-CM | POA: Diagnosis not present

## 2022-05-09 DIAGNOSIS — Z885 Allergy status to narcotic agent status: Secondary | ICD-10-CM | POA: Diagnosis not present

## 2022-05-09 DIAGNOSIS — R0789 Other chest pain: Secondary | ICD-10-CM | POA: Diagnosis not present

## 2022-05-09 DIAGNOSIS — E871 Hypo-osmolality and hyponatremia: Secondary | ICD-10-CM | POA: Insufficient documentation

## 2022-05-09 DIAGNOSIS — Z95818 Presence of other cardiac implants and grafts: Secondary | ICD-10-CM | POA: Diagnosis not present

## 2022-05-09 DIAGNOSIS — G629 Polyneuropathy, unspecified: Secondary | ICD-10-CM | POA: Diagnosis not present

## 2022-05-10 DIAGNOSIS — E871 Hypo-osmolality and hyponatremia: Secondary | ICD-10-CM | POA: Diagnosis not present

## 2022-05-12 ENCOUNTER — Telehealth: Payer: Self-pay

## 2022-05-12 ENCOUNTER — Telehealth: Payer: Self-pay | Admitting: *Deleted

## 2022-05-12 NOTE — Telephone Encounter (Signed)
Transition Care Management Unsuccessful Follow-up Telephone Call ? ?Date of discharge and from where:  05-10-22 [facility] ? ?Attempts:  1st Attempt ? ?Reason for unsuccessful TCM follow-up call:  Unable to leave message ? ? ? ?

## 2022-05-12 NOTE — Telephone Encounter (Signed)
Prior Authorization submitted and approved for OLANZAPINE 5 MG effective 05/06/2022-05/07/2023 with [employer] [place] [employer]  ?

## 2022-05-13 NOTE — Telephone Encounter (Signed)
Noted thank you

## 2022-05-14 DIAGNOSIS — M1711 Unilateral primary osteoarthritis, right knee: Secondary | ICD-10-CM | POA: Diagnosis not present

## 2022-05-17 ENCOUNTER — Other Ambulatory Visit: Payer: Self-pay | Admitting: Behavioral Health

## 2022-05-17 ENCOUNTER — Ambulatory Visit: Payer: Medicare Other | Admitting: Internal Medicine

## 2022-05-17 ENCOUNTER — Telehealth: Payer: Self-pay | Admitting: Behavioral Health

## 2022-05-17 DIAGNOSIS — F411 Generalized anxiety disorder: Secondary | ICD-10-CM

## 2022-05-17 DIAGNOSIS — F410 Panic disorder [episodic paroxysmal anxiety] without agoraphobia: Secondary | ICD-10-CM

## 2022-05-17 DIAGNOSIS — F331 Major depressive disorder, recurrent, moderate: Secondary | ICD-10-CM

## 2022-05-17 DIAGNOSIS — F1320 Sedative, hypnotic or anxiolytic dependence, uncomplicated: Secondary | ICD-10-CM

## 2022-05-17 DIAGNOSIS — G4700 Insomnia, unspecified: Secondary | ICD-10-CM

## 2022-05-17 MED ORDER — OLANZAPINE 10 MG PO TABS: 10.0000 mg | ORAL_TABLET | Freq: Every day | ORAL | 1 refills | Status: DC

## 2022-05-17 NOTE — Telephone Encounter (Signed)
Please have the pt increase the 5 mg tablet by taking two tablets 10 mg total daily until they run out. I sent new script in for 10 mg tablets.

## 2022-05-17 NOTE — Telephone Encounter (Signed)
Pt's wife LVM at 9:10a.  She is on the [facility].  She said the Olanzapine is working some.  Aaron Edelman started him on half a pill and increased it to 1 pill.  Aaron Edelman told them they could increase it more, so they are wanting to do that.  Pls let them know what they can increase it to.  Next appt 6/5

## 2022-05-17 NOTE — Telephone Encounter (Signed)
Called and spoke to wife with patient in the background telling her what to say. He says that he feels like the Zyprexa is trying to kick in, but he is having ups and downs, and wonders if a higher dose would be beneficial. Wife said she thought you told them he could increase the dose. Wife said he is not having depression but his anxiety is severe. She said he still has his PJs on, not able to do anything. Denies SI.

## 2022-05-17 NOTE — Telephone Encounter (Signed)
Notified wife of recommendations

## 2022-05-17 NOTE — Telephone Encounter (Signed)
There is nothing really stronger. He will have to allow this new dosage of zyprexa 10 mg to work

## 2022-05-17 NOTE — Telephone Encounter (Signed)
I called patient with info. He asked about something for anxiety. He is on lorazepam but said he has been on it for so long it wasn't doing any good. He says in the mornings he is real anxious. On a score of 1-10 he rated his anxiety as a 5 currently, but said this morning it was a 10.

## 2022-05-23 ENCOUNTER — Other Ambulatory Visit: Payer: Self-pay | Admitting: Behavioral Health

## 2022-05-23 DIAGNOSIS — G4700 Insomnia, unspecified: Secondary | ICD-10-CM

## 2022-05-23 DIAGNOSIS — F410 Panic disorder [episodic paroxysmal anxiety] without agoraphobia: Secondary | ICD-10-CM

## 2022-05-23 DIAGNOSIS — F331 Major depressive disorder, recurrent, moderate: Secondary | ICD-10-CM

## 2022-05-23 DIAGNOSIS — F411 Generalized anxiety disorder: Secondary | ICD-10-CM

## 2022-05-31 ENCOUNTER — Ambulatory Visit: Payer: Medicare Other | Admitting: Behavioral Health

## 2022-06-02 ENCOUNTER — Ambulatory Visit: Payer: Medicare Other | Admitting: Internal Medicine

## 2022-06-03 ENCOUNTER — Other Ambulatory Visit: Payer: Self-pay | Admitting: Behavioral Health

## 2022-06-03 ENCOUNTER — Telehealth: Payer: Self-pay | Admitting: Behavioral Health

## 2022-06-03 DIAGNOSIS — F410 Panic disorder [episodic paroxysmal anxiety] without agoraphobia: Secondary | ICD-10-CM

## 2022-06-03 DIAGNOSIS — F411 Generalized anxiety disorder: Secondary | ICD-10-CM

## 2022-06-03 MED ORDER — OLANZAPINE 15 MG PO TABS: 15.0000 mg | ORAL_TABLET | Freq: Every day | ORAL | 1 refills | Status: DC

## 2022-06-03 NOTE — Telephone Encounter (Signed)
Pt LVM at 10:01a.  He has been on a new med prescribed by [name] for 5 wks.  It is making him feel worse.  He is not feeling well at all.  Pls call him to discuss.  Next appt 6/19

## 2022-06-03 NOTE — Telephone Encounter (Signed)
Consulted with Dr. Jennelle Human. Please advise the pt that these symptoms are probably not coming from the Olanzapine. He needs to stay on course with the med. We are going to push the dose to 15 mg. Have him take 1.5 tablets with his 10 mg. I sent a new script for 15 mg tablets to make it easier. Continue to take med same time. Reassure him that sometime it requires higher doses.  If he isnt feeling improvement in a couple of weeks we may add one more thing but we need to give it a little more time. Encourage him to stay the course with increased dose of Olanzapine.

## 2022-06-03 NOTE — Telephone Encounter (Signed)
LVM to RC 

## 2022-06-03 NOTE — Telephone Encounter (Signed)
See message from patient. Called him and he said it is the olanzapine 10 mg that is causing him problems. He says one time it isn't helping him sleep, but when I ask more questions - is the issue initiating sleep or staying asleep, he says he is sleeping but when he gets up in the morning he is so anxious he can't stand it. He is also agitated. He said he takes the medication about 8 PM and goes to bed about 9:30-10 PM. He asked that I have you confer with Dr. Clovis Pu to see if there was something that could help him. He has an appt 6/19. He asks if he should continue to take it. I told him continue it until I heard from you. I didn't know if it is something that needed to be weaned.

## 2022-06-07 ENCOUNTER — Ambulatory Visit: Payer: Medicare Other | Admitting: Behavioral Health

## 2022-06-07 MED ORDER — LORAZEPAM 1 MG PO TABS: 1.0000 mg | ORAL_TABLET | Freq: Three times a day (TID) | ORAL | 0 refills | Status: DC | PRN

## 2022-06-08 NOTE — Telephone Encounter (Signed)
Called patient as I had been off a couple of days and he said someone else had called him.

## 2022-06-13 ENCOUNTER — Encounter: Payer: Self-pay | Admitting: Behavioral Health

## 2022-06-13 NOTE — Progress Notes (Signed)
Patients appointment was canceled. Recommendations made to patient  by phone. Ativan was temporarily increased to 1 mg three times daily while patient adjusted to Olanzapine 15 mg to control severe anxiety.

## 2022-06-14 ENCOUNTER — Ambulatory Visit: Payer: Medicare Other | Admitting: Behavioral Health

## 2022-06-16 DIAGNOSIS — S91332A Puncture wound without foreign body, left foot, initial encounter: Secondary | ICD-10-CM | POA: Diagnosis not present

## 2022-06-21 ENCOUNTER — Other Ambulatory Visit: Payer: Self-pay | Admitting: Behavioral Health

## 2022-06-21 DIAGNOSIS — F410 Panic disorder [episodic paroxysmal anxiety] without agoraphobia: Secondary | ICD-10-CM

## 2022-06-21 DIAGNOSIS — F411 Generalized anxiety disorder: Secondary | ICD-10-CM

## 2022-06-21 DIAGNOSIS — G4700 Insomnia, unspecified: Secondary | ICD-10-CM

## 2022-06-21 DIAGNOSIS — F331 Major depressive disorder, recurrent, moderate: Secondary | ICD-10-CM

## 2022-06-28 ENCOUNTER — Encounter: Payer: Self-pay | Admitting: Behavioral Health

## 2022-06-28 ENCOUNTER — Ambulatory Visit (INDEPENDENT_AMBULATORY_CARE_PROVIDER_SITE_OTHER): Payer: Medicare Other | Admitting: Behavioral Health

## 2022-06-28 DIAGNOSIS — F411 Generalized anxiety disorder: Secondary | ICD-10-CM | POA: Diagnosis not present

## 2022-06-28 DIAGNOSIS — G4700 Insomnia, unspecified: Secondary | ICD-10-CM

## 2022-06-28 DIAGNOSIS — F331 Major depressive disorder, recurrent, moderate: Secondary | ICD-10-CM | POA: Diagnosis not present

## 2022-06-28 DIAGNOSIS — F410 Panic disorder [episodic paroxysmal anxiety] without agoraphobia: Secondary | ICD-10-CM | POA: Diagnosis not present

## 2022-06-28 MED ORDER — GABAPENTIN 300 MG PO CAPS: 300.0000 mg | ORAL_CAPSULE | Freq: Three times a day (TID) | ORAL | 1 refills | Status: DC

## 2022-06-28 MED ORDER — LORAZEPAM 1 MG PO TABS: 2.0000 mg | ORAL_TABLET | Freq: Three times a day (TID) | ORAL | 1 refills | Status: AC | PRN

## 2022-06-28 NOTE — Progress Notes (Signed)
[facility]  Patient ID: Devin Daugherty,  MRN: [id number]  PCP: Anabel Halon, MD  Date of Evaluation: 06/28/2022 Time spent:30 minutes  Chief Complaint:   HISTORY/CURRENT STATUS: HPI  71 year old male presents to this office for follow up and medication management. His wife is present during interview with his consent. He is more alert and less irritable this visit. He says that he is feeling much better. Acknowledges that he is resistant to medications but he was encouraged to continue with the increased dose of Zyprexa.  He believes Ativan has been only real thing that is effective. He has been able to get out of the house again and go to his grandsons ball game which he enjoys.  Sleep has improved with taking the Zyprexa at night. He reports anxiety at 4/10 and depression at 3/10. Says he sleeps 6-7 hours per night. He wants to follow up in 4 weeks. Too soon to adjust medications at this time.  No mania, no psychosis. No SI/HI.    Past psychiatric medication trials: Prozac Paxil  Ingrezza Risperdal Zoloft Ambien Mirtazapine-Not really working anymore Gabapentin Trazodone- Ineffective Viibryd-anger, irritability    Individual Medical History/ Review of Systems: Changes? :No   Allergies: Codeine  Current Medications:  Current Outpatient Medications:    gabapentin (NEURONTIN) 300 MG capsule, Take 1 capsule (300 mg total) by mouth 3 (three) times daily., Disp: 90 capsule, Rfl: 1   isosorbide mononitrate (IMDUR) 30 MG 24 hr tablet, Take 30 mg by mouth daily., Disp: , Rfl:    lisinopril (ZESTRIL) 40 MG tablet, Take by mouth., Disp: , Rfl:    LORazepam (ATIVAN) 0.5 MG tablet, One by mouth up to three times daily for anxiety.- per psychiatrist, Disp: 90 tablet, Rfl: 5   LORazepam (ATIVAN) 1 MG tablet, Take 2 tablets (2 mg total) by mouth every 8 (eight) hours as needed for anxiety., Disp: 90 tablet, Rfl: 1   metoprolol succinate (TOPROL-XL) 25 MG 24 hr tablet,  Take by mouth., Disp: , Rfl:    OLANZapine (ZYPREXA) 15 MG tablet, Take 1 tablet (15 mg total) by mouth at bedtime., Disp: 30 tablet, Rfl: 1   rosuvastatin (CRESTOR) 40 MG tablet, Take 1 tablet (40 mg total) by mouth daily., Disp: 90 tablet, Rfl: 3   sertraline (ZOLOFT) 100 MG tablet, TAKE 2 TABLETS BY MOUTH EVERY DAY AS NEEDED, Disp: 180 tablet, Rfl: 3   sildenafil (REVATIO) 20 MG tablet, Take 2-3 tablets po 1-2 hrs before sex, Disp: 24 tablet, Rfl: 5   SYRINGE-NEEDLE, DISP, 3 ML (B-D SYRINGE/NEEDLE 3CC/25GX5/8) 25G X 5/8" 3 ML MISC, 1 each by Does not apply route every 14 (fourteen) days., Disp: 50 each, Rfl: 0   Testosterone Cypionate 200 MG/ML SOLN, Inject 100 mg as directed every 14 (fourteen) days., Disp: 2 mL, Rfl: 1   zolpidem (AMBIEN) 5 MG tablet, TAKE 1 TABLET BY MOUTH AT BEDTIME AS NEEDED FOR SLEEP, Disp: 30 tablet, Rfl: 5 Medication Side Effects: none  Family Medical/ Social History: Changes? No  MENTAL HEALTH EXAM:  There were no vitals taken for this visit.There is no height or weight on file to calculate BMI.  General Appearance: Casual, Neat, and Well Groomed  Eye Contact:  Good  Speech:  Clear and Coherent  Volume:  Normal  Mood:  Anxious and Depressed  Affect:  Appropriate  Thought Process:  Coherent  Orientation:  Full (Time, Place, and Person)  Thought Content: Logical   Suicidal Thoughts:  No  Homicidal Thoughts:  No  Memory:  WNL  Judgement:  Good  Insight:  Good  Psychomotor Activity:  Normal  Concentration:  Concentration: Good  Recall:  Good  Fund of Knowledge: Good  Language: Good  Assets:  Desire for Improvement  ADL's:  Intact  Cognition: WNL  Prognosis:  Good    DIAGNOSES:    ICD-10-CM   1. Generalized anxiety disorder  F41.1 LORazepam (ATIVAN) 1 MG tablet    gabapentin (NEURONTIN) 300 MG capsule    2. Anxiety state  F41.1 LORazepam (ATIVAN) 1 MG tablet    3. Insomnia, unspecified type  G47.00 gabapentin (NEURONTIN) 300 MG capsule    4.  Major depressive disorder, recurrent episode, moderate (HCC)  F33.1 gabapentin (NEURONTIN) 300 MG capsule    5. Panic disorder  F41.0 gabapentin (NEURONTIN) 300 MG capsule      Receiving Psychotherapy: No    RECOMMENDATIONS:   Greater than 50% of 30  min. face to face time with patient was spent on counseling and coordination of care. We discussed his current state of stability. We discussed his significant improvement since last visit and increasing his Zyprexa to 15 mg daily.  We did increase his Ativan to 1 mg 3 times daily. At this point, the benefit outweighs the risk. Patient has not responded well to other medications.  I reinforced long term risk with him and his wife present. I consulted with Dr. Meredith Staggers about not being able to adequately control patients continued severe anxiety after trying multiple medications without success. We agreed to stay on this regimen for a few more weeks.  He is still aware of TD symptoms but did not want to try to reinitiate Ingrezza at this time.  He would like to have referral to [facility] We decided to; Will continue Zyprexa 15 mg daily To continue Gabapentin 300 mg tablet three times daily (900 mg total). Continue Ativan 1 mg three times daily as needed Continued the Zoloft 200 mg daily Continue Ambien 5 mg daily at bedtime Will report and side effects or worsening Discussed potential metabolic side effects associated with atypical antipsychotics, as well as potential risk for movement side effects. Advised pt to contact office if movement side effects occur.   To follow up in 4 weeks to reassess  Reviewed PDMP     Joan Flores, NP

## 2022-06-30 ENCOUNTER — Encounter: Payer: Self-pay | Admitting: Internal Medicine

## 2022-06-30 ENCOUNTER — Ambulatory Visit (INDEPENDENT_AMBULATORY_CARE_PROVIDER_SITE_OTHER): Payer: Medicare Other | Admitting: Internal Medicine

## 2022-06-30 VITALS — BP 118/82 | HR 72 | Resp 18 | Ht 68.0 in | Wt 216.8 lb

## 2022-06-30 DIAGNOSIS — Z1211 Encounter for screening for malignant neoplasm of colon: Secondary | ICD-10-CM | POA: Diagnosis not present

## 2022-06-30 DIAGNOSIS — E782 Mixed hyperlipidemia: Secondary | ICD-10-CM

## 2022-06-30 DIAGNOSIS — Z9861 Coronary angioplasty status: Secondary | ICD-10-CM | POA: Diagnosis not present

## 2022-06-30 DIAGNOSIS — E871 Hypo-osmolality and hyponatremia: Secondary | ICD-10-CM | POA: Diagnosis not present

## 2022-06-30 DIAGNOSIS — I2510 Atherosclerotic heart disease of native coronary artery without angina pectoris: Secondary | ICD-10-CM

## 2022-06-30 DIAGNOSIS — Z1159 Encounter for screening for other viral diseases: Secondary | ICD-10-CM

## 2022-06-30 DIAGNOSIS — E790 Hyperuricemia without signs of inflammatory arthritis and tophaceous disease: Secondary | ICD-10-CM

## 2022-06-30 DIAGNOSIS — E538 Deficiency of other specified B group vitamins: Secondary | ICD-10-CM

## 2022-06-30 DIAGNOSIS — I10 Essential (primary) hypertension: Secondary | ICD-10-CM | POA: Diagnosis not present

## 2022-06-30 DIAGNOSIS — L84 Corns and callosities: Secondary | ICD-10-CM

## 2022-06-30 DIAGNOSIS — E559 Vitamin D deficiency, unspecified: Secondary | ICD-10-CM

## 2022-06-30 NOTE — Assessment & Plan Note (Signed)
BP Readings from Last 1 Encounters:  06/30/22 118/82   Well-controlled with Lisinopril, Metoprolol and Imdur Counseled for compliance with the medications Advised DASH diet and moderate exercise/walking, at least 150 mins/week

## 2022-06-30 NOTE — Assessment & Plan Note (Addendum)
Had recent hospitalization for chest pain, deemed to be noncardiac Followed by Cardiology On Aspirin and statin On Metoprolol and Imdur

## 2022-06-30 NOTE — Assessment & Plan Note (Signed)
On Crestor Check lipid profile 

## 2022-06-30 NOTE — Patient Instructions (Signed)
Please continue taking medications as prescribed.  Please continue to maintain at least 50 ounces of fluid intake.

## 2022-06-30 NOTE — Progress Notes (Signed)
Established Patient Office Visit  Subjective:  Patient ID: Devin Daugherty, male    DOB: Sep 10, 1951  Age: 71 y.o. MRN: [id number]  CC:  Chief Complaint  Patient presents with   [facility] follow up pt went 1 month ago for chest pains elevated bp and dehydration also would like labs ordered and thinks he has gout on left foot started 3 days ago    HPI Devin Daugherty is a 71 y.o. male with past medical history of HTN, CAD s/p stent placement, HLD, depression with anxiety, CKD stage 3a, and medication induced parkinsonism who presents for f/u of his chronic medical conditions and recent hospitalization for chest pain and hyponatremia.  He was admitted at [facility] for chest pain, but is cardiac work-up was negative including cardiac enzymes.  He also had hyponatremia, likely due to dehydration, which improved with IV fluids.  His chest pain was deemed to be likely due to anxiety/panic episode.  HTN: BP is well-controlled. Takes medications regularly. Patient denies headache, dizziness, chest pain, dyspnea or palpitations.  CAD: S/p stent placement, on Aspirin and statin, followed by Cardiology.  Denies any chest pain currently.   Depression with anxiety: Followed by Psychiatry. He is on multiple medications. Denies any SI or HI.  He complains of left foot pain between the second and third toe, on the sole.  He initially asked about gout, which is less likely on that side. At at the end of the visit, he reports that he had stepped on a metal piece 2 weeks ago and his pain started after it.  He had Tdap vaccine at urgent care as well.  No open wound noted.  Past Medical History:  Diagnosis Date   Anxiety    CAD (coronary artery disease)    Depression    High cholesterol    Hypertension     Past Surgical History:  Procedure Laterality Date   COLONOSCOPY     CORONARY ANGIOPLASTY WITH STENT PLACEMENT  05/2018   Heart  Stent  2009   HERNIA REPAIR      Family  History  Problem Relation Age of Onset   Heart attack Father    Hyperlipidemia Son     Social History   Socioeconomic History   Marital status: Married    Spouse name: Not on file   Number of children: 2   Years of education: 14   Highest education level: Associate degree: occupational, Hotel manager, or vocational program  Occupational History   Occupation: retired    Fish farm manager: [employer]    Comment: welder  Tobacco Use   Smoking status: Never   Smokeless tobacco: Never  Vaping Use   Vaping Use: Never used  Substance and Sexual Activity   Alcohol use: No   Drug use: No   Sexual activity: Yes    Partners: Female    Birth control/protection: None    Comment: married  Other Topics Concern   Not on file  Social History Narrative   Married, lives at home with spouse. Multiple children and grandchildren. 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 Active in local church. Likes to travel with spouse to the beach.    Social Determinants of Health   Financial Resource Strain: Low Risk  (10/20/2021)   Overall Financial Resource Strain (CARDIA)    Difficulty of Paying Living Expenses: Not hard at all  Food Insecurity: No Food Insecurity (10/20/2021)   Hunger Vital Sign  Worried About Charity fundraiser in the Last Year: Never true    [place] in the Last Year: Never true  Transportation Needs: No Transportation Needs (10/20/2021)   PRAPARE - Hydrologist (Medical): No    Lack of Transportation (Non-Medical): No  Physical Activity: Sufficiently Active (10/20/2021)   Exercise Vital Sign    Days of Exercise per Week: 7 days    Minutes of Exercise per Session: 60 min  Stress: No Stress Concern Present (10/20/2021)   [place]    Feeling of Stress : Not at all  Social Connections: Moderately Integrated (10/20/2021)   Social Connection and Isolation Panel [NHANES]     Frequency of Communication with Friends and Family: Three times a week    Frequency of Social Gatherings with Friends and Family: Three times a week    Attends Religious Services: More than 4 times per year    Active Member of Clubs or Organizations: No    Attends Archivist Meetings: Never    Marital Status: Married  Human resources officer Violence: Not At Risk (10/20/2021)   Humiliation, Afraid, Rape, and Kick questionnaire    Fear of Current or Ex-Partner: No    Emotionally Abused: No    Physically Abused: No    Sexually Abused: No    Outpatient Medications Prior to Visit  Medication Sig Dispense Refill   gabapentin (NEURONTIN) 300 MG capsule Take 1 capsule (300 mg total) by mouth 3 (three) times daily. 90 capsule 1   isosorbide mononitrate (IMDUR) 30 MG 24 hr tablet Take 30 mg by mouth daily.     lisinopril (ZESTRIL) 40 MG tablet Take by mouth.     LORazepam (ATIVAN) 0.5 MG tablet One by mouth up to three times daily for anxiety.- per psychiatrist 90 tablet 5   LORazepam (ATIVAN) 1 MG tablet Take 2 tablets (2 mg total) by mouth every 8 (eight) hours as needed for anxiety. 90 tablet 1   metoprolol succinate (TOPROL-XL) 25 MG 24 hr tablet Take by mouth.     OLANZapine (ZYPREXA) 15 MG tablet Take 1 tablet (15 mg total) by mouth at bedtime. 30 tablet 1   rosuvastatin (CRESTOR) 40 MG tablet Take 1 tablet (40 mg total) by mouth daily. 90 tablet 3   sertraline (ZOLOFT) 100 MG tablet TAKE 2 TABLETS BY MOUTH EVERY DAY AS NEEDED 180 tablet 3   sildenafil (REVATIO) 20 MG tablet Take 2-3 tablets po 1-2 hrs before sex 24 tablet 5   SYRINGE-NEEDLE, DISP, 3 ML (B-D SYRINGE/NEEDLE 3CC/25GX5/8) 25G X 5/8" 3 ML MISC 1 each by Does not apply route every 14 (fourteen) days. 50 each 0   Testosterone Cypionate 200 MG/ML SOLN Inject 100 mg as directed every 14 (fourteen) days. 2 mL 1   zolpidem (AMBIEN) 5 MG tablet TAKE 1 TABLET BY MOUTH AT BEDTIME AS NEEDED FOR SLEEP 30 tablet 5   No  facility-administered medications prior to visit.    Allergies  Allergen Reactions   Codeine Itching    ROS Review of Systems  Constitutional:  Positive for fatigue. Negative for chills, fever and unexpected weight change.  HENT:  Negative for sinus pressure and sinus pain.   Eyes:  Negative for pain and discharge.  Respiratory:  Negative for cough and shortness of breath.   Cardiovascular:  Negative for chest pain and palpitations.  Gastrointestinal:  Negative for diarrhea, nausea and vomiting.  Genitourinary:  Negative for dysuria and hematuria.  Musculoskeletal:  Negative for neck pain and neck stiffness.       Left foot pain  Skin:  Negative for rash.  Neurological:  Negative for dizziness and seizures.  Psychiatric/Behavioral:  Negative for agitation and behavioral problems.       Objective:    Physical Exam Vitals reviewed.  Constitutional:      General: He is not in acute distress.    Appearance: He is not diaphoretic.  HENT:     Head: Normocephalic and atraumatic.     Nose: Nose normal.     Mouth/Throat:     Mouth: Mucous membranes are moist.  Eyes:     General: No scleral icterus.    Extraocular Movements: Extraocular movements intact.  Cardiovascular:     Rate and Rhythm: Normal rate and regular rhythm.     Pulses: Normal pulses.     Heart sounds: Normal heart sounds. No murmur heard. Pulmonary:     Breath sounds: Normal breath sounds. No wheezing or rales.  Musculoskeletal:     Cervical back: Neck supple. No tenderness.     Right lower leg: No edema.     Left lower leg: No edema.  Feet:     Left foot:     Skin integrity: Callus (Between second and third toe) present.  Skin:    General: Skin is warm.     Findings: No rash.  Neurological:     General: No focal deficit present.     Mental Status: He is alert and oriented to person, place, and time.  Psychiatric:        Mood and Affect: Mood normal.        Behavior: Behavior normal.     BP  118/82 (BP Location: Right Arm, Patient Position: Sitting, Cuff Size: Normal)   Pulse 72   Resp 18   Ht _0  (1.727 m)   Wt 216 lb 12.8 oz (98.3 kg)   SpO2 96%   BMI 32.96 kg/m  Wt Readings from Last 3 Encounters:  06/30/22 216 lb 12.8 oz (98.3 kg)  04/24/22 225 lb 15.5 oz (102.5 kg)  12/01/21 226 lb 1.3 oz (102.5 kg)    Lab Results  Component Value Date   TSH 2.540 10/23/2021   Lab Results  Component Value Date   WBC 12.8 (H) 04/24/2022   HGB 14.5 04/24/2022   HCT 42.8 04/24/2022   MCV 88.2 04/24/2022   PLT 299 04/24/2022   Lab Results  Component Value Date   NA 137 04/24/2022   K 4.1 04/24/2022   CO2 22 04/24/2022   GLUCOSE 102 (H) 04/24/2022   BUN 22 04/24/2022   CREATININE 1.57 (H) 04/24/2022   BILITOT <0.2 05/21/2021   ALKPHOS 86 05/21/2021   AST 19 05/21/2021   ALT 22 05/21/2021   PROT 7.7 05/21/2021   ALBUMIN 4.6 05/21/2021   CALCIUM 9.1 04/24/2022   ANIONGAP 7 04/24/2022   EGFR 57 (L) 05/21/2021   Lab Results  Component Value Date   CHOL 164 05/21/2021   Lab Results  Component Value Date   HDL 40 05/21/2021   Lab Results  Component Value Date   LDLCALC 84 05/21/2021   Lab Results  Component Value Date   TRIG 240 (H) 05/21/2021   Lab Results  Component Value Date   CHOLHDL 4.1 05/21/2021   Lab Results  Component Value Date   HGBA1C 5.8 12/13/2014      Assessment & Plan:  Problem List Items Addressed This Visit       Cardiovascular and Mediastinum   Hypertension (Chronic)    BP Readings from Last 1 Encounters:  06/30/22 118/82  Well-controlled with Lisinopril, Metoprolol and Imdur Counseled for compliance with the medications Advised DASH diet and moderate exercise/walking, at least 150 mins/week      Relevant Orders   CBC   CAD S/P percutaneous coronary angioplasty - Primary    Had recent hospitalization for chest pain, deemed to be noncardiac Followed by Cardiology On Aspirin and statin On Metoprolol and Imdur       Relevant Orders   CBC     Other   Vitamin B12 deficiency   Relevant Orders   B12   Vitamin D deficiency   Relevant Orders   Vitamin D (25 hydroxy)   Mixed hyperlipidemia    On Crestor Check lipid profile      Relevant Orders   Lipid Profile   Hyponatremia    Recent hospitalization -had hyponatremia, Na 127, later improved with IV hydration Check CMP Advised to maintain adequate hydration      Relevant Orders   CMP14+EGFR   Other Visit Diagnoses     Special screening for malignant neoplasms, colon       Relevant Orders   Cologuard   Need for hepatitis C screening test       Relevant Orders   Hepatitis C Antibody   Hyperuricemia       Relevant Orders   Uric acid   Callus between toes     Could be due to recent injury on foot Has thickening of skin between toes, no open wound noted   Relevant Orders   Ambulatory referral to Podiatry       No orders of the defined types were placed in this encounter.   Follow-up: Return in about 3 months (around 09/30/2022) for Annual physical.    Lindell Spar, MD

## 2022-06-30 NOTE — Assessment & Plan Note (Signed)
Recent hospitalization -had hyponatremia, Na 127, later improved with IV hydration Check CMP Advised to maintain adequate hydration

## 2022-07-01 ENCOUNTER — Telehealth: Payer: Self-pay

## 2022-07-01 LAB — CMP14+EGFR
ALT: 25 IU/L (ref 0–44)
AST: 20 IU/L (ref 0–40)
Albumin/Globulin Ratio: 1.6 (ref 1.2–2.2)
Albumin: 4.5 g/dL (ref 3.7–4.7)
Alkaline Phosphatase: 81 IU/L (ref 44–121)
BUN/Creatinine Ratio: 10 (ref 10–24)
BUN: 14 mg/dL (ref 8–27)
Bilirubin Total: 0.2 mg/dL (ref 0.0–1.2)
CO2: 19 mmol/L — ABNORMAL LOW (ref 20–29)
Calcium: 9.7 mg/dL (ref 8.6–10.2)
Chloride: 103 mmol/L (ref 96–106)
Creatinine, Ser: 1.39 mg/dL — ABNORMAL HIGH (ref 0.76–1.27)
Globulin, Total: 2.9 g/dL (ref 1.5–4.5)
Glucose: 78 mg/dL (ref 70–99)
Potassium: 5.3 mmol/L — ABNORMAL HIGH (ref 3.5–5.2)
Sodium: 137 mmol/L (ref 134–144)
Total Protein: 7.4 g/dL (ref 6.0–8.5)
eGFR: 54 mL/min/{1.73_m2} — ABNORMAL LOW (ref >59)

## 2022-07-01 LAB — VITAMIN B12: Vitamin B-12: 395 pg/mL (ref 232–1245)

## 2022-07-01 LAB — LIPID PANEL
Chol/HDL Ratio: 4.3 ratio (ref 0.0–5.0)
Cholesterol, Total: 152 mg/dL (ref 100–199)
HDL: 35 mg/dL — ABNORMAL LOW (ref >39)
LDL Chol Calc (NIH): 60 mg/dL (ref 0–99)
Triglycerides: 369 mg/dL — ABNORMAL HIGH (ref 0–149)
VLDL Cholesterol Cal: 57 mg/dL — ABNORMAL HIGH (ref 5–40)

## 2022-07-01 LAB — CBC
Hematocrit: 43.7 % (ref 37.5–51.0)
Hemoglobin: 15 g/dL (ref 13.0–17.7)
MCH: 30 pg (ref 26.6–33.0)
MCHC: 34.3 g/dL (ref 31.5–35.7)
MCV: 87 fL (ref 79–97)
Platelets: 354 10*3/uL (ref 150–450)
RBC: 5 x10E6/uL (ref 4.14–5.80)
RDW: 13.8 % (ref 11.6–15.4)
WBC: 9.9 10*3/uL (ref 3.4–10.8)

## 2022-07-01 LAB — URIC ACID: Uric Acid: 7.4 mg/dL (ref 3.8–8.4)

## 2022-07-01 LAB — HEPATITIS C ANTIBODY: Hep C Virus Ab: NONREACTIVE

## 2022-07-01 LAB — VITAMIN D 25 HYDROXY (VIT D DEFICIENCY, FRACTURES): Vit D, 25-Hydroxy: 20.8 ng/mL — ABNORMAL LOW (ref 30.0–100.0)

## 2022-07-01 NOTE — Telephone Encounter (Signed)
Pt states that they missed a call from nurse. Pt's spouse stated that their home phone is broken. Pt would like a call back on her phone.  Cb#: [phone]

## 2022-07-02 ENCOUNTER — Other Ambulatory Visit: Payer: Self-pay | Admitting: Behavioral Health

## 2022-07-02 DIAGNOSIS — F410 Panic disorder [episodic paroxysmal anxiety] without agoraphobia: Secondary | ICD-10-CM

## 2022-07-02 DIAGNOSIS — F411 Generalized anxiety disorder: Secondary | ICD-10-CM

## 2022-07-05 ENCOUNTER — Telehealth: Payer: Self-pay | Admitting: Behavioral Health

## 2022-07-05 NOTE — Telephone Encounter (Signed)
Faxed referral to [facility] fax [phone]

## 2022-07-07 ENCOUNTER — Encounter: Payer: Self-pay | Admitting: Internal Medicine

## 2022-07-18 ENCOUNTER — Other Ambulatory Visit: Payer: Self-pay | Admitting: Behavioral Health

## 2022-07-18 DIAGNOSIS — F411 Generalized anxiety disorder: Secondary | ICD-10-CM

## 2022-07-18 DIAGNOSIS — F410 Panic disorder [episodic paroxysmal anxiety] without agoraphobia: Secondary | ICD-10-CM

## 2022-07-19 ENCOUNTER — Telehealth: Payer: Self-pay | Admitting: Behavioral Health

## 2022-07-19 NOTE — Telephone Encounter (Signed)
Dewel's wife Devin Daugherty called. She is listed on DPR to speak to. Devin Daugherty doesn't want to go to [facility]. Instead they want a referral to be sent to the following provider.   Trena Platt, MD [address] [facility] [address], [place], [place] [address]. Phone number is [phone].

## 2022-07-20 ENCOUNTER — Telehealth: Payer: Self-pay

## 2022-07-20 NOTE — Telephone Encounter (Signed)
Pharmacy said patient picked up Rx for 0.5 mg on 7/19. Is it okay to wait until next RF due to start 1 mg or have patient return 0.5 and fill 1 mg.

## 2022-07-20 NOTE — Telephone Encounter (Signed)
Have him try to return and say that they filled wrong RX. If he will be using more and this will be chaos getting back on schedule.

## 2022-07-20 NOTE — Telephone Encounter (Signed)
Should be 1 mg three times daily

## 2022-07-21 NOTE — Telephone Encounter (Signed)
Patient has not taken any of the 0.5 mg tablets. He will return that script. The pharmacy has the 1 mg dosing ready for pickup. Patient's wife notified.

## 2022-07-21 NOTE — Telephone Encounter (Signed)
LVM to RC 

## 2022-07-27 ENCOUNTER — Telehealth: Payer: Self-pay

## 2022-07-27 DIAGNOSIS — G20 Parkinson's disease: Secondary | ICD-10-CM | POA: Diagnosis not present

## 2022-07-27 DIAGNOSIS — I10 Essential (primary) hypertension: Secondary | ICD-10-CM | POA: Diagnosis not present

## 2022-08-02 ENCOUNTER — Other Ambulatory Visit: Payer: Self-pay | Admitting: Behavioral Health

## 2022-08-02 ENCOUNTER — Ambulatory Visit: Payer: Medicare Other | Admitting: Behavioral Health

## 2022-08-02 DIAGNOSIS — F411 Generalized anxiety disorder: Secondary | ICD-10-CM

## 2022-08-05 ENCOUNTER — Encounter: Payer: Self-pay | Admitting: Behavioral Health

## 2022-08-05 ENCOUNTER — Ambulatory Visit: Payer: Medicare Other | Admitting: Behavioral Health

## 2022-08-05 DIAGNOSIS — F411 Generalized anxiety disorder: Secondary | ICD-10-CM | POA: Diagnosis not present

## 2022-08-05 DIAGNOSIS — F410 Panic disorder [episodic paroxysmal anxiety] without agoraphobia: Secondary | ICD-10-CM | POA: Diagnosis not present

## 2022-08-05 DIAGNOSIS — G4700 Insomnia, unspecified: Secondary | ICD-10-CM | POA: Diagnosis not present

## 2022-08-05 DIAGNOSIS — F331 Major depressive disorder, recurrent, moderate: Secondary | ICD-10-CM

## 2022-08-05 MED ORDER — LORAZEPAM 1 MG PO TABS: 1.0000 mg | ORAL_TABLET | Freq: Three times a day (TID) | ORAL | 3 refills | Status: DC

## 2022-08-05 MED ORDER — GABAPENTIN 300 MG PO CAPS: 300.0000 mg | ORAL_CAPSULE | Freq: Three times a day (TID) | ORAL | 3 refills | Status: DC

## 2022-08-05 MED ORDER — OLANZAPINE 15 MG PO TABS: 15.0000 mg | ORAL_TABLET | Freq: Every day | ORAL | 5 refills | Status: DC

## 2022-08-05 NOTE — Progress Notes (Signed)
[facility]  Patient ID: Devin Daugherty,  MRN: [id number]  PCP: Anabel Halon, MD  Date of Evaluation: 08/05/2022 Time spent:30 minutes  Chief Complaint:  Chief Complaint   Anxiety; Depression; Follow-up; Medication Refill; Medication Problem; Patient Education     HISTORY/CURRENT STATUS: HPI  71 year old male presents to this office for follow up and medication management.  His wife is present during interview with his consent.  Continue to improve since last visit. Says he has followed up with neurology and has pending MRI. Says neurologist suspects some parkinsonian and will continue to assess. He is more alert and less irritable this visit. He says that he is feeling much better. Acknowledges that he is resistant to medications but he was encouraged to continue with the increased dose of Zyprexa.  He believes Ativan has been only real thing that is effective. He has been able to get out of the house again and go to his grandsons ball game which he enjoys.  Sleep has improved with taking the Zyprexa at night. He reports anxiety at 4/10 and depression at 3/10. Says he sleeps 6-7 hours per night. He wants to follow up in 4 weeks. Too soon to adjust medications at this time.  No mania, no psychosis. No SI/HI.    Past psychiatric medication trials: Prozac Paxil  Ingrezza Risperdal Zoloft Ambien Mirtazapine-Not really working anymore Gabapentin Trazodone- Ineffective Viibryd-anger, irritability        Individual Medical History/ Review of Systems: Changes? :No   Allergies: Codeine  Current Medications:  Current Outpatient Medications:    LORazepam (ATIVAN) 1 MG tablet, Take 1 tablet (1 mg total) by mouth every 8 (eight) hours., Disp: 90 tablet, Rfl: 3   gabapentin (NEURONTIN) 300 MG capsule, Take 1 capsule (300 mg total) by mouth 3 (three) times daily., Disp: 90 capsule, Rfl: 3   isosorbide mononitrate (IMDUR) 30 MG 24 hr tablet, Take 30 mg by mouth daily.,  Disp: , Rfl:    lisinopril (ZESTRIL) 40 MG tablet, Take by mouth., Disp: , Rfl:    LORazepam (ATIVAN) 0.5 MG tablet, One by mouth up to three times daily for anxiety.- per psychiatrist, Disp: 90 tablet, Rfl: 5   metoprolol succinate (TOPROL-XL) 25 MG 24 hr tablet, Take by mouth., Disp: , Rfl:    OLANZapine (ZYPREXA) 15 MG tablet, Take 1 tablet (15 mg total) by mouth at bedtime., Disp: 30 tablet, Rfl: 5   rosuvastatin (CRESTOR) 40 MG tablet, Take 1 tablet (40 mg total) by mouth daily., Disp: 90 tablet, Rfl: 3   sertraline (ZOLOFT) 100 MG tablet, TAKE 2 TABLETS BY MOUTH EVERY DAY AS NEEDED, Disp: 180 tablet, Rfl: 3   sildenafil (REVATIO) 20 MG tablet, Take 2-3 tablets po 1-2 hrs before sex, Disp: 24 tablet, Rfl: 5   SYRINGE-NEEDLE, DISP, 3 ML (B-D SYRINGE/NEEDLE 3CC/25GX5/8) 25G X 5/8" 3 ML MISC, 1 each by Does not apply route every 14 (fourteen) days., Disp: 50 each, Rfl: 0   Testosterone Cypionate 200 MG/ML SOLN, Inject 100 mg as directed every 14 (fourteen) days., Disp: 2 mL, Rfl: 1   zolpidem (AMBIEN) 5 MG tablet, TAKE 1 TABLET BY MOUTH AT BEDTIME AS NEEDED FOR SLEEP, Disp: 30 tablet, Rfl: 5 Medication Side Effects: none  Family Medical/ Social History: Changes? No  MENTAL HEALTH EXAM:  There were no vitals taken for this visit.There is no height or weight on file to calculate BMI.  General Appearance: Casual, Neat, and Well Groomed  Eye Contact:  Good  Speech:  Clear and Coherent  Volume:  Normal  Mood:  NA  Affect:  Appropriate  Thought Process:  Coherent  Orientation:  Full (Time, Place, and Person)  Thought Content: Logical   Suicidal Thoughts:  No  Homicidal Thoughts:  No  Memory:  WNL  Judgement:  Good  Insight:  Good  Psychomotor Activity:  Normal  Concentration:  Concentration: Good  Recall:  Good  Fund of Knowledge: Good  Language: Good  Assets:  Desire for Improvement  ADL's:  Intact  Cognition: WNL  Prognosis:  Good    DIAGNOSES:    ICD-10-CM   1. Generalized  anxiety disorder  F41.1 OLANZapine (ZYPREXA) 15 MG tablet    LORazepam (ATIVAN) 1 MG tablet    gabapentin (NEURONTIN) 300 MG capsule    2. Panic disorder  F41.0 OLANZapine (ZYPREXA) 15 MG tablet    LORazepam (ATIVAN) 1 MG tablet    gabapentin (NEURONTIN) 300 MG capsule    3. Anxiety state  F41.1 OLANZapine (ZYPREXA) 15 MG tablet    LORazepam (ATIVAN) 1 MG tablet    4. Insomnia, unspecified type  G47.00 gabapentin (NEURONTIN) 300 MG capsule    5. Major depressive disorder, recurrent episode, moderate (HCC)  F33.1 gabapentin (NEURONTIN) 300 MG capsule      Receiving Psychotherapy: No    RECOMMENDATIONS:    Greater than 50% of 30  min. face to face time with patient was spent on counseling and coordination of care. We discussed his current state of stability. We discussed his significant improvement since last visit and increasing his Zyprexa to 15 mg daily. He continues to report controlled anxiety at manageable levels.  Will continue  Ativan 1 mg 3 times daily. At this point, the benefit outweighs the risk. Patient has not responded well to other medications.  I reinforced long term risk with him and his wife present. He is still aware of TD symptoms but did not want to try to reinitiate Ingrezza at this time.  Patient will continue to follow up with neurology. Has f/u scheduled for MRI.   We decided to; Will continue Zyprexa 15 mg daily To continue Gabapentin 300 mg tablet three times daily (900 mg total). Continue Ativan 1 mg three times daily as needed Continued the Zoloft 200 mg daily Continue Ambien 5 mg daily at bedtime Will report and side effects or worsening Discussed potential metabolic side effects associated with atypical antipsychotics, as well as potential risk for movement side effects. Advised pt to contact office if movement side effects occur.   To follow up in 4 weeks to reassess  Pt is to continue f/u with neurology as needed.  Reviewed PDMP          Joan Flores, NP

## 2022-08-09 ENCOUNTER — Telehealth: Payer: Self-pay | Admitting: Behavioral Health

## 2022-08-09 DIAGNOSIS — G20 Parkinson's disease: Secondary | ICD-10-CM | POA: Diagnosis not present

## 2022-08-09 DIAGNOSIS — R9082 White matter disease, unspecified: Secondary | ICD-10-CM | POA: Diagnosis not present

## 2022-08-09 DIAGNOSIS — E236 Other disorders of pituitary gland: Secondary | ICD-10-CM | POA: Diagnosis not present

## 2022-08-09 NOTE — Telephone Encounter (Signed)
[facility] Dr. Stephen Cockburn [phone]  

## 2022-08-09 NOTE — Telephone Encounter (Signed)
See note from pharmacy.   Neurology note from last visit.   Plan  Parkinson's/parkinsonism: - Trial of discontinuing olanzapine - Sinemet 25/100 mg 3 times daily - Copper - Ceruloplasmin - Brain MRI with and without contrast - Consider DaTscan  Please advise.

## 2022-08-09 NOTE — Telephone Encounter (Signed)
[facility] Dr. Canary Brim [phone]

## 2022-08-09 NOTE — Telephone Encounter (Signed)
Pharmacy called and said that there is a drug interaction between the zyprexa and sinemet which pt's neurologist prescribes. The pharmacy says that he has parkinsons and that the zyprexa will cause tremors. The pharmacist would like brian and the neurologist to talk about his care. They don't feel comfortable about filling the zyprexa. Pt doesn't know why he can't get it filled

## 2022-08-09 NOTE — Telephone Encounter (Signed)
Please get information for his neurologist so I can reach out before 5 pm We have had a horrible time with his anxiety.

## 2022-08-10 NOTE — Telephone Encounter (Signed)
Called patient and he has no olanzapine left. Patient has called neurologist and was told he was out of the office until tomorrow, same as you were told. Patient is doing okay right now. He is taking the Sinemet, said he thought he had been taking it for a month, but his neurology appt where it was documented to stop the olanzapine was 8/1. Patient said he was not aware that he was supposed to stop taking the olanzapine.

## 2022-08-10 NOTE — Telephone Encounter (Signed)
Please update pt that I am still trying to contact MD. He is out of office today but nurse says she will have him call me tomorrow.  He should continue his Olanzapine until I talk to Neurology. If he has not started the Sinemet yet he could just wait until I clear this up. Thanks

## 2022-08-13 ENCOUNTER — Telehealth: Payer: Self-pay | Admitting: Behavioral Health

## 2022-08-13 NOTE — Telephone Encounter (Signed)
No do not send until I reach his MD. I will let you know later today. Please tell him, Im trying to reach his doctor. As for now they are not allowing the two medication togther.

## 2022-08-13 NOTE — Telephone Encounter (Signed)
Called patient and let him know that patient could not get olanzapine until Dr. Rosalyn Gess is able to fine tune his dx. Patient is upset that he is unable to get the olanzapine. I told him to continue to F/U with Dr. Rosalyn Gess.

## 2022-08-13 NOTE — Telephone Encounter (Signed)
Please review.Ok to send?

## 2022-08-13 NOTE — Telephone Encounter (Signed)
Devin Daugherty's wife Devin Daugherty called to inform the MRI he had done shows no signs of Parkinson disease. He is currently out of his Olanzapine and needs a refill on it. Pharmacy is:  [facility]. - [place], [place] - [address]  Phone:  [phone]  Fax:  [phone]

## 2022-08-13 NOTE — Telephone Encounter (Signed)
Could you please contact the patient and tell him that I was able to reach Dr. Rosalyn Gess. The MRI will not show a dx of Parkinson's alone but is a tool to see if there are other issues of concern. He currently does have a diagnosis of Parkinson. Dr. Rosalyn Gess needs this time with him not taking Olanzapine to further fine tune his dx.  For now, he will have to manage without Olanzapine because of the conflict between the two medications. He needs to continue to follow up with him for now until we know more.

## 2022-08-16 NOTE — Telephone Encounter (Signed)
I want him to consult with his neurologist first. I want them to express to him how severe his anxiety has been and discuss other medications that may be safer right now while MD is working with him for Parkinson.  I cannot increase any benzodiazapine use any further. He need to follow up with him right now. All the drugs like Olanzapine are in the same class. That is why he needs to follow up with neuro first. I cannot do anything until its cleared with neuro.

## 2022-08-16 NOTE — Telephone Encounter (Signed)
Pt's wife, Darl Pikes LVM @ 1:06p.  She said they talked to someone on Friday and she and the pt are both confused about meds he had to stop taking.  They would appreciate a call back to get an understanding.  Next appt 9/21

## 2022-08-16 NOTE — Telephone Encounter (Signed)
Did you hear back from his doctor?

## 2022-08-16 NOTE — Telephone Encounter (Signed)
I spoke with Dr. Rosalyn Gess and he does not want him on Olazapine because of the conflict. There is nothing I can do. He needs to follow up with Dr. Rosalyn Gess about this at this point.

## 2022-08-16 NOTE — Telephone Encounter (Signed)
Wife is requesting a sooner appt then 9/21.Do you want me to add to the cancellation list?Mike is asking for something to help anxiety since he can't take olanzapine.Ativan is no longer helpful.

## 2022-08-16 NOTE — Telephone Encounter (Signed)
Yes, I sent Devin Daugherty back a reply.  His Neurologist started him on new med for Parkinsons. He is to continue that medication. The neurologist wanted him to stop the olanzapine for now. No other changes. Any further questions about why he should stop the Olazapine or the new medication directed to his neurologist.

## 2022-08-17 ENCOUNTER — Telehealth: Payer: Self-pay | Admitting: Behavioral Health

## 2022-08-17 NOTE — Telephone Encounter (Signed)
LVM to RC 

## 2022-08-17 NOTE — Telephone Encounter (Signed)
Wife Devin Daugherty called asking for RTC ASAP. Pt really sick today and need to know what to do. [phone]

## 2022-08-17 NOTE — Telephone Encounter (Signed)
There is another message with details of situation documented.

## 2022-08-18 NOTE — Telephone Encounter (Signed)
Attempted to call wife twice this week to explain but no answer.I will give them this message when they call back.

## 2022-09-09 DIAGNOSIS — G20 Parkinson's disease: Secondary | ICD-10-CM | POA: Diagnosis not present

## 2022-09-09 DIAGNOSIS — E559 Vitamin D deficiency, unspecified: Secondary | ICD-10-CM | POA: Diagnosis not present

## 2022-09-09 DIAGNOSIS — I10 Essential (primary) hypertension: Secondary | ICD-10-CM | POA: Diagnosis not present

## 2022-09-16 ENCOUNTER — Encounter: Payer: Self-pay | Admitting: Behavioral Health

## 2022-09-16 ENCOUNTER — Ambulatory Visit (INDEPENDENT_AMBULATORY_CARE_PROVIDER_SITE_OTHER): Payer: Medicare Other | Admitting: Behavioral Health

## 2022-09-16 DIAGNOSIS — F1320 Sedative, hypnotic or anxiolytic dependence, uncomplicated: Secondary | ICD-10-CM | POA: Diagnosis not present

## 2022-09-16 DIAGNOSIS — G2111 Neuroleptic induced parkinsonism: Secondary | ICD-10-CM

## 2022-09-16 DIAGNOSIS — F411 Generalized anxiety disorder: Secondary | ICD-10-CM

## 2022-09-16 DIAGNOSIS — F331 Major depressive disorder, recurrent, moderate: Secondary | ICD-10-CM | POA: Diagnosis not present

## 2022-09-16 DIAGNOSIS — F410 Panic disorder [episodic paroxysmal anxiety] without agoraphobia: Secondary | ICD-10-CM | POA: Diagnosis not present

## 2022-09-16 MED ORDER — BUSPIRONE HCL 15 MG PO TABS: ORAL_TABLET | ORAL | 1 refills | Status: DC

## 2022-09-16 NOTE — Progress Notes (Signed)
[facility]  Patient ID: Devin Daugherty,  MRN: [id number]  PCP: Anabel Halon, MD  Date of Evaluation: 09/16/2022 Time spent:30 minutes  Chief Complaint:   HISTORY/CURRENT STATUS: HPI 71 year old male presents to this office for follow up and medication management.  His wife is present during interview with his consent.  He looks distressed again this visit. Says the Zyprexa is not working as well now. Says that all previous drug trials end up like this. He is asking for re trial of Buspar which he says he has taken before. He complains of anhedonia and lack energy.  Sleep has improved with taking the Zyprexa at night. He reports anxiety at 5/10 and depression at 4/10. Says he sleeps 6-7 hours per night. He wants to follow up in 6 weeks.  No mania, no psychosis. No SI/HI. To continue to follow up with neurology.   Past psychiatric medication trials: Prozac Paxil  Ingrezza Risperdal Zoloft Ambien Mirtazapine-Not really working anymore Gabapentin Trazodone- Ineffective Viibryd-anger, irritability             Individual Medical History/ Review of Systems: Changes? :No   Allergies: Codeine  Current Medications:  Current Outpatient Medications:    busPIRone (BUSPAR) 15 MG tablet, Take 1/3 tablet p.o. twice daily for 1 week, then take 2/3 tablet p.o. twice daily for 1 week, then take 1 tablet p.o. twice daily, Disp: 30 tablet, Rfl: 1   gabapentin (NEURONTIN) 300 MG capsule, Take 1 capsule (300 mg total) by mouth 3 (three) times daily., Disp: 90 capsule, Rfl: 3   isosorbide mononitrate (IMDUR) 30 MG 24 hr tablet, Take 30 mg by mouth daily., Disp: , Rfl:    lisinopril (ZESTRIL) 40 MG tablet, Take by mouth., Disp: , Rfl:    LORazepam (ATIVAN) 0.5 MG tablet, One by mouth up to three times daily for anxiety.- per psychiatrist, Disp: 90 tablet, Rfl: 5   LORazepam (ATIVAN) 1 MG tablet, Take 1 tablet (1 mg total) by mouth every 8 (eight) hours., Disp: 90 tablet, Rfl:  3   OLANZapine (ZYPREXA) 15 MG tablet, Take 1 tablet (15 mg total) by mouth at bedtime., Disp: 30 tablet, Rfl: 5   rosuvastatin (CRESTOR) 40 MG tablet, Take 1 tablet (40 mg total) by mouth daily., Disp: 90 tablet, Rfl: 3   sertraline (ZOLOFT) 100 MG tablet, TAKE 2 TABLETS BY MOUTH EVERY DAY AS NEEDED, Disp: 180 tablet, Rfl: 3   sildenafil (REVATIO) 20 MG tablet, Take 2-3 tablets po 1-2 hrs before sex, Disp: 24 tablet, Rfl: 5   SYRINGE-NEEDLE, DISP, 3 ML (B-D SYRINGE/NEEDLE 3CC/25GX5/8) 25G X 5/8" 3 ML MISC, 1 each by Does not apply route every 14 (fourteen) days., Disp: 50 each, Rfl: 0   Testosterone Cypionate 200 MG/ML SOLN, Inject 100 mg as directed every 14 (fourteen) days., Disp: 2 mL, Rfl: 1   zolpidem (AMBIEN) 5 MG tablet, TAKE 1 TABLET BY MOUTH AT BEDTIME AS NEEDED FOR SLEEP, Disp: 30 tablet, Rfl: 5 Medication Side Effects: none  Family Medical/ Social History: Changes? No  MENTAL HEALTH EXAM:  There were no vitals taken for this visit.There is no height or weight on file to calculate BMI.  General Appearance: Casual, Neat, and Well Groomed  Eye Contact:  Good  Speech:  Clear and Coherent  Volume:  Normal  Mood:  Anxious, Depressed, and Dysphoric  Affect:  Congruent, Depressed, Flat, and Anxious  Thought Process:  Coherent  Orientation:  Full (Time, Place, and Person)  Thought Content:  Logical   Suicidal Thoughts:  No  Homicidal Thoughts:  No  Memory:  WNL  Judgement:  Good  Insight:  Good  Psychomotor Activity:  Normal  Concentration:  Concentration: Good  Recall:  Good  Fund of Knowledge: Good  Language: Good  Assets:  Desire for Improvement  ADL's:  Intact  Cognition: WNL  Prognosis:  Good    DIAGNOSES:    ICD-10-CM   1. Generalized anxiety disorder  F41.1 busPIRone (BUSPAR) 15 MG tablet    2. Panic disorder  F41.0 busPIRone (BUSPAR) 15 MG tablet    3. Major depressive disorder, recurrent episode, moderate (HCC)  F33.1     4. Benzodiazepine dependence ([place])   F13.20     5. Neuroleptic-induced parkinsonism ([place])  G21.11       Receiving Psychotherapy: No    RECOMMENDATIONS:  Greater than 50% of 30  min. face to face time with patient was spent on counseling and coordination of care. We discussed his current state of stability. We discussed Zyprexa and its significant reduction of anxiety the first couple of months of taking. Now he is complaining of return of anxiety and 5/10. He is requesting for help and says that he would like to try Buspar again to supplement. We discussed his visit with neurology and he still questions wether he has officially been diagnosed yet. I told he and his wife to clarify this with neurology and request further education.  Will continue  Ativan 1 mg 3 times daily. At this point, the benefit outweighs the risk. Patient has not responded well to other medications.  I reinforced long term risk with him and his wife present.   I explained to patient today that I felt that we were reaching the point about my confidence to be able to help his chronic anxiety with medication. He understands he has a long and complex hx of medication trials and nothing has been successful long term. His wife complains that he is too lethargic now from medication effects.  Patient will continue to follow up with neurology.  We decided to; Will continue Zyprexa 15 mg daily To continue Gabapentin 300 mg tablet three times daily (900 mg total). Discussed potential benefits, risks, and side effects of BuSpar.  Patient agrees to trial of BuSpar.  Will start BuSpar 15 mg 1/3 tablet twice daily for 1 week, then increase to 2/3 tablet twice daily for 1 week, then increase to 1 tablet twice daily for anxiety.  Continue Ativan 1 mg three times daily as needed Continued the Zoloft 200 mg daily Continue Ambien 5 mg daily at bedtime Will report and side effects or worsening Discussed potential metabolic side effects associated with atypical antipsychotics, as  well as potential risk for movement side effects. Advised pt to contact office if movement side effects occur.   To follow up in 4 weeks to reassess  Pt is to continue f/u with neurology as needed.  Reviewed PDMP     Elwanda Brooklyn, NP

## 2022-09-21 ENCOUNTER — Other Ambulatory Visit: Payer: Self-pay | Admitting: Behavioral Health

## 2022-09-21 DIAGNOSIS — G4700 Insomnia, unspecified: Secondary | ICD-10-CM

## 2022-09-28 ENCOUNTER — Other Ambulatory Visit: Payer: Self-pay | Admitting: Behavioral Health

## 2022-09-28 DIAGNOSIS — G4700 Insomnia, unspecified: Secondary | ICD-10-CM

## 2022-09-28 NOTE — Telephone Encounter (Signed)
Filled 9/6 appt 11/8

## 2022-09-30 ENCOUNTER — Encounter: Payer: Medicare Other | Admitting: Internal Medicine

## 2022-09-30 DIAGNOSIS — G20B2 Parkinson's disease with dyskinesia, with fluctuations: Secondary | ICD-10-CM | POA: Diagnosis not present

## 2022-09-30 DIAGNOSIS — G20A1 Parkinson's disease without dyskinesia, without mention of fluctuations: Secondary | ICD-10-CM | POA: Diagnosis not present

## 2022-10-11 DIAGNOSIS — F419 Anxiety disorder, unspecified: Secondary | ICD-10-CM | POA: Diagnosis not present

## 2022-10-11 DIAGNOSIS — I10 Essential (primary) hypertension: Secondary | ICD-10-CM | POA: Diagnosis not present

## 2022-10-16 ENCOUNTER — Other Ambulatory Visit: Payer: Self-pay | Admitting: Behavioral Health

## 2022-10-16 ENCOUNTER — Other Ambulatory Visit: Payer: Self-pay | Admitting: Internal Medicine

## 2022-10-16 DIAGNOSIS — E782 Mixed hyperlipidemia: Secondary | ICD-10-CM

## 2022-10-16 DIAGNOSIS — F410 Panic disorder [episodic paroxysmal anxiety] without agoraphobia: Secondary | ICD-10-CM

## 2022-10-16 DIAGNOSIS — G4700 Insomnia, unspecified: Secondary | ICD-10-CM

## 2022-10-16 DIAGNOSIS — F411 Generalized anxiety disorder: Secondary | ICD-10-CM

## 2022-10-16 DIAGNOSIS — I2510 Atherosclerotic heart disease of native coronary artery without angina pectoris: Secondary | ICD-10-CM

## 2022-10-16 DIAGNOSIS — F331 Major depressive disorder, recurrent, moderate: Secondary | ICD-10-CM

## 2022-10-18 ENCOUNTER — Other Ambulatory Visit: Payer: Self-pay | Admitting: Behavioral Health

## 2022-10-18 DIAGNOSIS — G4700 Insomnia, unspecified: Secondary | ICD-10-CM

## 2022-10-19 NOTE — Telephone Encounter (Signed)
Last filled 10/3, due 10/31

## 2022-10-25 ENCOUNTER — Ambulatory Visit (INDEPENDENT_AMBULATORY_CARE_PROVIDER_SITE_OTHER): Payer: Medicare Other | Admitting: Internal Medicine

## 2022-10-25 ENCOUNTER — Encounter: Payer: Self-pay | Admitting: Internal Medicine

## 2022-10-25 DIAGNOSIS — Z0000 Encounter for general adult medical examination without abnormal findings: Secondary | ICD-10-CM | POA: Diagnosis not present

## 2022-10-25 NOTE — Progress Notes (Signed)
Subjective:  This is a telephone encounter between [name] and [name] on 10/25/2022 for AWV. The visit was conducted with the patient located at home and [name] at [facility]. The patient's identity was confirmed using their DOB and current address. The patient has consented to being evaluated through a telephone encounter and understands the associated risks (an examination cannot be done and the patient may need to come in for an appointment) / benefits (allows the patient to remain at home, decreasing exposure to coronavirus).      Devin Daugherty is a 71 y.o. male who presents for Medicare Annual/Subsequent preventive examination.  Review of Systems    Review of Systems  Psychiatric/Behavioral:  The patient is nervous/anxious.      Objective:    There were no vitals filed for this visit. There is no height or weight on file to calculate BMI.     10/25/2022   10:45 AM 04/24/2022    2:05 AM 10/20/2021   11:05 AM 08/22/2019    1:33 PM 06/12/2019   11:01 AM  Advanced Directives  Does Patient Have a Medical Advance Directive? No No No No No  Would patient like information on creating a medical advance directive? Yes (MAU/Ambulatory/Procedural Areas - Information given) No - Patient declined No - Patient declined  No - Patient declined    Current Medications (verified) Outpatient Encounter Medications as of 10/25/2022  Medication Sig   busPIRone (BUSPAR) 15 MG tablet Take 1/3 tablet p.o. twice daily for 1 week, then take 2/3 tablet p.o. twice daily for 1 week, then take 1 tablet p.o. twice daily   gabapentin (NEURONTIN) 300 MG capsule Take 1 capsule (300 mg total) by mouth 3 (three) times daily.   isosorbide mononitrate (IMDUR) 30 MG 24 hr tablet Take 30 mg by mouth daily.   lisinopril (ZESTRIL) 40 MG tablet Take by mouth.   LORazepam (ATIVAN) 0.5 MG tablet One by mouth up to three times daily for anxiety.- per psychiatrist   LORazepam (ATIVAN) 1 MG tablet Take  1 tablet (1 mg total) by mouth every 8 (eight) hours.   OLANZapine (ZYPREXA) 15 MG tablet Take 1 tablet (15 mg total) by mouth at bedtime.   rosuvastatin (CRESTOR) 40 MG tablet TAKE 1 TABLET BY MOUTH DAILY   sertraline (ZOLOFT) 100 MG tablet TAKE 2 TABLETS BY MOUTH EVERY DAY AS NEEDED   sildenafil (REVATIO) 20 MG tablet Take 2-3 tablets po 1-2 hrs before sex   SYRINGE-NEEDLE, DISP, 3 ML (B-D SYRINGE/NEEDLE 3CC/25GX5/8) 25G X 5/8" 3 ML MISC 1 each by Does not apply route every 14 (fourteen) days.   Testosterone Cypionate 200 MG/ML SOLN Inject 100 mg as directed every 14 (fourteen) days.   zolpidem (AMBIEN) 5 MG tablet TAKE 1 TABLET BY MOUTH AT BEDTIME AS NEEDED FOR SLEEP   No facility-administered encounter medications on file as of 10/25/2022.    Allergies (verified) Codeine   History: Past Medical History:  Diagnosis Date   Anxiety    CAD (coronary artery disease)    Depression    High cholesterol    Hypertension    Past Surgical History:  Procedure Laterality Date   COLONOSCOPY     CORONARY ANGIOPLASTY WITH STENT PLACEMENT  05/2018   Heart  Stent  2009   HERNIA REPAIR     Family History  Problem Relation Age of Onset   Heart attack Father    Hyperlipidemia Son    Social History   Socioeconomic History  Marital status: Married    Spouse name: Not on file   Number of children: 2   Years of education: 14   Highest education level: Associate degree: occupational, Scientist, product/process development, or vocational program  Occupational History   Occupation: retired    Associate Professor: Surveyor, minerals    Comment: welder  Tobacco Use   Smoking status: Never   Smokeless tobacco: Never  Vaping Use   Vaping Use: Never used  Substance and Sexual Activity   Alcohol use: No   Drug use: No   Sexual activity: Yes    Partners: Female    Birth control/protection: None    Comment: married  Other Topics Concern   Not on file  Social History Narrative   Married, lives at home with spouse. Multiple children  and grandchildren. 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 Active in local church. Likes to travel with spouse to the beach.    Social Determinants of Health   Financial Resource Strain: Low Risk  (10/20/2021)   Overall Financial Resource Strain (CARDIA)    Difficulty of Paying Living Expenses: Not hard at all  Food Insecurity: No Food Insecurity (10/20/2021)   Hunger Vital Sign    Worried About Running Out of Food in the Last Year: Never true    Ran Out of Food in the Last Year: Never true  Transportation Needs: No Transportation Needs (10/20/2021)   PRAPARE - Administrator, Civil Service (Medical): No    Lack of Transportation (Non-Medical): No  Physical Activity: Sufficiently Active (10/20/2021)   Exercise Vital Sign    Days of Exercise per Week: 7 days    Minutes of Exercise per Session: 60 min  Stress: No Stress Concern Present (10/20/2021)   [employer] of Occupational Health - Occupational Stress Questionnaire    Feeling of Stress : Not at all  Social Connections: Moderately Integrated (10/20/2021)   Social Connection and Isolation Panel [NHANES]    Frequency of Communication with Friends and Family: Three times a week    Frequency of Social Gatherings with Friends and Family: Three times a week    Attends Religious Services: More than 4 times per year    Active Member of Clubs or Organizations: No    Attends Banker Meetings: Never    Marital Status: Married    Tobacco Counseling Counseling given: Not Answered   Clinical Intake:  Pre-visit preparation completed: Yes  Pain : No/denies pain   How often do you need to have someone help you when you read instructions, pamphlets, or other written materials from your doctor or pharmacy?: 1 - Never  Diabetic?No   Activities of Daily Living     No data to display          Patient Care Team: Anabel Halon, MD as PCP - General (Internal Medicine)  Indicate any  recent Medical Services you may have received from other than [facility] providers in the past year (date may be approximate).     Assessment:   This is a routine wellness examination for Devin Daugherty.  Hearing/Vision screen No results found.  Dietary issues and exercise activities discussed:     Goals Addressed   None    Depression Screen    06/30/2022    2:34 PM 12/01/2021    1:44 PM 10/20/2021   11:05 AM 08/26/2021    3:07 PM 08/29/2020    5:38 PM 01/09/2019   11:23 AM 07/04/2017    8:47 AM  PHQ 2/9 Scores  PHQ - 2 Score 0 0 0 0 1 6 2   PHQ- 9 Score 0 0 0 0 4 17 4     Fall Risk    06/30/2022    2:34 PM 12/01/2021    1:44 PM 10/20/2021   11:05 AM 08/26/2021    3:07 PM 06/12/2019   11:02 AM  Fall Risk   Falls in the past year? 0 0 0 0 0  Number falls in past yr: 0 0 0 0 0  Injury with Fall? 0 0 0 0 0  Risk for fall due to : No Fall Risks No Fall Risks No Fall Risks No Fall Risks   Follow up Falls evaluation completed Falls evaluation completed Falls evaluation completed Falls evaluation completed     FALL RISK PREVENTION PERTAINING TO THE HOME:  Any stairs in or around the home? Yes  If so, are there any without handrails? Yes  Home free of loose throw rugs in walkways, pet beds, electrical cords, etc? Yes  Adequate lighting in your home to reduce risk of falls? Yes   ASSISTIVE DEVICES UTILIZED TO PREVENT FALLS:  Life alert? No  Use of a cane, walker or w/c? No  Grab bars in the bathroom? Yes  Shower chair or bench in shower? No  Elevated toilet seat or a handicapped toilet? No     Cognitive Function:    10/20/2021   11:05 AM  MMSE - Mini Mental State Exam  Not completed: Unable to complete        10/25/2022   10:49 AM 10/20/2021   11:05 AM  6CIT Screen  What Year? 0 points 0 points  What month? 0 points 0 points  What time? 0 points 0 points  Count back from 20 0 points 0 points  Months in reverse 0 points 0 points  Repeat phrase 10 points 0 points  Total  Score 10 points 0 points    Immunizations Immunization History  Administered Date(s) Administered   DTaP 02/13/2011   PFIZER(Purple Top)SARS-COV-2 Vaccination 01/16/2020, 02/07/2020   PNEUMOCOCCAL CONJUGATE-20 08/26/2021   Tdap 06/16/2022    TDAP status: Up to date  Flu Vaccine status: Up to date  Pneumococcal vaccine status: Up to date  Covid-19 vaccine status: Declined, Education has been provided regarding the importance of this vaccine but patient still declined. Advised may receive this vaccine at local pharmacy or Health Dept.or vaccine clinic. Aware to provide a copy of the vaccination record if obtained from local pharmacy or Health Dept. Verbalized acceptance and understanding.  Qualifies for Shingles Vaccine? Yes   Zostavax completed No   Shingrix Completed?: No.    Education has been provided regarding the importance of this vaccine. Patient has been advised to call insurance company to determine out of pocket expense if they have not yet received this vaccine. Advised may also receive vaccine at local pharmacy or Health Dept. Verbalized acceptance and understanding.  Screening Tests Health Maintenance  Topic Date Due   Zoster Vaccines- Shingrix (1 of 2) Never done   COVID-19 Vaccine (3 - Pfizer risk series) 03/06/2020   INFLUENZA VACCINE  Never done   Medicare Annual Wellness (AWV)  10/20/2022   COLONOSCOPY (Pts 45-26yrs Insurance coverage will need to be confirmed)  10/26/2023 (Originally 07/[birth year]/1994)   TETANUS/TDAP  06/16/2032   Pneumonia Vaccine 61+ Years old  Completed   Hepatitis C Screening  Completed   HPV VACCINES  Aged Out    Health Maintenance  Health Maintenance  Due  Topic Date Due   Zoster Vaccines- Shingrix (1 of 2) Never done   COVID-19 Vaccine (3 - Pfizer risk series) 03/06/2020   INFLUENZA VACCINE  Never done   Medicare Annual Wellness (AWV)  10/20/2022    Colorectal cancer screening: No longer required. Patient does not want any colon cancer  screening. Reviewed options.   Lung Cancer Screening: (Low Dose CT Chest recommended if Age 15-80 years, 30 pack-year currently smoking OR have quit w/in 15years.) does not qualify.    Additional Screening:  Hepatitis C Screening: does not qualify; Completed 06/30/2022  Vision Screening: Recommended annual ophthalmology exams for early detection of glaucoma and other disorders of the eye. Is the patient up to date with their annual eye exam?  No  Who is the provider or what is the name of the office in which the patient attends annual eye exams?  If pt is not established with a provider, would they like to be referred to a provider to establish care? No .   Dental Screening: Recommended annual dental exams for proper oral hygiene  Community Resource Referral / Chronic Care Management: CRR required this visit?  No   CCM required this visit?  No      Plan:     I have personally reviewed and noted the following in the patient's chart:   Medical and social history Use of alcohol, tobacco or illicit drugs  Current medications and supplements including opioid prescriptions. Patient is not currently taking opioid prescriptions. Functional ability and status Nutritional status Physical activity Advanced directives List of other physicians Hospitalizations, surgeries, and ER visits in previous 12 months Vitals Screenings to include cognitive, depression, and falls Referrals and appointments  In addition, I have reviewed and discussed with patient certain preventive protocols, quality metrics, and best practice recommendations. A written personalized care plan for preventive services as well as general preventive health recommendations were provided to patient.     Lorene Dy, MD   10/25/2022

## 2022-10-25 NOTE — Patient Instructions (Addendum)
  Mr. Devin Daugherty , Thank you for taking time to come for your Medicare Wellness Visit. I appreciate your ongoing commitment to your health goals. Please review the following plan we discussed and let me know if I can assist you in the future.   These are the goals we discussed: Continue to work with your doctors to have better control of your anxiety.   Flu Vaccine: You can walk in and receive flu vaccine on Wednesday mornings. If this time doesn't work, call and make an appointment.    This is a list of the screening recommended for you and due dates:  Health Maintenance  Topic Date Due   Zoster (Shingles) Vaccine (1 of 2) Never done   COVID-19 Vaccine (3 - Pfizer risk series) 03/06/2020   Flu Shot  Never done   Medicare Annual Wellness Visit  10/20/2022   Colon Cancer Screening  10/26/2023*   Tetanus Vaccine  06/16/2032   Pneumonia Vaccine  Completed   Hepatitis C Screening: USPSTF Recommendation to screen - Ages 18-79 yo.  Completed   HPV Vaccine  Aged Out  *Topic was postponed. The date shown is not the original due date.

## 2022-10-28 ENCOUNTER — Other Ambulatory Visit: Payer: Self-pay | Admitting: Internal Medicine

## 2022-10-30 ENCOUNTER — Other Ambulatory Visit: Payer: Self-pay | Admitting: Behavioral Health

## 2022-10-30 DIAGNOSIS — F411 Generalized anxiety disorder: Secondary | ICD-10-CM

## 2022-10-30 DIAGNOSIS — F410 Panic disorder [episodic paroxysmal anxiety] without agoraphobia: Secondary | ICD-10-CM

## 2022-11-03 ENCOUNTER — Encounter: Payer: Self-pay | Admitting: Behavioral Health

## 2022-11-03 ENCOUNTER — Ambulatory Visit (INDEPENDENT_AMBULATORY_CARE_PROVIDER_SITE_OTHER): Payer: Medicare Other | Admitting: Behavioral Health

## 2022-11-03 DIAGNOSIS — F99 Mental disorder, not otherwise specified: Secondary | ICD-10-CM | POA: Diagnosis not present

## 2022-11-03 DIAGNOSIS — F410 Panic disorder [episodic paroxysmal anxiety] without agoraphobia: Secondary | ICD-10-CM | POA: Diagnosis not present

## 2022-11-03 DIAGNOSIS — F5105 Insomnia due to other mental disorder: Secondary | ICD-10-CM | POA: Diagnosis not present

## 2022-11-03 DIAGNOSIS — F411 Generalized anxiety disorder: Secondary | ICD-10-CM | POA: Diagnosis not present

## 2022-11-03 MED ORDER — ZOLPIDEM TARTRATE 10 MG PO TABS: 10.0000 mg | ORAL_TABLET | Freq: Every evening | ORAL | 3 refills | Status: DC | PRN

## 2022-11-03 MED ORDER — LORAZEPAM 1 MG PO TABS: 1.0000 mg | ORAL_TABLET | Freq: Three times a day (TID) | ORAL | 3 refills | Status: DC

## 2022-11-03 NOTE — Progress Notes (Signed)
[facility]  Patient ID: Devin Daugherty,  MRN: [id number]  PCP: Lindell Spar, MD  Date of Evaluation: 11/03/2022 Time spent:30 minutes  Chief Complaint:  Chief Complaint   Medication Problem; Follow-up; Medication Refill; Patient Education; Depression; Anxiety     HISTORY/CURRENT STATUS: HPI  71 year old male presents to this office for follow up and medication management.  His wife is present during interview with his consent. He is more relaxed this visit, engaged, and calm. Says that he has been "doing ok" since last visit with anxiety and believes Buspar has helped some. He understands that he has a confirmed dx of Parkinson's disease. He  understands that there is no cure and is progressive but experience is individualistic for different people. He reports anxiety at 3/10 and depression at 3/10. Says he sleeps 6-7 hours per night. He wants to follow up in 3 months  No mania, no psychosis. No SI/HI. To continue to follow up with neurology.   Past psychiatric medication trials: Prozac Paxil  Ingrezza Risperdal Zoloft Ambien Mirtazapine-Not really working anymore Gabapentin Trazodone- Ineffective Viibryd-anger, irritability     Individual Medical History/ Review of Systems: Changes? :No   Allergies: Codeine  Current Medications:  Current Outpatient Medications:    zolpidem (AMBIEN) 10 MG tablet, Take 1 tablet (10 mg total) by mouth at bedtime as needed for sleep., Disp: 30 tablet, Rfl: 3   busPIRone (BUSPAR) 15 MG tablet, TAKE 1 TABLET BY MOUTH TWICE DAILY, Disp: 60 tablet, Rfl: 1   gabapentin (NEURONTIN) 300 MG capsule, Take 1 capsule (300 mg total) by mouth 3 (three) times daily., Disp: 90 capsule, Rfl: 3   isosorbide mononitrate (IMDUR) 30 MG 24 hr tablet, Take 30 mg by mouth daily., Disp: , Rfl:    isosorbide mononitrate (IMDUR) 60 MG 24 hr tablet, TAKE 1 TABLET BY MOUTH DAILY, Disp: 90 tablet, Rfl: 3   lisinopril (ZESTRIL) 40 MG tablet, Take by  mouth., Disp: , Rfl:    LORazepam (ATIVAN) 0.5 MG tablet, One by mouth up to three times daily for anxiety.- per psychiatrist, Disp: 90 tablet, Rfl: 5   LORazepam (ATIVAN) 1 MG tablet, Take 1 tablet (1 mg total) by mouth every 8 (eight) hours., Disp: 90 tablet, Rfl: 3   OLANZapine (ZYPREXA) 15 MG tablet, Take 1 tablet (15 mg total) by mouth at bedtime., Disp: 30 tablet, Rfl: 5   rosuvastatin (CRESTOR) 40 MG tablet, TAKE 1 TABLET BY MOUTH DAILY, Disp: 90 tablet, Rfl: 3   sertraline (ZOLOFT) 100 MG tablet, TAKE 2 TABLETS BY MOUTH EVERY DAY AS NEEDED, Disp: 180 tablet, Rfl: 3   sildenafil (REVATIO) 20 MG tablet, Take 2-3 tablets po 1-2 hrs before sex, Disp: 24 tablet, Rfl: 5   SYRINGE-NEEDLE, DISP, 3 ML (B-D SYRINGE/NEEDLE 3CC/25GX5/8) 25G X 5/8" 3 ML MISC, 1 each by Does not apply route every 14 (fourteen) days., Disp: 50 each, Rfl: 0   Testosterone Cypionate 200 MG/ML SOLN, Inject 100 mg as directed every 14 (fourteen) days., Disp: 2 mL, Rfl: 1   zolpidem (AMBIEN) 5 MG tablet, TAKE 1 TABLET BY MOUTH AT BEDTIME AS NEEDED FOR SLEEP, Disp: 30 tablet, Rfl: 0 Medication Side Effects: none  Family Medical/ Social History: Changes? No  MENTAL HEALTH EXAM:  There were no vitals taken for this visit.There is no height or weight on file to calculate BMI.  General Appearance: Casual, Neat, and Well Groomed  Eye Contact:  Good  Speech:  Clear and Coherent  Volume:  Normal  Mood:  Anxious  Affect:  Appropriate  Thought Process:  Coherent  Orientation:  Full (Time, Place, and Person)  Thought Content: Logical   Suicidal Thoughts:  No  Homicidal Thoughts:  No  Memory:  WNL  Judgement:  Good  Insight:  Good  Psychomotor Activity:  Normal  Concentration:  Concentration: Fair  Recall:  Fair  Fund of Knowledge: Fair  Language: Good  Assets:  Desire for Improvement Physical Health Resilience Social Support  ADL's:  Impaired  Cognition: WNL  Prognosis:  Poor    DIAGNOSES:    ICD-10-CM   1.  Insomnia due to other mental disorder  F51.05 zolpidem (AMBIEN) 10 MG tablet   F99     2. Generalized anxiety disorder  F41.1 LORazepam (ATIVAN) 1 MG tablet    3. Panic disorder  F41.0 LORazepam (ATIVAN) 1 MG tablet    4. Anxiety state  F41.1 LORazepam (ATIVAN) 1 MG tablet      Receiving Psychotherapy: No    RECOMMENDATIONS:   Greater than 50% of 30  min. face to face time with patient was spent on counseling and coordination of care. We discussed his current state of stability. We discussed Zyprexa and its significant reduction of anxiety the first couple of months of taking. He feels like the Buspar is helping supplement the Zyprexa for anxiety. Discussed his confirmed dx of Parkinsons. Reinforced patient education on disease process. Advised patient to consult Neurology with question about his Parkinson medication or concerns with drug to drug interaction. He said his neurologist cleared him to take Carbidopa-Levodopa with his Zyprexa.  Will continue  Ativan 1 mg 3 times daily. At this point, the benefit outweighs the risk. Patient has not responded well to other medications.  I reinforced long term risk with him and his wife present.   I explained to patient today that I felt that we were reaching the point about my confidence to be able to help his chronic anxiety with medication. He understands he has a long and complex hx of medication trials and nothing has been successful long term. His wife complains that he is too lethargic now from medication effects.  Patient will continue to follow up with neurology.  We decided to; Will continue Zyprexa 15 mg daily To continue Gabapentin 300 mg tablet three times daily (900 mg total). Discussed potential benefits, risks, and side effects of BuSpar.  Patient agrees to trial of BuSpar.  Will start BuSpar 15 mg 1/3 tablet twice daily for 1 week, then increase to 2/3 tablet twice daily for 1 week, then increase to 1 tablet twice daily for anxiety.   Continue Ativan 1 mg three times daily as needed Continued the Zoloft 200 mg daily Continue Ambien 5 mg daily at bedtime Will report and side effects or worsening Discussed potential metabolic side effects associated with atypical antipsychotics, as well as potential risk for movement side effects. Advised pt to contact office if movement side effects occur.   To follow up in 4 weeks to reassess  Pt is to continue f/u with neurology as needed.  Reviewed PDMP  Joan Flores, NP

## 2022-11-09 ENCOUNTER — Telehealth: Payer: Self-pay | Admitting: Behavioral Health

## 2022-11-09 NOTE — Telephone Encounter (Signed)
Please review

## 2022-11-09 NOTE — Telephone Encounter (Signed)
Pt's wife Lvm @ 9:25a.  She said that pt went to Neurologist and asked he could take the meds prescribed by the neurologist as well as the meds from [place].  The neurologist told him no, so he has decided to keep taking the meds from [facility].  Pt's wife said she was told by [name] to call back and let [name] know their decision.  Next appt 2/8

## 2022-11-15 ENCOUNTER — Other Ambulatory Visit: Payer: Self-pay | Admitting: Psychiatry

## 2022-11-15 DIAGNOSIS — G4700 Insomnia, unspecified: Secondary | ICD-10-CM

## 2022-11-22 ENCOUNTER — Other Ambulatory Visit: Payer: Self-pay | Admitting: Behavioral Health

## 2022-11-22 DIAGNOSIS — F411 Generalized anxiety disorder: Secondary | ICD-10-CM

## 2022-11-22 DIAGNOSIS — F410 Panic disorder [episodic paroxysmal anxiety] without agoraphobia: Secondary | ICD-10-CM

## 2022-11-22 NOTE — Telephone Encounter (Signed)
Due 12/5

## 2022-12-12 ENCOUNTER — Other Ambulatory Visit: Payer: Self-pay | Admitting: Behavioral Health

## 2022-12-12 DIAGNOSIS — F410 Panic disorder [episodic paroxysmal anxiety] without agoraphobia: Secondary | ICD-10-CM

## 2022-12-12 DIAGNOSIS — F411 Generalized anxiety disorder: Secondary | ICD-10-CM

## 2023-01-03 ENCOUNTER — Telehealth: Payer: Self-pay | Admitting: Behavioral Health

## 2023-01-03 NOTE — Telephone Encounter (Signed)
Noted I will discuss with Aaron Edelman

## 2023-01-03 NOTE — Telephone Encounter (Signed)
Wife stated he has not been doing well since christmas.He has been having a lot of anxiety and the buspar 15 mg BID has not been helpful.The ativan has not been very helpful either.Please advise.He had to stop the olanzapine due to parkinson's disease.

## 2023-01-03 NOTE — Telephone Encounter (Signed)
Wife called and said that Devin Daugherty's anxiety is so bad that he won't get get out of the recliner due to his anxiety. They are wanting to see if Devin Daugherty is able to increase Devin Daugherty's buspar since it was working and now it is not.

## 2023-01-04 ENCOUNTER — Ambulatory Visit: Payer: Medicare Other | Admitting: Behavioral Health

## 2023-01-04 ENCOUNTER — Other Ambulatory Visit: Payer: Self-pay | Admitting: Behavioral Health

## 2023-01-04 DIAGNOSIS — F411 Generalized anxiety disorder: Secondary | ICD-10-CM

## 2023-01-04 MED ORDER — BUSPIRONE HCL 30 MG PO TABS: 30.0000 mg | ORAL_TABLET | Freq: Two times a day (BID) | ORAL | 3 refills | Status: DC

## 2023-01-04 NOTE — Telephone Encounter (Signed)
Spoke with Manuela Schwartz and she understands Ronalee Belts needs to try the increase in Buspar first and no apt needed today. Instructed her to call back with an update or any further concerns.   Will cancel his apt.

## 2023-01-04 NOTE — Telephone Encounter (Signed)
Devin Daugherty informed.

## 2023-01-11 ENCOUNTER — Other Ambulatory Visit: Payer: Self-pay | Admitting: Behavioral Health

## 2023-01-11 DIAGNOSIS — G4700 Insomnia, unspecified: Secondary | ICD-10-CM

## 2023-01-11 DIAGNOSIS — F410 Panic disorder [episodic paroxysmal anxiety] without agoraphobia: Secondary | ICD-10-CM

## 2023-01-11 DIAGNOSIS — F331 Major depressive disorder, recurrent, moderate: Secondary | ICD-10-CM

## 2023-01-11 DIAGNOSIS — F411 Generalized anxiety disorder: Secondary | ICD-10-CM

## 2023-01-12 ENCOUNTER — Encounter: Payer: Self-pay | Admitting: Behavioral Health

## 2023-01-12 ENCOUNTER — Ambulatory Visit (INDEPENDENT_AMBULATORY_CARE_PROVIDER_SITE_OTHER): Payer: Medicare Other | Admitting: Behavioral Health

## 2023-01-12 DIAGNOSIS — F331 Major depressive disorder, recurrent, moderate: Secondary | ICD-10-CM | POA: Diagnosis not present

## 2023-01-12 DIAGNOSIS — F410 Panic disorder [episodic paroxysmal anxiety] without agoraphobia: Secondary | ICD-10-CM

## 2023-01-12 DIAGNOSIS — F411 Generalized anxiety disorder: Secondary | ICD-10-CM | POA: Diagnosis not present

## 2023-01-12 DIAGNOSIS — F1320 Sedative, hypnotic or anxiolytic dependence, uncomplicated: Secondary | ICD-10-CM

## 2023-01-12 NOTE — Progress Notes (Signed)
[facility]  Patient ID: Devin Daugherty,  MRN: [id number]  PCP: Lindell Spar, MD  Date of Evaluation: 01/12/2023 Time spent:40 minutes  Chief Complaint:  Chief Complaint   Depression; Anxiety; Follow-up; Medication Problem; Patient Education; Stress     HISTORY/CURRENT STATUS: HPI  "Devin Daugherty", 72 year old male presents to this office for follow up and medication management.  His wife is present during interview with his consent. He has more pronounce tremors in both upper extremities than last visit. He is slight agitated that I cannot provide him with a sedative. Wife says she and kids are discouraged by his sedation level all the time and the do not want Devin Daugherty to take more medication that may be sedating. They says he has already experienced falls in the home. Wife says he is addicted to the Ativan. Says he would like to go back on the Olanzapine, but needs something because nothing is working for his anxiety. He understands that he has a confirmed dx of Parkinson's disease and it is progressive.  Wife says she is seeing more changes with memory and irrational behaviors. He  understands that there is no cure and is progressive but experience is individualistic for different people. Wife says she will be taking him with f/u with neurology soon. He reports anxiety at 6/10 and depression at 3/10. Says he sleeps 6-7 hours per night. He wants to follow up in 4 weeks to reassess.  No mania, no psychosis. No SI/HI. To continue to follow up with neurology.   Past psychiatric medication trials: Prozac Paxil  Ingrezza Risperdal Zoloft Ambien Mirtazapine-Not really working anymore Gabapentin Trazodone- Ineffective Viibryd-anger, irritability  Individual Medical History/ Review of Systems: Changes? :No   Allergies: Codeine  Current Medications:  Current Outpatient Medications:    busPIRone (BUSPAR) 15 MG tablet, TAKE 1 TABLET BY MOUTH TWICE DAILY, Disp: 60 tablet, Rfl: 1    busPIRone (BUSPAR) 30 MG tablet, Take 1 tablet (30 mg total) by mouth 2 (two) times daily., Disp: 60 tablet, Rfl: 3   gabapentin (NEURONTIN) 300 MG capsule, TAKE ONE CAPSULE BY MOUTH THREE TIMES DAILY, Disp: 90 capsule, Rfl: 0   isosorbide mononitrate (IMDUR) 30 MG 24 hr tablet, Take 30 mg by mouth daily., Disp: , Rfl:    isosorbide mononitrate (IMDUR) 60 MG 24 hr tablet, TAKE 1 TABLET BY MOUTH DAILY, Disp: 90 tablet, Rfl: 3   lisinopril (ZESTRIL) 40 MG tablet, Take by mouth., Disp: , Rfl:    LORazepam (ATIVAN) 0.5 MG tablet, One by mouth up to three times daily for anxiety.- per psychiatrist, Disp: 90 tablet, Rfl: 5   LORazepam (ATIVAN) 1 MG tablet, TAKE 1 TABLET BY MOUTH EVERY 8 HOURS, Disp: 90 tablet, Rfl: 3   OLANZapine (ZYPREXA) 15 MG tablet, Take 1 tablet (15 mg total) by mouth at bedtime., Disp: 30 tablet, Rfl: 5   rosuvastatin (CRESTOR) 40 MG tablet, TAKE 1 TABLET BY MOUTH DAILY, Disp: 90 tablet, Rfl: 3   sertraline (ZOLOFT) 100 MG tablet, TAKE 2 TABLETS BY MOUTH EVERY DAY AS NEEDED, Disp: 180 tablet, Rfl: 3   sildenafil (REVATIO) 20 MG tablet, Take 2-3 tablets po 1-2 hrs before sex, Disp: 24 tablet, Rfl: 5   SYRINGE-NEEDLE, DISP, 3 ML (B-D SYRINGE/NEEDLE 3CC/25GX5/8) 25G X 5/8" 3 ML MISC, 1 each by Does not apply route every 14 (fourteen) days., Disp: 50 each, Rfl: 0   Testosterone Cypionate 200 MG/ML SOLN, Inject 100 mg as directed every 14 (fourteen) days., Disp: 2 mL,  Rfl: 1   zolpidem (AMBIEN) 10 MG tablet, Take 1 tablet (10 mg total) by mouth at bedtime as needed for sleep., Disp: 30 tablet, Rfl: 3   zolpidem (AMBIEN) 5 MG tablet, TAKE 1 TABLET BY MOUTH AT BEDTIME AS NEEDED FOR SLEEP, Disp: 30 tablet, Rfl: 0 Medication Side Effects: none  Family Medical/ Social History: Changes? No  MENTAL HEALTH EXAM:  There were no vitals taken for this visit.There is no height or weight on file to calculate BMI.  General Appearance: Casual, Neat, and Well Groomed  Eye Contact:  Good  Speech:   Clear and Coherent  Volume:  Normal  Mood:  Anxious, Depressed, and Dysphoric  Affect:  Congruent, Depressed, Flat, and Anxious  Thought Process:  Coherent  Orientation:  Full (Time, Place, and Person)  Thought Content: Rumination   Suicidal Thoughts:  No  Homicidal Thoughts:  No  Memory:  Impaired due to Parkinson  Judgement:  Fair  Insight:  Fair  Psychomotor Activity:  Tremor  Concentration:  Concentration: Fair  Recall:  [employer] of Knowledge: Fair  Language: Fair  Assets:  Desire for Improvement Physical Health Resilience Social Support  ADL's:  Impaired  Cognition: Impaired,  Mild  Prognosis:  Poor    DIAGNOSES: No diagnosis found.  Receiving Psychotherapy: No    RECOMMENDATIONS:   Greater than 50% of 30  min. face to face time with patient was spent on counseling and coordination of care. We discussed his current state of stability. We discussed neurology stopping Zyprexa  and educated him on reasons why.  Reinforced patient education on disease process.  He understand that it is progressive and cognitive decline my continue and present in various ways, memory loss, changes in behavior. Advised patient to consult Neurology with question about his Parkinson medication or concerns with drug to drug interaction.  Will continue  Ativan 1 mg 3 times daily. At this point, the benefit outweighs the risk. Patient has not responded well to other medications.  I reinforced long term risk with him and his wife present.   I reinforced explained to patient today that I felt that we were reaching the point about my confidence to be able to help his chronic anxiety with medication.  Family agrees that they do not want him taking more medication and is aware of risk of increased benzo's. He understands he has a long and complex hx of medication trials and nothing has been successful long term. His wife complains that he is too lethargic now from medication effects.  Patient will continue  to follow up with neurology.  We decided to; Will continue Zyprexa 15 mg daily To reduce Gabapentin 300 mg tablet to two times daily (600 mg total) to see if it help with mental fogginess and sedation during the day.  Discussed potential benefits, risks, and side effects of BuSpar.  Patient agrees to trial of BuSpar.  Will start BuSpar 15 mg 1/3 tablet twice daily for 1 week, then increase to 2/3 tablet twice daily for 1 week, then increase to 1 tablet twice daily for anxiety.  Continue Ativan 1 mg three times daily as needed Continued the Zoloft 200 mg daily Continue Ambien 5 mg daily at bedtime Will report and side effects or worsening Discussed potential metabolic side effects associated with atypical antipsychotics, as well as potential risk for movement side effects. Advised pt to contact office if movement side effects occur.   To follow up in 4 weeks to reassess  Pt is to continue f/u with neurology as needed.  Reviewed PDMP    Elwanda Brooklyn, NP

## 2023-01-19 DIAGNOSIS — I10 Essential (primary) hypertension: Secondary | ICD-10-CM | POA: Diagnosis not present

## 2023-01-19 DIAGNOSIS — F419 Anxiety disorder, unspecified: Secondary | ICD-10-CM | POA: Diagnosis not present

## 2023-01-19 DIAGNOSIS — E782 Mixed hyperlipidemia: Secondary | ICD-10-CM | POA: Diagnosis not present

## 2023-01-19 DIAGNOSIS — I2510 Atherosclerotic heart disease of native coronary artery without angina pectoris: Secondary | ICD-10-CM | POA: Diagnosis not present

## 2023-01-19 DIAGNOSIS — G20C Parkinsonism, unspecified: Secondary | ICD-10-CM | POA: Diagnosis not present

## 2023-01-19 DIAGNOSIS — R001 Bradycardia, unspecified: Secondary | ICD-10-CM | POA: Diagnosis not present

## 2023-01-19 DIAGNOSIS — Z955 Presence of coronary angioplasty implant and graft: Secondary | ICD-10-CM | POA: Diagnosis not present

## 2023-02-03 ENCOUNTER — Ambulatory Visit: Payer: Medicare Other | Admitting: Behavioral Health

## 2023-02-04 ENCOUNTER — Encounter: Payer: Self-pay | Admitting: Behavioral Health

## 2023-02-04 ENCOUNTER — Ambulatory Visit: Payer: Medicare Other | Admitting: Behavioral Health

## 2023-02-04 VITALS — Wt 192.0 lb

## 2023-02-04 DIAGNOSIS — F331 Major depressive disorder, recurrent, moderate: Secondary | ICD-10-CM | POA: Diagnosis not present

## 2023-02-04 DIAGNOSIS — F411 Generalized anxiety disorder: Secondary | ICD-10-CM | POA: Diagnosis not present

## 2023-02-04 DIAGNOSIS — F5105 Insomnia due to other mental disorder: Secondary | ICD-10-CM

## 2023-02-04 DIAGNOSIS — R630 Anorexia: Secondary | ICD-10-CM

## 2023-02-04 DIAGNOSIS — F99 Mental disorder, not otherwise specified: Secondary | ICD-10-CM

## 2023-02-04 MED ORDER — MIRTAZAPINE 15 MG PO TABS: 15.0000 mg | ORAL_TABLET | Freq: Every day | ORAL | 1 refills | Status: DC

## 2023-02-04 MED ORDER — ESCITALOPRAM OXALATE 10 MG PO TABS: 10.0000 mg | ORAL_TABLET | Freq: Every day | ORAL | 1 refills | Status: DC

## 2023-02-04 NOTE — Progress Notes (Signed)
[facility]  Patient ID: Devin Daugherty,  MRN: [id number]  PCP: Lindell Spar, MD  Date of Evaluation: 02/04/2023 Time spent:45 minutes  Chief Complaint:   HISTORY/CURRENT STATUS: HPI  "Devin Daugherty", 72 year old male presents to this office for follow up and medication management.  His wife is present during interview with his consent. He has more pronounce tremors in both upper extremities than last visit. He is still very withdrawn and flat. Noticeable weight loss and does not look well.  Reports having gastric pain in primarily in the morning. Wife says he is barely eating. He has no breakfast and barely eats throughout the day. Says he has lost 15 lbs in a month.  Wife says she is seeing more changes with memory and irrational behaviors. Says he is staying in recliner the whole day and not wanting to do anything. Says that she will get appt with PCP soon. He reports anxiety at 6/10 and depression at 3/10. Says he sleeps 5-7 hours per night. He wants to follow up in 4 weeks to reassess.  No mania, no psychosis. No SI/HI. To continue to follow up with neurology.   Past psychiatric medication trials: Prozac Paxil  Ingrezza Risperdal Zoloft Ambien Mirtazapine-Not really working anymore Gabapentin Trazodone- Ineffective Viibryd-anger, irritability     Individual Medical History/ Review of Systems: Changes? :No   Allergies: Codeine  Current Medications:  Current Outpatient Medications:    escitalopram (LEXAPRO) 10 MG tablet, Take 1 tablet (10 mg total) by mouth daily., Disp: 30 tablet, Rfl: 1   mirtazapine (REMERON) 15 MG tablet, Take 1 tablet (15 mg total) by mouth at bedtime., Disp: 90 tablet, Rfl: 1   busPIRone (BUSPAR) 15 MG tablet, TAKE 1 TABLET BY MOUTH TWICE DAILY, Disp: 60 tablet, Rfl: 1   busPIRone (BUSPAR) 30 MG tablet, Take 1 tablet (30 mg total) by mouth 2 (two) times daily., Disp: 60 tablet, Rfl: 3   gabapentin (NEURONTIN) 300 MG capsule, TAKE ONE CAPSULE  BY MOUTH THREE TIMES DAILY, Disp: 90 capsule, Rfl: 0   isosorbide mononitrate (IMDUR) 30 MG 24 hr tablet, Take 30 mg by mouth daily., Disp: , Rfl:    isosorbide mononitrate (IMDUR) 60 MG 24 hr tablet, TAKE 1 TABLET BY MOUTH DAILY, Disp: 90 tablet, Rfl: 3   lisinopril (ZESTRIL) 40 MG tablet, Take by mouth., Disp: , Rfl:    LORazepam (ATIVAN) 0.5 MG tablet, One by mouth up to three times daily for anxiety.- per psychiatrist, Disp: 90 tablet, Rfl: 5   LORazepam (ATIVAN) 1 MG tablet, TAKE 1 TABLET BY MOUTH EVERY 8 HOURS, Disp: 90 tablet, Rfl: 3   OLANZapine (ZYPREXA) 15 MG tablet, Take 1 tablet (15 mg total) by mouth at bedtime., Disp: 30 tablet, Rfl: 5   rosuvastatin (CRESTOR) 40 MG tablet, TAKE 1 TABLET BY MOUTH DAILY, Disp: 90 tablet, Rfl: 3   sertraline (ZOLOFT) 100 MG tablet, TAKE 2 TABLETS BY MOUTH EVERY DAY AS NEEDED, Disp: 180 tablet, Rfl: 3   sildenafil (REVATIO) 20 MG tablet, Take 2-3 tablets po 1-2 hrs before sex, Disp: 24 tablet, Rfl: 5   SYRINGE-NEEDLE, DISP, 3 ML (B-D SYRINGE/NEEDLE 3CC/25GX5/8) 25G X 5/8" 3 ML MISC, 1 each by Does not apply route every 14 (fourteen) days., Disp: 50 each, Rfl: 0   Testosterone Cypionate 200 MG/ML SOLN, Inject 100 mg as directed every 14 (fourteen) days., Disp: 2 mL, Rfl: 1   zolpidem (AMBIEN) 10 MG tablet, Take 1 tablet (10 mg total) by mouth at  bedtime as needed for sleep., Disp: 30 tablet, Rfl: 3   zolpidem (AMBIEN) 5 MG tablet, TAKE 1 TABLET BY MOUTH AT BEDTIME AS NEEDED FOR SLEEP, Disp: 30 tablet, Rfl: 0 Medication Side Effects: none  Family Medical/ Social History: Changes? No  MENTAL HEALTH EXAM:  There were no vitals taken for this visit.There is no height or weight on file to calculate BMI.  General Appearance: Casual, Neat, and Well Groomed  Eye Contact:  Good  Speech:  Pressured  Volume:  Decreased  Mood:  Anxious, Depressed, and Dysphoric  Affect:  Depressed, Flat, Restricted, and Anxious  Thought Process:  Coherent  Orientation:   Full (Time, Place, and Person)  Thought Content: Logical   Suicidal Thoughts:  No  Homicidal Thoughts:  No  Memory:  WNL  Judgement:  Good  Insight:  Good  Psychomotor Activity:  Normal  Concentration:  Concentration: Good  Recall:  [place] of Knowledge: Fair  Language: Fair  Assets:  Desire for Improvement Physical Health Resilience Social Support  ADL's:  Intact  Cognition: WNL  Prognosis:  Fair    DIAGNOSES:    ICD-10-CM   1. Insomnia due to other mental disorder  F51.05 mirtazapine (REMERON) 15 MG tablet   F99     2. Poor appetite  R63.0 mirtazapine (REMERON) 15 MG tablet    3. Major depressive disorder, recurrent episode, moderate (HCC)  F33.1 escitalopram (LEXAPRO) 10 MG tablet    4. Anxiety state  F41.1 escitalopram (LEXAPRO) 10 MG tablet      Receiving Psychotherapy: No    RECOMMENDATIONS:   Greater than 50% of 30  min. face to face time with patient was spent on counseling and coordination of care. We discussed his current state of stability. I reinforced again why he was not able to take an anti psychotic like Zyprexa with his Parkinson's medication. He was asking me to prescribe a muscle relaxer and I explained that this was out of my scope. His wife is persistent that he does not take his meds as scheduled throughout the day. His wife also reports that his diet is poor and not eating much throughout the day. He look sick this visit. She says he has lost 15 lbs over the last month. We discuss patients report of stomach pain or discomfort in the morning when he wakes up. I highly encouraged him to follow up with is PCP asap.   Will continue  Ativan 1 mg 3 times daily. At this point, the benefit outweighs the risk. Patient has not responded well to other medications.  I reinforced long term risk with him and his wife present.   I reinforced explained to patient today that I felt that we were reaching the point about my confidence to be able to help his chronic  anxiety with medication.  Family agrees that they do not want him taking more medication and is aware of risk of increased benzo's. He understands he has a long and complex hx of medication trials and nothing has been successful long term. His wife complains that he is too lethargic now from medication effects.  Patient will continue to follow up with neurology.  We decided to; To continue Gabapentin 300 mg daily. He over the last 30 days reduced  from 900 mg daily due to increased mental fogginess and fatigue. Continue Buspar 30 mg twice daily. Pt reports he was only taking once daily.  Continue Ativan 1 mg three times daily as needed To reduce  Zoloft 100 mg daily to 50 mg daily for one week and then stop. Will start Lexapro 10 mg daily after breakfast To start Mirtazapine 15 mg  to assist with sleep, poor appetite. Take at bedtime. Continue Ambien 5 mg daily at bedtime Will report and side effects or worsening To follow up with PCP promptly for gastric pain. To follow up in 6 weeks to reassess  Pt is to continue f/u with neurology as needed.  Reviewed PDMP         Elwanda Brooklyn, NP

## 2023-02-08 ENCOUNTER — Ambulatory Visit: Payer: Medicare Other | Admitting: Behavioral Health

## 2023-02-09 ENCOUNTER — Emergency Department (HOSPITAL_COMMUNITY)
Admission: EM | Admit: 2023-02-09 | Discharge: 2023-02-09 | Disposition: A | Discharge status: Home / Self Care | Payer: Medicare Other | Attending: Emergency Medicine | Admitting: Emergency Medicine

## 2023-02-09 ENCOUNTER — Encounter (HOSPITAL_COMMUNITY): Payer: Self-pay

## 2023-02-09 ENCOUNTER — Other Ambulatory Visit: Payer: Self-pay

## 2023-02-09 ENCOUNTER — Other Ambulatory Visit: Payer: Self-pay | Admitting: Behavioral Health

## 2023-02-09 DIAGNOSIS — I10 Essential (primary) hypertension: Secondary | ICD-10-CM | POA: Insufficient documentation

## 2023-02-09 DIAGNOSIS — F410 Panic disorder [episodic paroxysmal anxiety] without agoraphobia: Secondary | ICD-10-CM

## 2023-02-09 DIAGNOSIS — I2510 Atherosclerotic heart disease of native coronary artery without angina pectoris: Secondary | ICD-10-CM | POA: Insufficient documentation

## 2023-02-09 DIAGNOSIS — F411 Generalized anxiety disorder: Secondary | ICD-10-CM

## 2023-02-09 DIAGNOSIS — R072 Precordial pain: Secondary | ICD-10-CM | POA: Diagnosis not present

## 2023-02-09 DIAGNOSIS — F419 Anxiety disorder, unspecified: Secondary | ICD-10-CM | POA: Insufficient documentation

## 2023-02-09 DIAGNOSIS — Z79899 Other long term (current) drug therapy: Secondary | ICD-10-CM | POA: Insufficient documentation

## 2023-02-09 DIAGNOSIS — R1013 Epigastric pain: Secondary | ICD-10-CM | POA: Diagnosis not present

## 2023-02-09 DIAGNOSIS — G4700 Insomnia, unspecified: Secondary | ICD-10-CM

## 2023-02-09 DIAGNOSIS — F331 Major depressive disorder, recurrent, moderate: Secondary | ICD-10-CM

## 2023-02-09 LAB — CBC WITH DIFFERENTIAL/PLATELET
Abs Immature Granulocytes: 0.02 10*3/uL (ref 0.00–0.07)
Basophils Absolute: 0.1 10*3/uL (ref 0.0–0.1)
Basophils Relative: 1 %
Eosinophils Absolute: 0 10*3/uL (ref 0.0–0.5)
Eosinophils Relative: 0 %
HCT: 45.1 % (ref 39.0–52.0)
Hemoglobin: 15.5 g/dL (ref 13.0–17.0)
Immature Granulocytes: 0 %
Lymphocytes Relative: 24 %
Lymphs Abs: 2.4 10*3/uL (ref 0.7–4.0)
MCH: 29.2 pg (ref 26.0–34.0)
MCHC: 34.4 g/dL (ref 30.0–36.0)
MCV: 84.9 fL (ref 80.0–100.0)
Monocytes Absolute: 1 10*3/uL (ref 0.1–1.0)
Monocytes Relative: 10 %
Neutro Abs: 6.6 10*3/uL (ref 1.7–7.7)
Neutrophils Relative %: 65 %
Platelets: 352 10*3/uL (ref 150–400)
RBC: 5.31 MIL/uL (ref 4.22–5.81)
RDW: 12.6 % (ref 11.5–15.5)
WBC: 10.1 10*3/uL (ref 4.0–10.5)
nRBC: 0 % (ref 0.0–0.2)

## 2023-02-09 LAB — COMPREHENSIVE METABOLIC PANEL
ALT: 27 U/L (ref 0–44)
AST: 26 U/L (ref 15–41)
Albumin: 4.2 g/dL (ref 3.5–5.0)
Alkaline Phosphatase: 79 U/L (ref 38–126)
Anion gap: 11 (ref 5–15)
BUN: 13 mg/dL (ref 8–23)
CO2: 22 mmol/L (ref 22–32)
Calcium: 9.4 mg/dL (ref 8.9–10.3)
Chloride: 99 mmol/L (ref 98–111)
Creatinine, Ser: 1.32 mg/dL — ABNORMAL HIGH (ref 0.61–1.24)
GFR, Estimated: 57 mL/min — ABNORMAL LOW (ref >60)
Glucose, Bld: 109 mg/dL — ABNORMAL HIGH (ref 70–99)
Potassium: 4.3 mmol/L (ref 3.5–5.1)
Sodium: 132 mmol/L — ABNORMAL LOW (ref 135–145)
Total Bilirubin: 0.6 mg/dL (ref 0.3–1.2)
Total Protein: 7.8 g/dL (ref 6.5–8.1)

## 2023-02-09 LAB — TROPONIN I (HIGH SENSITIVITY)
Troponin I (High Sensitivity): 7 ng/L (ref <18)
Troponin I (High Sensitivity): 7 ng/L (ref <18)

## 2023-02-09 LAB — LIPASE, BLOOD: Lipase: 56 U/L — ABNORMAL HIGH (ref 11–51)

## 2023-02-09 MED ORDER — MIDAZOLAM HCL 5 MG/5ML IJ SOLN
1.0000 mg | Freq: Once | INTRAMUSCULAR | Status: AC
  Administered 2023-02-09: 1 mg via INTRAVENOUS
  Filled 2023-02-09: qty 5

## 2023-02-09 NOTE — Discharge Instructions (Addendum)
All the results in the ER are normal, labs and imaging.  EKG, cardiac enzymes are normal.  Liver function, kidney function is normal.  Sodium is 132, which is only slightly low.  The workup in the ER is not complete, and is limited to screening for life threatening and emergent conditions only. We are not sure what is causing your symptoms.  We recommend that you follow-up with your primary care doctor later this week as planned.  We also recommend that you see your cardiologist for the chest pain within 2 weeks.  Please return to the ER if you have worsening chest pain, shortness of breath, pain radiating to your jaw, shoulder, or back, sweats or fainting. Otherwise see the Cardiologist or your primary care doctor as requested.

## 2023-02-09 NOTE — ED Triage Notes (Signed)
Pt has epigastric pain that started one hour PTA and does not radiate, reports nausea and anxiety.

## 2023-02-09 NOTE — ED Provider Notes (Signed)
[facility] Provider Note   CSN: [id number] Arrival date & time: 02/09/23  1620     History  Chief Complaint  Patient presents with   Abdominal Pain    Devin Daugherty is a 72 y.o. male.  HPI    72 year old male comes in with chief complaint of abdominal pain.  Patient states that he started having epigastric abdominal pain about an hour ago while he was at [employer].  Pain is described as sharp, left-sided.  He was concerned that he was having heart attack and decided to come to the ER.  Patient has history of coronary artery disease, anxiety, depression, hypertension.  Wife accompanies the patient.  She states that over the last 6 months patient has lost about 30 pounds of weight.  Additionally patient has severe anxiety that the psychiatrist has not been able to get in the best control.  Patient takes Ativan regularly.  He was recently started on Remeron.  Patient has anxiety to the point where it is affecting his day-to-day life.  Today while they were at [facility], patient started complaining of chest pain and informed her that he could be having a heart attack.  Previously he was telling her that he thinks he has liver disease based on his review on [employer].  When patient mention chest pain and the fact that he could be having a heart attack, patient's wife thought it would be best to just bring him to the ER to rule that pathology out.  She states that patient has not mention chest pain recently.  In the past when patient had coronary artery disease, he had severe chest pain, with sweats, nausea and patient appeared pale.  He did not look anything like that today.   Currently patient states that he has no chest or abdominal pain.  Review of system is negative for shortness of breath.  Home Medications Prior to Admission medications   Medication Sig Start Date End Date Taking? Authorizing Provider  busPIRone (BUSPAR) 15 MG tablet TAKE 1  TABLET BY MOUTH TWICE DAILY 12/13/22   Lesle Chris A, NP  busPIRone (BUSPAR) 30 MG tablet Take 1 tablet (30 mg total) by mouth 2 (two) times daily. 01/04/23   Elwanda Brooklyn, NP  escitalopram (LEXAPRO) 10 MG tablet Take 1 tablet (10 mg total) by mouth daily. 02/04/23   Elwanda Brooklyn, NP  gabapentin (NEURONTIN) 300 MG capsule Take 1 capsule (300 mg total) by mouth daily. 02/09/23   Elwanda Brooklyn, NP  isosorbide mononitrate (IMDUR) 30 MG 24 hr tablet Take 30 mg by mouth daily.    [provider]  isosorbide mononitrate (IMDUR) 60 MG 24 hr tablet TAKE 1 TABLET BY MOUTH DAILY 10/28/22   Lindell Spar, MD  lisinopril (ZESTRIL) 40 MG tablet Take by mouth. 03/27/20   [provider]  LORazepam (ATIVAN) 0.5 MG tablet One by mouth up to three times daily for anxiety.- per psychiatrist 03/02/22   Elwanda Brooklyn, NP  LORazepam (ATIVAN) 1 MG tablet TAKE 1 TABLET BY MOUTH EVERY 8 HOURS 11/30/22   Lesle Chris A, NP  mirtazapine (REMERON) 15 MG tablet Take 1 tablet (15 mg total) by mouth at bedtime. 02/04/23   Elwanda Brooklyn, NP  rosuvastatin (CRESTOR) 40 MG tablet TAKE 1 TABLET BY MOUTH DAILY 10/18/22   Lindell Spar, MD  sildenafil (REVATIO) 20 MG tablet Take 2-3 tablets po 1-2 hrs before sex 08/28/20   Lovena Le,  Malena M, DO  SYRINGE-NEEDLE, DISP, 3 ML (B-D SYRINGE/NEEDLE 3CC/25GX5/8) 25G X 5/8" 3 ML MISC 1 each by Does not apply route every 14 (fourteen) days. 12/02/21   Lindell Spar, MD  Testosterone Cypionate 200 MG/ML SOLN Inject 100 mg as directed every 14 (fourteen) days. 12/01/21   Lindell Spar, MD  zolpidem (AMBIEN) 5 MG tablet TAKE 1 TABLET BY MOUTH AT BEDTIME AS NEEDED FOR SLEEP 10/26/22   Cottle, [employer] MD      Allergies    Codeine    Review of Systems   Review of Systems  All other systems reviewed and are negative.   Physical Exam Updated Vital Signs BP 100/78   Pulse (!) 58   Temp 97.8 F (36.6 C) (Oral)   Resp 18   Ht 5' 8"$  (1.727 m)   Wt 88.6 kg   SpO2 94%    BMI 29.70 kg/m  Physical Exam Vitals and nursing note reviewed.  Constitutional:      Appearance: He is well-developed.  HENT:     Head: Atraumatic.  Cardiovascular:     Rate and Rhythm: Normal rate.  Pulmonary:     Effort: Pulmonary effort is normal.  Abdominal:     Tenderness: There is no abdominal tenderness.  Musculoskeletal:     Cervical back: Neck supple.  Skin:    General: Skin is warm.  Neurological:     Mental Status: He is alert and oriented to person, place, and time.     ED Results / Procedures / Treatments   Labs (all labs ordered are listed, but only abnormal results are displayed) Labs Reviewed  COMPREHENSIVE METABOLIC PANEL - Abnormal; Notable for the following components:      Result Value   Sodium 132 (*)    Glucose, Bld 109 (*)    Creatinine, Ser 1.32 (*)    GFR, Estimated 57 (*)    All other components within normal limits  LIPASE, BLOOD - Abnormal; Notable for the following components:   Lipase 56 (*)    All other components within normal limits  CBC WITH DIFFERENTIAL/PLATELET  TROPONIN I (HIGH SENSITIVITY)  TROPONIN I (HIGH SENSITIVITY)    EKG EKG Interpretation  Date/Time:  Wednesday February 09 2023 16:32:29 EST Ventricular Rate:  81 PR Interval:  176 QRS Duration: 96 QT Interval:  356 QTC Calculation: 413 R Axis:   44 Text Interpretation: Normal sinus rhythm Normal ECG When compared with ECG of 24-Apr-2022 01:56, PREVIOUS ECG IS PRESENT No acute changes No significant change since last tracing Confirmed by Varney Biles [phone]) on 02/09/2023 6:50:06 PM  Radiology No results found.  Procedures Procedures    Medications Ordered in ED Medications  midazolam (VERSED) 5 MG/5ML injection 1 mg (1 mg Intravenous Given 02/09/23 1915)    ED Course/ Medical Decision Making/ A&P                             Medical Decision Making Amount and/or Complexity of Data Reviewed Labs: ordered.  Risk Prescription drug  management.  72 year old male comes in with chief complaint of epigastric chest pain.  He has history of anxiety, CAD.  History provided by the patient.  Substantive part of the history was also provided by patient's wife.  I have also reviewed care everywhere and looked at records from [facility] where patient follows up with cardiology.  Patient has history of depression, anxiety and Parkinson's disease.  He is seeing a neurologist and a psychiatrist.  It appears that over the last 6 months, his anxiety has worsened.  He is not really active with day-to-day life.  He also has had some weight loss.  Today he mention chest pain and abdominal pain to the wife, previously stating that he thinks he has liver disease and then while they were at a store he mentioned he was concerned he was having heart attack, prompting her to bring him to the emergency room.  Patient's EKG is reassuring.  Currently he is chest pain-free.  The pain itself is atypical, described as epigastric pain that was radiating up slightly, but pretty focal overall.  The pain did not have any associated nausea, sweats, diaphoresis and it was not provoked by anything specific.  He has not had similar pain with exertion.  The pain was not similar to his ACS pain.  Although pain is atypical, he has history of CAD and we will get delta troponins.  If the delta troponins are negative, then MI is ruled out.  We will advised that patient follows up with PCP.  Basic labs have been ordered to make sure that the pancreas, LFTs are reassuring.  Currently there is no abdominal tenderness.  At no point did patient have right upper quadrant tenderness, we do not think an ultrasound is indicated.  8:20 PM The patient appears reasonably screened and/or stabilized for discharge and I doubt any other medical condition or other [facility] requiring further screening, evaluation, or treatment in the ED at this time prior to discharge.   Results from the  ER workup discussed with the patient face to face and all questions answered to the best of my ability. The patient is safe for discharge with strict return precautions.   Final Clinical Impression(s) / ED Diagnoses Final diagnoses:  Precordial pain  Anxiety    Rx / DC Orders ED Discharge Orders     None         Varney Biles, MD 02/09/23 2020

## 2023-02-13 DIAGNOSIS — Z885 Allergy status to narcotic agent status: Secondary | ICD-10-CM | POA: Diagnosis not present

## 2023-02-13 DIAGNOSIS — T464X2A Poisoning by angiotensin-converting-enzyme inhibitors, intentional self-harm, initial encounter: Secondary | ICD-10-CM | POA: Diagnosis not present

## 2023-02-13 DIAGNOSIS — R45851 Suicidal ideations: Secondary | ICD-10-CM | POA: Diagnosis not present

## 2023-02-13 DIAGNOSIS — F32A Depression, unspecified: Secondary | ICD-10-CM | POA: Diagnosis not present

## 2023-02-13 DIAGNOSIS — F329 Major depressive disorder, single episode, unspecified: Secondary | ICD-10-CM | POA: Diagnosis not present

## 2023-02-13 DIAGNOSIS — R1013 Epigastric pain: Secondary | ICD-10-CM | POA: Diagnosis not present

## 2023-02-13 DIAGNOSIS — G20A1 Parkinson's disease without dyskinesia, without mention of fluctuations: Secondary | ICD-10-CM | POA: Diagnosis not present

## 2023-02-13 DIAGNOSIS — F1020 Alcohol dependence, uncomplicated: Secondary | ICD-10-CM | POA: Diagnosis not present

## 2023-02-13 DIAGNOSIS — S0181XA Laceration without foreign body of other part of head, initial encounter: Secondary | ICD-10-CM | POA: Diagnosis not present

## 2023-02-13 DIAGNOSIS — I959 Hypotension, unspecified: Secondary | ICD-10-CM | POA: Diagnosis not present

## 2023-02-13 DIAGNOSIS — Z23 Encounter for immunization: Secondary | ICD-10-CM | POA: Diagnosis not present

## 2023-02-13 DIAGNOSIS — R001 Bradycardia, unspecified: Secondary | ICD-10-CM | POA: Diagnosis not present

## 2023-02-13 DIAGNOSIS — S0990XA Unspecified injury of head, initial encounter: Secondary | ICD-10-CM | POA: Diagnosis not present

## 2023-02-13 DIAGNOSIS — T1491XA Suicide attempt, initial encounter: Secondary | ICD-10-CM | POA: Diagnosis not present

## 2023-02-13 DIAGNOSIS — I10 Essential (primary) hypertension: Secondary | ICD-10-CM | POA: Diagnosis not present

## 2023-02-13 DIAGNOSIS — R58 Hemorrhage, not elsewhere classified: Secondary | ICD-10-CM | POA: Diagnosis not present

## 2023-02-13 DIAGNOSIS — Z7982 Long term (current) use of aspirin: Secondary | ICD-10-CM | POA: Diagnosis not present

## 2023-02-13 DIAGNOSIS — T50902A Poisoning by unspecified drugs, medicaments and biological substances, intentional self-harm, initial encounter: Secondary | ICD-10-CM | POA: Diagnosis not present

## 2023-02-13 DIAGNOSIS — F419 Anxiety disorder, unspecified: Secondary | ICD-10-CM | POA: Diagnosis not present

## 2023-02-13 DIAGNOSIS — R109 Unspecified abdominal pain: Secondary | ICD-10-CM | POA: Diagnosis not present

## 2023-02-13 DIAGNOSIS — F1320 Sedative, hypnotic or anxiolytic dependence, uncomplicated: Secondary | ICD-10-CM | POA: Diagnosis not present

## 2023-02-13 DIAGNOSIS — F322 Major depressive disorder, single episode, severe without psychotic features: Secondary | ICD-10-CM | POA: Diagnosis not present

## 2023-02-13 DIAGNOSIS — R079 Chest pain, unspecified: Secondary | ICD-10-CM | POA: Diagnosis not present

## 2023-02-13 DIAGNOSIS — F1010 Alcohol abuse, uncomplicated: Secondary | ICD-10-CM | POA: Diagnosis not present

## 2023-02-13 DIAGNOSIS — Z79899 Other long term (current) drug therapy: Secondary | ICD-10-CM | POA: Diagnosis not present

## 2023-02-14 ENCOUNTER — Other Ambulatory Visit: Payer: Self-pay | Admitting: Behavioral Health

## 2023-02-14 DIAGNOSIS — F5105 Insomnia due to other mental disorder: Secondary | ICD-10-CM

## 2023-02-14 DIAGNOSIS — R109 Unspecified abdominal pain: Secondary | ICD-10-CM | POA: Diagnosis not present

## 2023-02-15 DIAGNOSIS — Z23 Encounter for immunization: Secondary | ICD-10-CM | POA: Diagnosis not present

## 2023-02-15 DIAGNOSIS — Z7982 Long term (current) use of aspirin: Secondary | ICD-10-CM | POA: Diagnosis not present

## 2023-02-15 DIAGNOSIS — T50902A Poisoning by unspecified drugs, medicaments and biological substances, intentional self-harm, initial encounter: Secondary | ICD-10-CM | POA: Diagnosis not present

## 2023-02-15 DIAGNOSIS — Z885 Allergy status to narcotic agent status: Secondary | ICD-10-CM | POA: Diagnosis not present

## 2023-02-15 DIAGNOSIS — G20A1 Parkinson's disease without dyskinesia, without mention of fluctuations: Secondary | ICD-10-CM | POA: Diagnosis not present

## 2023-02-15 DIAGNOSIS — F329 Major depressive disorder, single episode, unspecified: Secondary | ICD-10-CM | POA: Diagnosis not present

## 2023-02-15 DIAGNOSIS — T1491XA Suicide attempt, initial encounter: Secondary | ICD-10-CM | POA: Diagnosis not present

## 2023-02-15 DIAGNOSIS — F1010 Alcohol abuse, uncomplicated: Secondary | ICD-10-CM | POA: Diagnosis not present

## 2023-02-15 DIAGNOSIS — F419 Anxiety disorder, unspecified: Secondary | ICD-10-CM | POA: Diagnosis not present

## 2023-02-15 DIAGNOSIS — I959 Hypotension, unspecified: Secondary | ICD-10-CM | POA: Diagnosis not present

## 2023-02-15 DIAGNOSIS — Z9151 Personal history of suicidal behavior: Secondary | ICD-10-CM | POA: Diagnosis not present

## 2023-02-15 DIAGNOSIS — K219 Gastro-esophageal reflux disease without esophagitis: Secondary | ICD-10-CM | POA: Diagnosis not present

## 2023-02-15 DIAGNOSIS — F332 Major depressive disorder, recurrent severe without psychotic features: Secondary | ICD-10-CM | POA: Diagnosis not present

## 2023-02-15 DIAGNOSIS — F411 Generalized anxiety disorder: Secondary | ICD-10-CM | POA: Diagnosis not present

## 2023-02-15 DIAGNOSIS — I10 Essential (primary) hypertension: Secondary | ICD-10-CM | POA: Diagnosis not present

## 2023-02-15 DIAGNOSIS — Z79899 Other long term (current) drug therapy: Secondary | ICD-10-CM | POA: Diagnosis not present

## 2023-02-15 DIAGNOSIS — F32A Depression, unspecified: Secondary | ICD-10-CM | POA: Diagnosis not present

## 2023-02-15 DIAGNOSIS — R45851 Suicidal ideations: Secondary | ICD-10-CM | POA: Diagnosis not present

## 2023-02-15 DIAGNOSIS — R1013 Epigastric pain: Secondary | ICD-10-CM | POA: Diagnosis not present

## 2023-02-21 ENCOUNTER — Ambulatory Visit: Payer: Medicare Other | Admitting: Internal Medicine

## 2023-02-23 ENCOUNTER — Telehealth: Payer: Self-pay | Admitting: Internal Medicine

## 2023-02-23 NOTE — Telephone Encounter (Signed)
TOC call needed   Toc appt made 03/01/23  Pt dc 2/29 from [facility]

## 2023-02-25 ENCOUNTER — Ambulatory Visit: Payer: Medicare Other | Admitting: Behavioral Health

## 2023-02-28 ENCOUNTER — Other Ambulatory Visit: Payer: Self-pay | Admitting: Behavioral Health

## 2023-03-01 ENCOUNTER — Encounter: Payer: Self-pay | Admitting: Internal Medicine

## 2023-03-01 ENCOUNTER — Ambulatory Visit (INDEPENDENT_AMBULATORY_CARE_PROVIDER_SITE_OTHER): Payer: Medicare Other | Admitting: Internal Medicine

## 2023-03-01 VITALS — BP 123/81 | HR 64 | Ht 68.0 in | Wt 191.0 lb

## 2023-03-01 DIAGNOSIS — F419 Anxiety disorder, unspecified: Secondary | ICD-10-CM | POA: Diagnosis not present

## 2023-03-01 DIAGNOSIS — R630 Anorexia: Secondary | ICD-10-CM

## 2023-03-01 DIAGNOSIS — F5105 Insomnia due to other mental disorder: Secondary | ICD-10-CM

## 2023-03-01 DIAGNOSIS — F99 Mental disorder, not otherwise specified: Secondary | ICD-10-CM

## 2023-03-01 MED ORDER — MIRTAZAPINE 30 MG PO TABS: 30.0000 mg | ORAL_TABLET | Freq: Every day | ORAL | 0 refills | Status: DC

## 2023-03-01 NOTE — Patient Instructions (Signed)
Thank you for trusting me with your care. To recap, today we discussed the following:   Anxiety  - Referral for counselor - Regular physical activity is one of the most important things you can do for your health. If you're ready to get the immediate benefits of better sleep and reduced anxiety here are ways to get started. Look for ways to reduce time sitting and increase time moving. For example, make it a tradition to walk before or after dinner. I recommend at least 150 minutes of moderate-intensity physical activity a week for adults. You might split that into 30 minutes, 5 days a week.  Also try to stay active during the day. Find activities you enjoy to keep your mind occupied  Follow up with your psychiatrist on Thursday as planned.

## 2023-03-01 NOTE — Progress Notes (Unsigned)
   HPI:Devin Daugherty is a 72 y.o. male who presents for evaluation of No chief complaint on file. . For the details of today's visit, please refer to the assessment and plan.  Physical Exam: There were no vitals filed for this visit.   Physical Exam   Assessment & Plan:   There are no diagnoses linked to this encounter.    Lorene Dy, MD

## 2023-03-02 NOTE — Assessment & Plan Note (Addendum)
Patient recently admitted to psychiatric hospital after presenting to ED after intentional succide attempt by taking carvedilol and lisinopril. He is living with parkinson's disease confirmed with DAT scan and uncontrolled anxiety. Review of records show he is closely followed by outpatient psychiatry. Parkinson's has complicated treatment as  he can not take Zyprexa. He tells me no medication have changed his anxiety. He was discharged with increase dose of Mirtazapine to 30 mg and remains on Buspar 30 mg, Lexapro 10 mg, and Ativan 1 mg q8hrs. He has follow up with psychiatry on Thursday. Denies HI or SI. Wife is with patient and no weapons available at home. She is managing medication and endorsed they are locked up.   Plan: Will defer medication changes to psychiatry.  Counseled extensively on lifestyle interventions in addition to medications prescribed to help with anxiety. Will benefit from staying active. Referral for counselor Recommended walking twice a day and finding activities he can do during the day to keep his mind occupied.

## 2023-03-03 ENCOUNTER — Encounter: Payer: Self-pay | Admitting: Behavioral Health

## 2023-03-03 ENCOUNTER — Ambulatory Visit (INDEPENDENT_AMBULATORY_CARE_PROVIDER_SITE_OTHER): Payer: Medicare Other | Admitting: Behavioral Health

## 2023-03-03 DIAGNOSIS — F331 Major depressive disorder, recurrent, moderate: Secondary | ICD-10-CM

## 2023-03-03 DIAGNOSIS — R630 Anorexia: Secondary | ICD-10-CM

## 2023-03-03 DIAGNOSIS — F411 Generalized anxiety disorder: Secondary | ICD-10-CM | POA: Diagnosis not present

## 2023-03-03 DIAGNOSIS — F5105 Insomnia due to other mental disorder: Secondary | ICD-10-CM

## 2023-03-03 DIAGNOSIS — F410 Panic disorder [episodic paroxysmal anxiety] without agoraphobia: Secondary | ICD-10-CM

## 2023-03-03 DIAGNOSIS — F99 Mental disorder, not otherwise specified: Secondary | ICD-10-CM

## 2023-03-03 MED ORDER — MIRTAZAPINE 30 MG PO TABS: 30.0000 mg | ORAL_TABLET | Freq: Every day | ORAL | 1 refills | Status: DC

## 2023-03-03 MED ORDER — BUSPIRONE HCL 30 MG PO TABS: 30.0000 mg | ORAL_TABLET | Freq: Two times a day (BID) | ORAL | 3 refills | Status: DC

## 2023-03-03 MED ORDER — ESCITALOPRAM OXALATE 20 MG PO TABS: 20.0000 mg | ORAL_TABLET | Freq: Every day | ORAL | 3 refills | Status: DC

## 2023-03-03 MED ORDER — LORAZEPAM 1 MG PO TABS: 1.0000 mg | ORAL_TABLET | Freq: Two times a day (BID) | ORAL | 2 refills | Status: DC

## 2023-03-03 MED ORDER — ZOLPIDEM TARTRATE 10 MG PO TABS: 10.0000 mg | ORAL_TABLET | Freq: Every evening | ORAL | 0 refills | Status: AC | PRN

## 2023-03-03 NOTE — Progress Notes (Signed)
[facility]  Patient ID: KANNAN PAMINTUAN,  MRN: [id number]  PCP: Lindell Spar, MD  Date of Evaluation: 03/03/2023 Time spent:45 minutes  Chief Complaint:  Chief Complaint   Anxiety; Depression; Patient Education; Medication Refill; Follow-up; Medication Problem; Family Problem; Weight Loss     HISTORY/CURRENT STATUS: HPI  [name], 72 year old male presents to this office for follow up and medication management.  His wife is present during interview with his consent.  He is still very withdrawn and flat. He eyes fixated, expressionless, and appears distressed. Noticeable weight loss and does not look well.  Reports having gastric pain in primarily in the morning. Wife say appetite is poor. He says he went to the hospital after taking too many BP pills. Says that he was depressed and discouraged and just did it.  Wife says she is seeing more changes with memory and irrational behaviors. Says he is staying in recliner the whole day and not wanting to do anything. Says he feels very guilty for taking the pills. Wife says she does not know what else to do. She is locking the medication away and handling administration. He again is fixated on getting his Ativan. Wife says he demands it sometimes but she tells him no. Says he does not know what else to do about his anxiety.  Says she understands that Parkinson complicates tx and is progressive.  Had recent follow up with PCP.  He reports anxiety at 8/10 and depression at 3/10. Says he sleeps 5-7 hours per night.  No mania, no psychosis. No current SI/HI.  Pt says he feels safe and verbally contracts for safety. Says he feels guilty about what he did and loves his family. To continue to follow up with neurology.    Past psychiatric medication trials: Prozac Abilify Paxil  Ingrezza Risperdal Zoloft Ambien Mirtazapine-Not really working anymore Gabapentin Trazodone- Ineffective Viibryd-anger, irritability Olanzapine-partial  response   Individual Medical History/ Review of Systems: Changes? :No   Allergies: Codeine  Current Medications:  Current Outpatient Medications:    escitalopram (LEXAPRO) 20 MG tablet, Take 1 tablet (20 mg total) by mouth daily., Disp: 30 tablet, Rfl: 3   zolpidem (AMBIEN) 10 MG tablet, Take 1 tablet (10 mg total) by mouth at bedtime as needed for sleep., Disp: 30 tablet, Rfl: 0   busPIRone (BUSPAR) 30 MG tablet, Take 1 tablet (30 mg total) by mouth 2 (two) times daily., Disp: 60 tablet, Rfl: 3   escitalopram (LEXAPRO) 10 MG tablet, Take 1 tablet (10 mg total) by mouth daily., Disp: 30 tablet, Rfl: 1   isosorbide mononitrate (IMDUR) 30 MG 24 hr tablet, Take 30 mg by mouth daily., Disp: , Rfl:    isosorbide mononitrate (IMDUR) 60 MG 24 hr tablet, TAKE 1 TABLET BY MOUTH DAILY, Disp: 90 tablet, Rfl: 3   lisinopril (ZESTRIL) 40 MG tablet, Take by mouth., Disp: , Rfl:    LORazepam (ATIVAN) 1 MG tablet, Take 1 tablet (1 mg total) by mouth 2 (two) times daily., Disp: 60 tablet, Rfl: 2   mirtazapine (REMERON) 30 MG tablet, Take 1 tablet (30 mg total) by mouth at bedtime., Disp: 90 tablet, Rfl: 1   rosuvastatin (CRESTOR) 40 MG tablet, TAKE 1 TABLET BY MOUTH DAILY, Disp: 90 tablet, Rfl: 3   sildenafil (REVATIO) 20 MG tablet, Take 2-3 tablets po 1-2 hrs before sex, Disp: 24 tablet, Rfl: 5 Medication Side Effects: none  Family Medical/ Social History: Changes? No  MENTAL HEALTH EXAM:  There were  no vitals taken for this visit.There is no height or weight on file to calculate BMI.  General Appearance: Casual, Neat, and Well Groomed  Eye Contact:  Good  Speech:  Clear and Coherent  Volume:  Normal  Mood:  Anxious, Depressed, and Dysphoric  Affect:  Congruent, Depressed, Flat, and Anxious  Thought Process:  Coherent  Orientation:  Full (Time, Place, and Person)  Thought Content: Logical   Suicidal Thoughts:  No  Homicidal Thoughts:  No  Memory:  WNL  Judgement:  Poor  Insight:  Lacking   Psychomotor Activity:  Decreased  Concentration:  Concentration: Fair  Recall:  [place] of Knowledge: Poor  Language: Good  Assets:  Desire for Improvement Physical Health Resilience Social Support  ADL's:  Impaired  Cognition: Impaired,  Mild  Prognosis:  Poor    DIAGNOSES:    ICD-10-CM   1. Major depressive disorder, recurrent episode, moderate (HCC)  F33.1 escitalopram (LEXAPRO) 20 MG tablet    2. Anxiety state  F41.1 escitalopram (LEXAPRO) 20 MG tablet    LORazepam (ATIVAN) 1 MG tablet    3. Generalized anxiety disorder  F41.1 busPIRone (BUSPAR) 30 MG tablet    LORazepam (ATIVAN) 1 MG tablet    4. Poor appetite  R63.0 mirtazapine (REMERON) 30 MG tablet    5. Insomnia due to other mental disorder  F51.05 mirtazapine (REMERON) 30 MG tablet   F99 zolpidem (AMBIEN) 10 MG tablet    6. Panic disorder  F41.0 LORazepam (ATIVAN) 1 MG tablet      Receiving Psychotherapy: No   Greater than 50% of 45  min. face to face time with patient was spent on counseling and coordination of care. We discussed his recent intentional suicide attempt by taking excess BP meds on 02/13/2023.  On presentation to the [facility] and BH, Neldon Labella, MD felt that Mikes chronic benzo use was contributing to anxiety state and would not improve until a intentional slow taper of benzo's. He was later admitted to [facility] for assessment and was treated by Dr. Reece Levy. He was discharged on 02/24/2023.  I previously had made some moderate progress with Ronalee Belts with administration of Olanzapine 15 mg a couple of months ago. However due to his dx of Parkinson's the contraindication forced me to withdraw the medication. Ronalee Belts has never been stabilized in the 1.5 years he has been my patient. Some mild improvement  here and there, but one of the worst cases of chronic severe anxiety that I have seen. 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 We have tried to wean and use other medication  alternatives throughout his tx here without much success. His wife also reports that his diet is poor and not eating much throughout the day. He look sick this visit.  I also talked to Ronalee Belts and his wife that they were not honest with me about Mikes ETOH use. After reviewing ER report, I discovered for the first time that Ronalee Belts has had periods of hiding liquor in the house and sneaking drinks. I asked Ronalee Belts about this and he said he did not have an answer for why he hid this from me. I educated him on how important this information is and how it could effect his care. This breaks the trust for a therapeutic patient/ provider relationship. She says he has lost 15 lbs over the last month. We discuss patients report of stomach pain or discomfort in the morning when he wakes up. I  highly encouraged him to follow up with is PCP asap.  Due to the complexity and treatment of Ronalee Belts, I have expressed my concerns to patient and feel that I have reached my medication max. I feel that I have also reached my limit and confidence to effectively treat this patient. I feel that it is in his best interest to seek higher level of care and preferably geriatric psychiatry.  I am therefore recommended referral to Dr. Norma Fredrickson at [facility] for further review and assessment.  I do agree that some focus should address his chronic benzo use and possible ETOH.  I have agreed to bridge medication and address and urgent concern until other care can be established.  He understands emergency resources such as [facility], [employer]. He was provided info for suicide hotline and instructed to dial 911 in an emergency.  Wife is assisting in supervision.     Will reduce Ativan 1 mg to two times daily. Patient has not responded well to other medications.   Patient will continue to follow up with neurology.   We decided to;  Continue Buspar 30 mg twice daily. Pt reports he was only taking once daily.  Pt was discontinue from Ativan by  Dr. Reece Levy Continue Ativan 1 mg twice daily as needed Increased Lexapro to 20 mg daily after breakfast  Mirtazapine was increased to 30 mg  to assist with sleep, poor appetite. Take at bedtime. Continue Ambien 10 mg daily at bedtime Will report and side effects or worsening To follow up with PCP promptly for gastric pain. To follow up in 8 weeks to reassess  Pt is to continue f/u with neurology as needed.  Reviewed PDMP    Elwanda Brooklyn, NP

## 2023-03-09 ENCOUNTER — Telehealth: Payer: Self-pay | Admitting: Behavioral Health

## 2023-03-09 ENCOUNTER — Encounter: Payer: Medicare Other | Admitting: Licensed Clinical Social Worker

## 2023-03-09 NOTE — Telephone Encounter (Signed)
Please see message from patient. I called him and he is having "debilitating anxiety" all the time, from the time he gets up until he goes to bed. He is able to sleep d/t sleeping medication. He said you cut back on the lorazepam and it isn't helping. He is asking for something to help him "get over this."

## 2023-03-09 NOTE — Telephone Encounter (Signed)
LVM to RC 

## 2023-03-09 NOTE — Telephone Encounter (Signed)
Pt called and said that he is having terrible anxiety and needs something ASAP  to calm him down. Please call him at [phone]

## 2023-03-09 NOTE — Telephone Encounter (Signed)
Notified patients of recommendations. He said he thought he had an appt with Dr. Casimiro Needle on Monday.

## 2023-03-11 DIAGNOSIS — F332 Major depressive disorder, recurrent severe without psychotic features: Secondary | ICD-10-CM | POA: Diagnosis not present

## 2023-03-11 DIAGNOSIS — F410 Panic disorder [episodic paroxysmal anxiety] without agoraphobia: Secondary | ICD-10-CM | POA: Diagnosis not present

## 2023-03-11 DIAGNOSIS — F411 Generalized anxiety disorder: Secondary | ICD-10-CM | POA: Diagnosis not present

## 2023-03-14 DIAGNOSIS — F332 Major depressive disorder, recurrent severe without psychotic features: Secondary | ICD-10-CM | POA: Diagnosis not present

## 2023-03-17 DIAGNOSIS — F410 Panic disorder [episodic paroxysmal anxiety] without agoraphobia: Secondary | ICD-10-CM | POA: Diagnosis not present

## 2023-03-17 DIAGNOSIS — F451 Undifferentiated somatoform disorder: Secondary | ICD-10-CM | POA: Diagnosis not present

## 2023-03-17 DIAGNOSIS — F332 Major depressive disorder, recurrent severe without psychotic features: Secondary | ICD-10-CM | POA: Diagnosis not present

## 2023-03-17 DIAGNOSIS — F411 Generalized anxiety disorder: Secondary | ICD-10-CM | POA: Diagnosis not present

## 2023-03-18 ENCOUNTER — Ambulatory Visit: Payer: Medicare Other | Admitting: Behavioral Health

## 2023-03-24 ENCOUNTER — Ambulatory Visit (INDEPENDENT_AMBULATORY_CARE_PROVIDER_SITE_OTHER): Payer: Medicare Other | Admitting: Internal Medicine

## 2023-03-24 ENCOUNTER — Encounter: Payer: Self-pay | Admitting: Internal Medicine

## 2023-03-24 VITALS — BP 107/76 | HR 62 | Ht 68.0 in | Wt 195.2 lb

## 2023-03-24 DIAGNOSIS — E782 Mixed hyperlipidemia: Secondary | ICD-10-CM

## 2023-03-24 DIAGNOSIS — F329 Major depressive disorder, single episode, unspecified: Secondary | ICD-10-CM | POA: Diagnosis not present

## 2023-03-24 DIAGNOSIS — Z9861 Coronary angioplasty status: Secondary | ICD-10-CM

## 2023-03-24 DIAGNOSIS — T43505A Adverse effect of unspecified antipsychotics and neuroleptics, initial encounter: Secondary | ICD-10-CM

## 2023-03-24 DIAGNOSIS — E559 Vitamin D deficiency, unspecified: Secondary | ICD-10-CM | POA: Diagnosis not present

## 2023-03-24 DIAGNOSIS — E871 Hypo-osmolality and hyponatremia: Secondary | ICD-10-CM | POA: Diagnosis not present

## 2023-03-24 DIAGNOSIS — F333 Major depressive disorder, recurrent, severe with psychotic symptoms: Secondary | ICD-10-CM

## 2023-03-24 DIAGNOSIS — N1831 Chronic kidney disease, stage 3a: Secondary | ICD-10-CM

## 2023-03-24 DIAGNOSIS — Z0001 Encounter for general adult medical examination with abnormal findings: Secondary | ICD-10-CM

## 2023-03-24 DIAGNOSIS — I2510 Atherosclerotic heart disease of native coronary artery without angina pectoris: Secondary | ICD-10-CM | POA: Diagnosis not present

## 2023-03-24 DIAGNOSIS — R7303 Prediabetes: Secondary | ICD-10-CM

## 2023-03-24 DIAGNOSIS — Z125 Encounter for screening for malignant neoplasm of prostate: Secondary | ICD-10-CM | POA: Diagnosis not present

## 2023-03-24 DIAGNOSIS — G2111 Neuroleptic induced parkinsonism: Secondary | ICD-10-CM

## 2023-03-24 DIAGNOSIS — I10 Essential (primary) hypertension: Secondary | ICD-10-CM

## 2023-03-24 NOTE — Assessment & Plan Note (Addendum)
Ordered PSA after discussing its limitations for prostate cancer screening, including false positive results leading to additional investigations. 

## 2023-03-24 NOTE — Assessment & Plan Note (Addendum)
Physical exam as documented. Fasting blood tests today. Advised to get Shingrix vaccines at local pharmacy.

## 2023-03-24 NOTE — Progress Notes (Signed)
Established Patient Office Visit  Subjective:  Patient ID: Devin Daugherty, male    DOB: 21-Sep-1951  Age: 72 y.o. MRN: [id number]  CC:  Chief Complaint  Patient presents with   [facility]     Patient is requesting bloodwork done. Wife is present she wants his sodium and vitamin D checked    Annual Exam    HPI Devin Daugherty is a 72 y.o. male with past medical history of HTN, CAD s/p stent placement, HLD, depression with anxiety, CKD stage 3a, and medication induced parkinsonism who presents for f/u of his chronic medical conditions.  HTN: BP is well-controlled. Takes medications regularly. Patient denies headache, dizziness, chest pain, dyspnea or palpitations.   CAD: S/p stent placement, on Aspirin and statin, followed by Cardiology.  Denies any chest pain currently.   Depression with anxiety: Followed by Psychiatry. He is on multiple medications. Denies any SI or HI.  He had recent hospitalization for suicidal ideation/intentional drug overdose of carvedilol and lisinopril in 02/18.  He was admitted for inpatient psychiatry evaluation and has seen psychiatrist at [facility].  He is placed back on Abilify.  His dose of Ativan was recently decreased to 1 mg twice daily and he had been having severe anxiety due to it.  He is still complains of apathy, severe anxiety, insomnia and decreased concentration.   Past Medical History:  Diagnosis Date   Anxiety    CAD (coronary artery disease)    Depression    High cholesterol    Hypertension     Past Surgical History:  Procedure Laterality Date   COLONOSCOPY     CORONARY ANGIOPLASTY WITH STENT PLACEMENT  05/2018   Heart  Stent  2009   HERNIA REPAIR      Family History  Problem Relation Age of Onset   Heart attack Father    Hyperlipidemia Son     Social History   Socioeconomic History   Marital status: Married    Spouse name: Not on file   Number of children: 2   Years of education: 14   Highest education level:  Associate degree: occupational, Hotel manager, or vocational program  Occupational History   Occupation: retired    Fish farm manager: [employer]    Comment: welder  Tobacco Use   Smoking status: Never   Smokeless tobacco: Never  Vaping Use   Vaping Use: Never used  Substance and Sexual Activity   Alcohol use: No   Drug use: No   Sexual activity: Yes    Partners: Female    Birth control/protection: None    Comment: married  Other Topics Concern   Not on file  Social History Narrative   Married, lives at home with spouse. Multiple children and grandchildren. 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 Active in local church. Likes to travel with spouse to the beach.    Social Determinants of Health   Financial Resource Strain: Low Risk  (10/20/2021)   Overall Financial Resource Strain (CARDIA)    Difficulty of Paying Living Expenses: Not hard at all  Food Insecurity: No Food Insecurity (10/20/2021)   Hunger Vital Sign    Worried About Running Out of Food in the Last Year: Never true    Ran Out of Food in the Last Year: Never true  Transportation Needs: No Transportation Needs (10/20/2021)   PRAPARE - Hydrologist (Medical): No    Lack of Transportation (Non-Medical): No  Physical Activity: Sufficiently  Active (10/20/2021)   Exercise Vital Sign    Days of Exercise per Week: 7 days    Minutes of Exercise per Session: 60 min  Stress: No Stress Concern Present (10/20/2021)   [place]    Feeling of Stress : Not at all  Social Connections: Moderately Integrated (10/20/2021)   Social Connection and Isolation Panel [NHANES]    Frequency of Communication with Friends and Family: Three times a week    Frequency of Social Gatherings with Friends and Family: Three times a week    Attends Religious Services: More than 4 times per year    Active Member of Clubs or Organizations: No    Attends  Archivist Meetings: Never    Marital Status: Married  Human resources officer Violence: Not At Risk (10/20/2021)   Humiliation, Afraid, Rape, and Kick questionnaire    Fear of Current or Ex-Partner: No    Emotionally Abused: No    Physically Abused: No    Sexually Abused: No    Outpatient Medications Prior to Visit  Medication Sig Dispense Refill   ARIPiprazole (ABILIFY) 10 MG tablet Take by mouth.     carbidopa-levodopa (SINEMET IR) 25-100 MG tablet Take 1 tablet by mouth 3 (three) times daily.     hydrOXYzine (VISTARIL) 25 MG capsule Take by mouth.     busPIRone (BUSPAR) 30 MG tablet Take 1 tablet (30 mg total) by mouth 2 (two) times daily. 60 tablet 3   escitalopram (LEXAPRO) 20 MG tablet Take 1 tablet (20 mg total) by mouth daily. 30 tablet 3   isosorbide mononitrate (IMDUR) 60 MG 24 hr tablet TAKE 1 TABLET BY MOUTH DAILY 90 tablet 3   lisinopril (ZESTRIL) 40 MG tablet Take by mouth.     LORazepam (ATIVAN) 1 MG tablet Take 1 tablet (1 mg total) by mouth 2 (two) times daily. 60 tablet 2   mirtazapine (REMERON) 30 MG tablet Take 1 tablet (30 mg total) by mouth at bedtime. 90 tablet 1   rosuvastatin (CRESTOR) 40 MG tablet TAKE 1 TABLET BY MOUTH DAILY 90 tablet 3   sildenafil (REVATIO) 20 MG tablet Take 2-3 tablets po 1-2 hrs before sex 24 tablet 5   zolpidem (AMBIEN) 10 MG tablet Take 1 tablet (10 mg total) by mouth at bedtime as needed for sleep. 30 tablet 0   escitalopram (LEXAPRO) 10 MG tablet Take 1 tablet (10 mg total) by mouth daily. 30 tablet 1   isosorbide mononitrate (IMDUR) 30 MG 24 hr tablet Take 30 mg by mouth daily.     No facility-administered medications prior to visit.    Allergies  Allergen Reactions   Codeine Itching    ROS Review of Systems  Constitutional:  Positive for fatigue. Negative for chills, fever and unexpected weight change.  HENT:  Negative for sinus pressure and sinus pain.   Eyes:  Negative for pain and discharge.  Respiratory:  Negative  for cough and shortness of breath.   Cardiovascular:  Negative for chest pain and palpitations.  Gastrointestinal:  Negative for diarrhea, nausea and vomiting.  Genitourinary:  Negative for dysuria and hematuria.  Musculoskeletal:  Negative for neck pain and neck stiffness.  Skin:  Negative for rash.  Neurological:  Negative for dizziness and seizures.  Psychiatric/Behavioral:  Positive for decreased concentration, dysphoric mood and sleep disturbance. Negative for agitation. The patient is nervous/anxious.       Objective:    Physical Exam Vitals reviewed.  Constitutional:  General: He is not in acute distress.    Appearance: He is not diaphoretic.  HENT:     Head: Normocephalic and atraumatic.     Nose: Nose normal.     Mouth/Throat:     Mouth: Mucous membranes are moist.  Eyes:     General: No scleral icterus.    Extraocular Movements: Extraocular movements intact.  Cardiovascular:     Rate and Rhythm: Normal rate and regular rhythm.     Pulses: Normal pulses.     Heart sounds: Normal heart sounds. No murmur heard. Pulmonary:     Breath sounds: Normal breath sounds. No wheezing or rales.  Musculoskeletal:     Cervical back: Neck supple. No tenderness.     Right lower leg: No edema.     Left lower leg: No edema.  Feet:     Left foot:     Skin integrity: Callus (Between second and third toe) present.  Skin:    General: Skin is warm.     Findings: No rash.  Neurological:     General: No focal deficit present.     Mental Status: He is alert and oriented to person, place, and time.  Psychiatric:        Mood and Affect: Mood is depressed.        Speech: Speech is delayed.        Behavior: Behavior is slowed.        Thought Content: Thought content does not include homicidal or suicidal ideation.     BP 107/76 (BP Location: Right Arm, Patient Position: Sitting, Cuff Size: Normal)   Pulse 62   Ht 5\' 8"  (1.727 m)   Wt 195 lb 3.2 oz (88.5 kg)   SpO2 94%   BMI  29.68 kg/m  Wt Readings from Last 3 Encounters:  03/24/23 195 lb 3.2 oz (88.5 kg)  03/01/23 191 lb 0.6 oz (86.7 kg)  02/09/23 195 lb 4.8 oz (88.6 kg)    Lab Results  Component Value Date   TSH 2.540 10/23/2021   Lab Results  Component Value Date   WBC 10.1 02/09/2023   HGB 15.5 02/09/2023   HCT 45.1 02/09/2023   MCV 84.9 02/09/2023   PLT 352 02/09/2023   Lab Results  Component Value Date   NA 132 (L) 02/09/2023   K 4.3 02/09/2023   CO2 22 02/09/2023   GLUCOSE 109 (H) 02/09/2023   BUN 13 02/09/2023   CREATININE 1.32 (H) 02/09/2023   BILITOT 0.6 02/09/2023   ALKPHOS 79 02/09/2023   AST 26 02/09/2023   ALT 27 02/09/2023   PROT 7.8 02/09/2023   ALBUMIN 4.2 02/09/2023   CALCIUM 9.4 02/09/2023   ANIONGAP 11 02/09/2023   EGFR 54 (L) 06/30/2022   Lab Results  Component Value Date   CHOL 152 06/30/2022   Lab Results  Component Value Date   HDL 35 (L) 06/30/2022   Lab Results  Component Value Date   LDLCALC 60 06/30/2022   Lab Results  Component Value Date   TRIG 369 (H) 06/30/2022   Lab Results  Component Value Date   CHOLHDL 4.3 06/30/2022   Lab Results  Component Value Date   HGBA1C 5.8 12/13/2014      Assessment & Plan:   Problem List Items Addressed This Visit       Cardiovascular and Mediastinum   Hypertension (Chronic)    BP Readings from Last 1 Encounters:  03/24/23 107/76  Well-controlled with Lisinopril, Metoprolol and Imdur Counseled  for compliance with the medications Advised DASH diet and moderate exercise/walking, at least 150 mins/week      Relevant Orders   CMP14+EGFR   CBC with Differential/Platelet   CAD S/P percutaneous coronary angioplasty    Followed by Cardiology On Aspirin and statin On Metoprolol and Imdur      Relevant Orders   CMP14+EGFR   CBC with Differential/Platelet     Nervous and Auditory   Neuroleptic-induced parkinsonism ([place])    Tremors likely due to antipsychotic Has been given Ingrezza in the past  for TD, but is not taking it currently He is recently placed back on Abilify, followed by psychiatry and neurology        Genitourinary   Stage 3 chronic kidney disease ([place])    Stable stage 3a CKD, reviewed last BMP Avoid nephrotoxic agents Needs to improve fluid intake, at least 64 ounces per day On Lisinopril      Relevant Orders   CMP14+EGFR   CBC with Differential/Platelet   UA/M w/rflx Culture, Routine     Other   MDD (major depressive disorder)    Uncontrolled On Lexapro, Mirtazepine and Abilify- followed by Psychiatry at [facility]      Relevant Medications   hydrOXYzine (VISTARIL) 25 MG capsule   Other Relevant Orders   TSH   Prediabetes   Relevant Orders   Hemoglobin A1c   Vitamin D deficiency    Last vitamin D Lab Results  Component Value Date   VD25OH 20.8 (L) 06/30/2022  Continue vitamin D 2000 IU daily      Relevant Orders   VITAMIN D 25 Hydroxy (Vit-D Deficiency, Fractures)   Mixed hyperlipidemia    On Crestor Check lipid profile      Relevant Orders   Lipid panel   Hyponatremia    Has chronic hyponatremia, likely due to SIADH from multiple psychiatric medications Also takes lisinopril for HTN Check CMP Advised to maintain adequate hydration      Encounter for general adult medical examination with abnormal findings - Primary    Physical exam as documented. Fasting blood tests today. Advised to get Shingrix vaccines at local pharmacy.      Prostate cancer screening    Ordered PSA after discussing its limitations for prostate cancer screening, including false positive results leading to additional investigations.      Relevant Orders   PSA    No orders of the defined types were placed in this encounter.   Follow-up: Return in about 4 months (around 07/24/2023) for HTN and CKD.    Lindell Spar, MD

## 2023-03-24 NOTE — Patient Instructions (Signed)
Please continue to take medications as prescribed.  Please continue to follow low salt diet and perform moderate exercise/walking at least 150 mins/week. 

## 2023-03-24 NOTE — Assessment & Plan Note (Signed)
BP Readings from Last 1 Encounters:  03/24/23 107/76   Well-controlled with Lisinopril, Metoprolol and Imdur Counseled for compliance with the medications Advised DASH diet and moderate exercise/walking, at least 150 mins/week

## 2023-03-24 NOTE — Assessment & Plan Note (Signed)
On Crestor Check lipid profile 

## 2023-03-24 NOTE — Assessment & Plan Note (Signed)
Followed by Cardiology On Aspirin and statin On Metoprolol and Imdur

## 2023-03-24 NOTE — Assessment & Plan Note (Signed)
Uncontrolled On Lexapro, Mirtazepine and Abilify- followed by Psychiatry at [facility]

## 2023-03-24 NOTE — Assessment & Plan Note (Signed)
Stable stage 3a CKD, reviewed last BMP Avoid nephrotoxic agents Needs to improve fluid intake, at least 64 ounces per day On Lisinopril

## 2023-03-24 NOTE — Assessment & Plan Note (Signed)
Tremors likely due to antipsychotic Has been given Ingrezza in the past for TD, but is not taking it currently He is recently placed back on Abilify, followed by psychiatry and neurology

## 2023-03-24 NOTE — Assessment & Plan Note (Signed)
Last vitamin D Lab Results  Component Value Date   VD25OH 20.8 (L) 06/30/2022   Continue vitamin D 2000 IU daily

## 2023-03-24 NOTE — Assessment & Plan Note (Signed)
Has chronic hyponatremia, likely due to SIADH from multiple psychiatric medications Also takes lisinopril for HTN Check CMP Advised to maintain adequate hydration

## 2023-03-25 ENCOUNTER — Telehealth: Payer: Self-pay | Admitting: Internal Medicine

## 2023-03-25 LAB — CMP14+EGFR
ALT: 20 IU/L (ref 0–44)
AST: 20 IU/L (ref 0–40)
Albumin/Globulin Ratio: 1.5 (ref 1.2–2.2)
Albumin: 4.1 g/dL (ref 3.8–4.8)
Alkaline Phosphatase: 104 IU/L (ref 44–121)
BUN/Creatinine Ratio: 7 — ABNORMAL LOW (ref 10–24)
BUN: 9 mg/dL (ref 8–27)
Bilirubin Total: 0.5 mg/dL (ref 0.0–1.2)
CO2: 21 mmol/L (ref 20–29)
Calcium: 9.4 mg/dL (ref 8.6–10.2)
Chloride: 93 mmol/L — ABNORMAL LOW (ref 96–106)
Creatinine, Ser: 1.21 mg/dL (ref 0.76–1.27)
Globulin, Total: 2.8 g/dL (ref 1.5–4.5)
Glucose: 102 mg/dL — ABNORMAL HIGH (ref 70–99)
Potassium: 5 mmol/L (ref 3.5–5.2)
Sodium: 128 mmol/L — ABNORMAL LOW (ref 134–144)
Total Protein: 6.9 g/dL (ref 6.0–8.5)
eGFR: 63 mL/min/{1.73_m2} (ref >59)

## 2023-03-25 LAB — CBC WITH DIFFERENTIAL/PLATELET
Basophils Absolute: 0.1 10*3/uL (ref 0.0–0.2)
Basos: 1 %
EOS (ABSOLUTE): 0.1 10*3/uL (ref 0.0–0.4)
Eos: 2 %
Hematocrit: 41.8 % (ref 37.5–51.0)
Hemoglobin: 14.4 g/dL (ref 13.0–17.7)
Immature Grans (Abs): 0 10*3/uL (ref 0.0–0.1)
Immature Granulocytes: 0 %
Lymphocytes Absolute: 2.7 10*3/uL (ref 0.7–3.1)
Lymphs: 34 %
MCH: 29.1 pg (ref 26.6–33.0)
MCHC: 34.4 g/dL (ref 31.5–35.7)
MCV: 85 fL (ref 79–97)
Monocytes Absolute: 0.9 10*3/uL (ref 0.1–0.9)
Monocytes: 11 %
Neutrophils Absolute: 4.3 10*3/uL (ref 1.4–7.0)
Neutrophils: 52 %
Platelets: 304 10*3/uL (ref 150–450)
RBC: 4.94 x10E6/uL (ref 4.14–5.80)
RDW: 12.8 % (ref 11.6–15.4)
WBC: 8.1 10*3/uL (ref 3.4–10.8)

## 2023-03-25 LAB — UA/M W/RFLX CULTURE, ROUTINE
Bilirubin, UA: NEGATIVE
Glucose, UA: NEGATIVE
Ketones, UA: NEGATIVE
Leukocytes,UA: NEGATIVE
Nitrite, UA: NEGATIVE
Protein,UA: NEGATIVE
RBC, UA: NEGATIVE
Specific Gravity, UA: 1.009 (ref 1.005–1.030)
Urobilinogen, Ur: 1 mg/dL (ref 0.2–1.0)
pH, UA: 6 (ref 5.0–7.5)

## 2023-03-25 LAB — HEMOGLOBIN A1C
Est. average glucose Bld gHb Est-mCnc: 123 mg/dL
Hgb A1c MFr Bld: 5.9 % — ABNORMAL HIGH (ref 4.8–5.6)

## 2023-03-25 LAB — MICROSCOPIC EXAMINATION
Bacteria, UA: NONE SEEN
Casts: NONE SEEN /lpf
Epithelial Cells (non renal): NONE SEEN /hpf (ref 0–10)
RBC, Urine: NONE SEEN /hpf (ref 0–2)
WBC, UA: NONE SEEN /hpf (ref 0–5)

## 2023-03-25 LAB — LIPID PANEL
Chol/HDL Ratio: 2.9 ratio (ref 0.0–5.0)
Cholesterol, Total: 103 mg/dL (ref 100–199)
HDL: 35 mg/dL — ABNORMAL LOW (ref >39)
LDL Chol Calc (NIH): 46 mg/dL (ref 0–99)
Triglycerides: 122 mg/dL (ref 0–149)
VLDL Cholesterol Cal: 22 mg/dL (ref 5–40)

## 2023-03-25 LAB — VITAMIN D 25 HYDROXY (VIT D DEFICIENCY, FRACTURES): Vit D, 25-Hydroxy: 36.6 ng/mL (ref 30.0–100.0)

## 2023-03-25 LAB — TSH: TSH: 1.37 u[IU]/mL (ref 0.450–4.500)

## 2023-03-25 LAB — PSA: Prostate Specific Ag, Serum: 0.7 ng/mL (ref 0.0–4.0)

## 2023-03-25 NOTE — Telephone Encounter (Signed)
Pt just received lab results on mychart. He is worried about them. Wants a call with results by end of day so they do not worry all weekend.

## 2023-03-25 NOTE — Telephone Encounter (Signed)
Spoke to wife

## 2023-03-29 ENCOUNTER — Encounter: Payer: Self-pay | Admitting: Internal Medicine

## 2023-03-29 ENCOUNTER — Ambulatory Visit (INDEPENDENT_AMBULATORY_CARE_PROVIDER_SITE_OTHER): Payer: Medicare Other | Admitting: Internal Medicine

## 2023-03-29 VITALS — BP 138/82 | HR 68 | Ht 68.0 in | Wt 192.8 lb

## 2023-03-29 DIAGNOSIS — G8929 Other chronic pain: Secondary | ICD-10-CM | POA: Insufficient documentation

## 2023-03-29 DIAGNOSIS — M546 Pain in thoracic spine: Secondary | ICD-10-CM | POA: Diagnosis not present

## 2023-03-29 DIAGNOSIS — M545 Low back pain, unspecified: Secondary | ICD-10-CM

## 2023-03-29 DIAGNOSIS — E871 Hypo-osmolality and hyponatremia: Secondary | ICD-10-CM

## 2023-03-29 DIAGNOSIS — I10 Essential (primary) hypertension: Secondary | ICD-10-CM | POA: Diagnosis not present

## 2023-03-29 DIAGNOSIS — F419 Anxiety disorder, unspecified: Secondary | ICD-10-CM

## 2023-03-29 MED ORDER — PREDNISONE 20 MG PO TABS: 20.0000 mg | ORAL_TABLET | Freq: Every day | ORAL | 0 refills | Status: DC

## 2023-03-29 MED ORDER — CYCLOBENZAPRINE HCL 5 MG PO TABS: 5.0000 mg | ORAL_TABLET | Freq: Two times a day (BID) | ORAL | 1 refills | Status: DC | PRN

## 2023-03-29 NOTE — Assessment & Plan Note (Signed)
Has chronic hyponatremia, likely due to SIADH from multiple psychiatric medications Also takes lisinopril for HTN, decreased dose to 20 mg daily Referred to nephrology for further evaluation Advised to maintain adequate hydration

## 2023-03-29 NOTE — Assessment & Plan Note (Signed)
Check x-ray of lumbar spine Prednisone 20 mg QD X 10 days Flexeril as needed for back muscle spasms Avoid heavy lifting and frequent bending Simple back exercises material provided

## 2023-03-29 NOTE — Assessment & Plan Note (Signed)
BP Readings from Last 1 Encounters:  03/29/23 138/82   Well-controlled with Lisinopril 20 mg QD and Imdur 60 mg QD Followed by cardiology Counseled for compliance with the medications Advised DASH diet and moderate exercise/walking, at least 150 mins/week

## 2023-03-29 NOTE — Progress Notes (Signed)
Established Patient Office Visit  Subjective:  Patient ID: Devin BITTEL, male    DOB: Apr 17, 1951  Age: 72 y.o. MRN: [id number]  CC:  Chief Complaint  Patient presents with   Flank Pain    Patient states he is having kidney pain, going on for a week now. Patient is also concerned from his previous bloodwork.    HPI Devin Daugherty is a 72 y.o. male with past medical history of HTN, CAD s/p stent placement, HLD, depression with anxiety, CKD stage 3a, and medication induced parkinsonism who presents for f/u of hyponatremia and back pain.  He was advised to decrease the dose of lisinopril to 20 mg daily due to hyponatremia.  Of note, he has history of chronic hyponatremia.  His last CMP showed Na - 128, lower from 132 in 02/24.  He has been worried about his kidney function, but it was noted to be better compared to prior.  He is fixated on the idea that his back pain is due to kidney problem.  He denies any dysuria or hematuria currently.  He has chronic low back pain, which is intermittent, worse with lying down and bending.  Denies any numbness or tingling of the LE.  Denies saddle anesthesia, urinary or stool incontinence.  HTN: BP is well-controlled. Takes medications regularly. Patient denies headache, chest pain, dyspnea or palpitations.  CAD: S/p stent placement, on Aspirin and statin, followed by Cardiology.  Denies any chest pain currently.   Depression with anxiety: Followed by Psychiatry. He is on multiple medications.  He has uncontrolled anxiety and constantly worries about his health.  Denies any SI or HI.  Past Medical History:  Diagnosis Date   Anxiety    CAD (coronary artery disease)    Depression    High cholesterol    Hypertension     Past Surgical History:  Procedure Laterality Date   COLONOSCOPY     CORONARY ANGIOPLASTY WITH STENT PLACEMENT  05/2018   Heart  Stent  2009   HERNIA REPAIR      Family History  Problem Relation Age of Onset   Heart  attack Father    Hyperlipidemia Son     Social History   Socioeconomic History   Marital status: Married    Spouse name: Not on file   Number of children: 2   Years of education: 14   Highest education level: Associate degree: occupational, Hotel manager, or vocational program  Occupational History   Occupation: retired    Fish farm manager: [employer]    Comment: welder  Tobacco Use   Smoking status: Never   Smokeless tobacco: Never  Vaping Use   Vaping Use: Never used  Substance and Sexual Activity   Alcohol use: No   Drug use: No   Sexual activity: Yes    Partners: Female    Birth control/protection: None    Comment: married  Other Topics Concern   Not on file  Social History Narrative   Married, lives at home with spouse. Multiple children and grandchildren. 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 Active in local church. Likes to travel with spouse to the beach.    Social Determinants of Health   Financial Resource Strain: Low Risk  (10/20/2021)   Overall Financial Resource Strain (CARDIA)    Difficulty of Paying Living Expenses: Not hard at all  Food Insecurity: No Food Insecurity (10/20/2021)   Hunger Vital Sign    Worried About Charity fundraiser in  the Last Year: Never true    [place] in the Last Year: Never true  Transportation Needs: No Transportation Needs (10/20/2021)   PRAPARE - Hydrologist (Medical): No    Lack of Transportation (Non-Medical): No  Physical Activity: Sufficiently Active (10/20/2021)   Exercise Vital Sign    Days of Exercise per Week: 7 days    Minutes of Exercise per Session: 60 min  Stress: No Stress Concern Present (10/20/2021)   [place]    Feeling of Stress : Not at all  Social Connections: Moderately Integrated (10/20/2021)   Social Connection and Isolation Panel [NHANES]    Frequency of Communication with Friends and  Family: Three times a week    Frequency of Social Gatherings with Friends and Family: Three times a week    Attends Religious Services: More than 4 times per year    Active Member of Clubs or Organizations: No    Attends Archivist Meetings: Never    Marital Status: Married  Human resources officer Violence: Not At Risk (10/20/2021)   Humiliation, Afraid, Rape, and Kick questionnaire    Fear of Current or Ex-Partner: No    Emotionally Abused: No    Physically Abused: No    Sexually Abused: No    Outpatient Medications Prior to Visit  Medication Sig Dispense Refill   ARIPiprazole (ABILIFY) 10 MG tablet Take by mouth.     busPIRone (BUSPAR) 30 MG tablet Take 1 tablet (30 mg total) by mouth 2 (two) times daily. 60 tablet 3   carbidopa-levodopa (SINEMET IR) 25-100 MG tablet Take 1 tablet by mouth 3 (three) times daily.     escitalopram (LEXAPRO) 20 MG tablet Take 1 tablet (20 mg total) by mouth daily. 30 tablet 3   hydrOXYzine (VISTARIL) 25 MG capsule Take by mouth.     isosorbide mononitrate (IMDUR) 60 MG 24 hr tablet TAKE 1 TABLET BY MOUTH DAILY 90 tablet 3   lisinopril (ZESTRIL) 40 MG tablet Take 20 mg by mouth daily.     LORazepam (ATIVAN) 1 MG tablet Take 1 tablet (1 mg total) by mouth 2 (two) times daily. 60 tablet 2   mirtazapine (REMERON) 30 MG tablet Take 1 tablet (30 mg total) by mouth at bedtime. 90 tablet 1   rosuvastatin (CRESTOR) 40 MG tablet TAKE 1 TABLET BY MOUTH DAILY 90 tablet 3   sildenafil (REVATIO) 20 MG tablet Take 2-3 tablets po 1-2 hrs before sex 24 tablet 5   zolpidem (AMBIEN) 10 MG tablet Take 1 tablet (10 mg total) by mouth at bedtime as needed for sleep. 30 tablet 0   No facility-administered medications prior to visit.    Allergies  Allergen Reactions   Codeine Itching    ROS Review of Systems  Constitutional:  Positive for fatigue. Negative for chills, fever and unexpected weight change.  HENT:  Negative for sinus pressure and sinus pain.   Eyes:   Negative for pain and discharge.  Respiratory:  Negative for cough and shortness of breath.   Cardiovascular:  Negative for chest pain and palpitations.  Gastrointestinal:  Negative for diarrhea, nausea and vomiting.  Genitourinary:  Negative for dysuria and hematuria.  Musculoskeletal:  Positive for back pain. Negative for neck pain and neck stiffness.       Left foot pain  Skin:  Negative for rash.  Neurological:  Negative for dizziness and seizures.  Psychiatric/Behavioral:  Negative for agitation  and behavioral problems.       Objective:    Physical Exam Vitals reviewed.  Constitutional:      General: He is not in acute distress.    Appearance: He is not diaphoretic.  HENT:     Head: Normocephalic and atraumatic.     Nose: Nose normal.     Mouth/Throat:     Mouth: Mucous membranes are moist.  Eyes:     General: No scleral icterus.    Extraocular Movements: Extraocular movements intact.  Cardiovascular:     Rate and Rhythm: Normal rate and regular rhythm.     Pulses: Normal pulses.     Heart sounds: Normal heart sounds. No murmur heard. Pulmonary:     Breath sounds: Normal breath sounds. No wheezing or rales.  Musculoskeletal:     Cervical back: Neck supple. No tenderness.     Thoracic back: Tenderness present.     Lumbar back: Tenderness present. Decreased range of motion.     Right lower leg: No edema.     Left lower leg: No edema.  Feet:     Left foot:     Skin integrity: Callus (Between second and third toe) present.  Skin:    General: Skin is warm.     Findings: No rash.  Neurological:     General: No focal deficit present.     Mental Status: He is alert and oriented to person, place, and time.  Psychiatric:        Mood and Affect: Mood normal.        Behavior: Behavior normal.     BP 138/82 (BP Location: Right Arm, Cuff Size: Normal)   Pulse 68   Ht 5\' 8"  (1.727 m)   Wt 192 lb 12.8 oz (87.5 kg)   SpO2 96%   BMI 29.32 kg/m  Wt Readings from Last 3  Encounters:  03/29/23 192 lb 12.8 oz (87.5 kg)  03/24/23 195 lb 3.2 oz (88.5 kg)  03/01/23 191 lb 0.6 oz (86.7 kg)    Lab Results  Component Value Date   TSH 1.370 03/24/2023   Lab Results  Component Value Date   WBC 8.1 03/24/2023   HGB 14.4 03/24/2023   HCT 41.8 03/24/2023   MCV 85 03/24/2023   PLT 304 03/24/2023   Lab Results  Component Value Date   NA 128 (L) 03/24/2023   K 5.0 03/24/2023   CO2 21 03/24/2023   GLUCOSE 102 (H) 03/24/2023   BUN 9 03/24/2023   CREATININE 1.21 03/24/2023   BILITOT 0.5 03/24/2023   ALKPHOS 104 03/24/2023   AST 20 03/24/2023   ALT 20 03/24/2023   PROT 6.9 03/24/2023   ALBUMIN 4.1 03/24/2023   CALCIUM 9.4 03/24/2023   ANIONGAP 11 02/09/2023   EGFR 63 03/24/2023   Lab Results  Component Value Date   CHOL 103 03/24/2023   Lab Results  Component Value Date   HDL 35 (L) 03/24/2023   Lab Results  Component Value Date   LDLCALC 46 03/24/2023   Lab Results  Component Value Date   TRIG 122 03/24/2023   Lab Results  Component Value Date   CHOLHDL 2.9 03/24/2023   Lab Results  Component Value Date   HGBA1C 5.9 (H) 03/24/2023      Assessment & Plan:   Problem List Items Addressed This Visit    Hyponatremia Has chronic hyponatremia, likely due to SIADH from multiple psychiatric medications Also takes lisinopril for HTN, decreased dose to 20 mg  daily Referred to nephrology for further evaluation Advised to maintain adequate hydration  Chronic bilateral thoracic back pain His back pain is in the thoracic area Had discussion that it is less likely related to kidney Check x-ray of thoracic spine Prednisone 20 mg QD X 10 days Flexeril as needed for back muscle spasms Avoid heavy lifting and frequent bending Simple back exercises material provided    Chronic bilateral low back pain Check x-ray of lumbar spine Prednisone 20 mg QD X 10 days Flexeril as needed for back muscle spasms Avoid heavy lifting and frequent  bending Simple back exercises material provided  Hypertension BP Readings from Last 1 Encounters:  03/29/23 138/82   Well-controlled with Lisinopril 20 mg QD and Imdur 60 mg QD Followed by cardiology Counseled for compliance with the medications Advised DASH diet and moderate exercise/walking, at least 150 mins/week  Anxiety Uncontrolled On Lexapro, Mirtazepine and Abilify- followed by Psychiatry at [facility] ordered this encounter  Medications   predniSONE (DELTASONE) 20 MG tablet    Sig: Take 1 tablet (20 mg total) by mouth daily with breakfast.    Dispense:  10 tablet    Refill:  0   cyclobenzaprine (FLEXERIL) 5 MG tablet    Sig: Take 1 tablet (5 mg total) by mouth 2 (two) times daily as needed for muscle spasms.    Dispense:  30 tablet    Refill:  1    Follow-up: Return if symptoms worsen or fail to improve.    Lindell Spar, MD

## 2023-03-29 NOTE — Assessment & Plan Note (Signed)
His back pain is in the thoracic area Had discussion that it is less likely related to kidney Check x-ray of thoracic spine Prednisone 20 mg QD X 10 days Flexeril as needed for back muscle spasms Avoid heavy lifting and frequent bending Simple back exercises material provided

## 2023-03-29 NOTE — Assessment & Plan Note (Signed)
Uncontrolled On Lexapro, Mirtazepine and Abilify- followed by Psychiatry at [facility] 

## 2023-03-29 NOTE — Patient Instructions (Addendum)
Please get X-rays of back done at [facility].  Please take Prednisone as prescribed.  Please take Flexeril as needed for back muscle spasms.  You are being referred to Nephrology for hyponatremia.

## 2023-03-31 DIAGNOSIS — I10 Essential (primary) hypertension: Secondary | ICD-10-CM | POA: Diagnosis not present

## 2023-03-31 DIAGNOSIS — G20A1 Parkinson's disease without dyskinesia, without mention of fluctuations: Secondary | ICD-10-CM | POA: Diagnosis not present

## 2023-04-04 DIAGNOSIS — M170 Bilateral primary osteoarthritis of knee: Secondary | ICD-10-CM | POA: Diagnosis not present

## 2023-04-13 DIAGNOSIS — E871 Hypo-osmolality and hyponatremia: Secondary | ICD-10-CM | POA: Diagnosis not present

## 2023-04-13 DIAGNOSIS — E559 Vitamin D deficiency, unspecified: Secondary | ICD-10-CM | POA: Diagnosis not present

## 2023-04-13 DIAGNOSIS — I129 Hypertensive chronic kidney disease with stage 1 through stage 4 chronic kidney disease, or unspecified chronic kidney disease: Secondary | ICD-10-CM | POA: Diagnosis not present

## 2023-04-13 DIAGNOSIS — N1831 Chronic kidney disease, stage 3a: Secondary | ICD-10-CM | POA: Diagnosis not present

## 2023-04-18 DIAGNOSIS — F332 Major depressive disorder, recurrent severe without psychotic features: Secondary | ICD-10-CM | POA: Diagnosis not present

## 2023-04-20 ENCOUNTER — Encounter: Payer: Self-pay | Admitting: Internal Medicine

## 2023-04-20 ENCOUNTER — Ambulatory Visit (INDEPENDENT_AMBULATORY_CARE_PROVIDER_SITE_OTHER): Payer: Medicare Other | Admitting: Internal Medicine

## 2023-04-20 VITALS — BP 136/78 | HR 68 | Ht 68.0 in | Wt 188.8 lb

## 2023-04-20 DIAGNOSIS — F419 Anxiety disorder, unspecified: Secondary | ICD-10-CM | POA: Diagnosis not present

## 2023-04-20 DIAGNOSIS — K8689 Other specified diseases of pancreas: Secondary | ICD-10-CM | POA: Diagnosis not present

## 2023-04-20 DIAGNOSIS — E871 Hypo-osmolality and hyponatremia: Secondary | ICD-10-CM

## 2023-04-20 NOTE — Assessment & Plan Note (Signed)
Uncontrolled Unclear if parkinsonism has effect on his mood On Lexapro, Mirtazepine and Abilify- followed by Psychiatry at [facility]

## 2023-04-20 NOTE — Progress Notes (Signed)
Established Patient Office Visit  Subjective:  Patient ID: Devin Daugherty, male    DOB: May 30, 1951  Age: 72 y.o. MRN: [id number]  CC:  Chief Complaint  Patient presents with   Stress    Patient states he is very stressed    HPI Devin Daugherty is a 72 y.o. male with past medical history of HTN, CAD s/p stent placement, HLD, depression with anxiety, CKD stage 3a, and medication induced parkinsonism who presents for f/u of hyponatremia and discussion of recent CT abdomen from hospitalization.  He recently saw Dr. Wolfgang Phoenix for hyponatremia.  He was advised to decrease free water intake.  His hyponatremia is also likely due to SIADH from his psychiatric medicines.  He still reports being anxious, but followed by psychiatry at [facility].  He was found to have incidental pancreatic lesion.  I explained about the lesion and need for follow-up MRI of abdomen (pancreatic protocol) and he agrees for it.  He currently denies epigastric pain, nausea or vomiting.  Past Medical History:  Diagnosis Date   Anxiety    CAD (coronary artery disease)    Depression    High cholesterol    Hypertension     Past Surgical History:  Procedure Laterality Date   COLONOSCOPY     CORONARY ANGIOPLASTY WITH STENT PLACEMENT  05/2018   Heart  Stent  2009   HERNIA REPAIR      Family History  Problem Relation Age of Onset   Heart attack Father    Hyperlipidemia Son     Social History   Socioeconomic History   Marital status: Married    Spouse name: Not on file   Number of children: 2   Years of education: 14   Highest education level: Associate degree: occupational, Scientist, product/process development, or vocational program  Occupational History   Occupation: retired    Associate Professor: [employer]    Comment: welder  Tobacco Use   Smoking status: Never   Smokeless tobacco: Never  Vaping Use   Vaping Use: Never used  Substance and Sexual Activity   Alcohol use: No   Drug use: No   Sexual activity: Yes     Partners: Female    Birth control/protection: None    Comment: married  Other Topics Concern   Not on file  Social History Narrative   Married, lives at home with spouse. Multiple children and grandchildren. 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 Active in local church. Likes to travel with spouse to the beach.    Social Determinants of Health   Financial Resource Strain: Low Risk  (10/20/2021)   Overall Financial Resource Strain (CARDIA)    Difficulty of Paying Living Expenses: Not hard at all  Food Insecurity: No Food Insecurity (10/20/2021)   Hunger Vital Sign    Worried About Running Out of Food in the Last Year: Never true    Ran Out of Food in the Last Year: Never true  Transportation Needs: No Transportation Needs (10/20/2021)   PRAPARE - Administrator, Civil Service (Medical): No    Lack of Transportation (Non-Medical): No  Physical Activity: Sufficiently Active (10/20/2021)   Exercise Vital Sign    Days of Exercise per Week: 7 days    Minutes of Exercise per Session: 60 min  Stress: No Stress Concern Present (10/20/2021)   [employer] of Occupational Health - Occupational Stress Questionnaire    Feeling of Stress : Not at all  Social Connections: Moderately  Integrated (10/20/2021)   Social Connection and Isolation Panel [NHANES]    Frequency of Communication with Friends and Family: Three times a week    Frequency of Social Gatherings with Friends and Family: Three times a week    Attends Religious Services: More than 4 times per year    Active Member of Clubs or Organizations: No    Attends Banker Meetings: Never    Marital Status: Married  Catering manager Violence: Not At Risk (10/20/2021)   Humiliation, Afraid, Rape, and Kick questionnaire    Fear of Current or Ex-Partner: No    Emotionally Abused: No    Physically Abused: No    Sexually Abused: No    Outpatient Medications Prior to Visit  Medication Sig Dispense  Refill   ARIPiprazole (ABILIFY) 10 MG tablet Take by mouth.     busPIRone (BUSPAR) 30 MG tablet Take 1 tablet (30 mg total) by mouth 2 (two) times daily. 60 tablet 3   carbidopa-levodopa (SINEMET IR) 25-100 MG tablet Take 1 tablet by mouth 3 (three) times daily.     cyclobenzaprine (FLEXERIL) 5 MG tablet Take 1 tablet (5 mg total) by mouth 2 (two) times daily as needed for muscle spasms. 30 tablet 1   escitalopram (LEXAPRO) 20 MG tablet Take 1 tablet (20 mg total) by mouth daily. 30 tablet 3   hydrOXYzine (VISTARIL) 25 MG capsule Take by mouth.     isosorbide mononitrate (IMDUR) 60 MG 24 hr tablet TAKE 1 TABLET BY MOUTH DAILY 90 tablet 3   lisinopril (ZESTRIL) 40 MG tablet Take 20 mg by mouth daily.     LORazepam (ATIVAN) 1 MG tablet Take 1 tablet (1 mg total) by mouth 2 (two) times daily. 60 tablet 2   mirtazapine (REMERON) 30 MG tablet Take 1 tablet (30 mg total) by mouth at bedtime. 90 tablet 1   predniSONE (DELTASONE) 20 MG tablet Take 1 tablet (20 mg total) by mouth daily with breakfast. 10 tablet 0   rosuvastatin (CRESTOR) 40 MG tablet TAKE 1 TABLET BY MOUTH DAILY 90 tablet 3   sildenafil (REVATIO) 20 MG tablet Take 2-3 tablets po 1-2 hrs before sex 24 tablet 5   zolpidem (AMBIEN) 10 MG tablet Take 1 tablet (10 mg total) by mouth at bedtime as needed for sleep. 30 tablet 0   No facility-administered medications prior to visit.    Allergies  Allergen Reactions   Codeine Itching    ROS Review of Systems  Constitutional:  Positive for fatigue. Negative for chills, fever and unexpected weight change.  HENT:  Negative for sinus pressure and sinus pain.   Eyes:  Negative for pain and discharge.  Respiratory:  Negative for cough and shortness of breath.   Cardiovascular:  Negative for chest pain and palpitations.  Gastrointestinal:  Negative for diarrhea, nausea and vomiting.  Genitourinary:  Negative for dysuria and hematuria.  Musculoskeletal:  Positive for back pain. Negative for  neck pain and neck stiffness.  Skin:  Negative for rash.  Neurological:  Positive for tremors. Negative for dizziness and seizures.  Psychiatric/Behavioral:  Positive for decreased concentration, dysphoric mood and sleep disturbance. Negative for agitation and behavioral problems. The patient is nervous/anxious.       Objective:    Physical Exam Vitals reviewed.  Constitutional:      General: He is not in acute distress.    Appearance: He is not diaphoretic.  HENT:     Head: Normocephalic and atraumatic.     Nose:  Nose normal.     Mouth/Throat:     Mouth: Mucous membranes are moist.  Eyes:     General: No scleral icterus.    Extraocular Movements: Extraocular movements intact.  Cardiovascular:     Rate and Rhythm: Normal rate and regular rhythm.     Pulses: Normal pulses.     Heart sounds: Normal heart sounds. No murmur heard. Pulmonary:     Breath sounds: Normal breath sounds. No wheezing or rales.  Abdominal:     Palpations: Abdomen is soft.     Tenderness: There is no abdominal tenderness.  Musculoskeletal:     Cervical back: Neck supple. No tenderness.     Thoracic back: Tenderness present.     Lumbar back: Tenderness present. Decreased range of motion.     Right lower leg: No edema.     Left lower leg: No edema.  Feet:     Left foot:     Skin integrity: Callus (Between second and third toe) present.  Skin:    General: Skin is warm.     Findings: No rash.  Neurological:     General: No focal deficit present.     Mental Status: He is alert and oriented to person, place, and time.     Motor: Tremor (b/l UE) present.  Psychiatric:        Mood and Affect: Mood is anxious.        Behavior: Behavior normal.     BP 136/78 (BP Location: Right Arm)   Pulse 68   Ht  (1.727 m)   Wt 188 lb 12.8 oz (85.6 kg)   SpO2 96%   BMI 28.71 kg/m  Wt Readings from Last 3 Encounters:  04/20/23 188 lb 12.8 oz (85.6 kg)  03/29/23 192 lb 12.8 oz (87.5 kg)  03/24/23 195 lb  3.2 oz (88.5 kg)    Lab Results  Component Value Date   TSH 1.370 03/24/2023   Lab Results  Component Value Date   WBC 8.1 03/24/2023   HGB 14.4 03/24/2023   HCT 41.8 03/24/2023   MCV 85 03/24/2023   PLT 304 03/24/2023   Lab Results  Component Value Date   NA 128 (L) 03/24/2023   K 5.0 03/24/2023   CO2 21 03/24/2023   GLUCOSE 102 (H) 03/24/2023   BUN 9 03/24/2023   CREATININE 1.21 03/24/2023   BILITOT 0.5 03/24/2023   ALKPHOS 104 03/24/2023   AST 20 03/24/2023   ALT 20 03/24/2023   PROT 6.9 03/24/2023   ALBUMIN 4.1 03/24/2023   CALCIUM 9.4 03/24/2023   ANIONGAP 11 02/09/2023   EGFR 63 03/24/2023   Lab Results  Component Value Date   CHOL 103 03/24/2023   Lab Results  Component Value Date   HDL 35 (L) 03/24/2023   Lab Results  Component Value Date   LDLCALC 46 03/24/2023   Lab Results  Component Value Date   TRIG 122 03/24/2023   Lab Results  Component Value Date   CHOLHDL 2.9 03/24/2023   Lab Results  Component Value Date   HGBA1C 5.9 (H) 03/24/2023      Assessment & Plan:   Problem List Items Addressed This Visit       Other   Anxiety (Chronic)    Uncontrolled Unclear if parkinsonism has effect on his mood On Lexapro, Mirtazepine and Abilify- followed by Psychiatry at [facility]      Hyponatremia    Has chronic hyponatremia, likely due to SIADH from multiple  psychiatric medications and volume overload from free water intake Also takes lisinopril for HTN, decreased dose to 20 mg daily Referred to nephrology for further evaluation Advised to maintain adequate hydration      Pancreatic mass - Primary    Noted incidentally on CT abdomen during recent hospitalization Currently asymptomatic - denies epigastric pain, nausea or vomiting Check MRI abdomen pancreatic protocol      Relevant Orders   MR Abdomen W Wo Contrast    No orders of the defined types were placed in this encounter.   Follow-up: Return if symptoms worsen or fail  to improve.    Anabel Halon, MD

## 2023-04-20 NOTE — Patient Instructions (Signed)
Please get MRI of pancreas done as scheduled.  Please continue to take medications as prescribed.

## 2023-04-20 NOTE — Assessment & Plan Note (Signed)
Noted incidentally on CT abdomen during recent hospitalization Currently asymptomatic - denies epigastric pain, nausea or vomiting Check MRI abdomen pancreatic protocol

## 2023-04-20 NOTE — Assessment & Plan Note (Addendum)
Has chronic hyponatremia, likely due to SIADH from multiple psychiatric medications and volume overload from free water intake Also takes lisinopril for HTN, decreased dose to 20 mg daily Referred to nephrology for further evaluation Advised to maintain adequate hydration

## 2023-04-21 DIAGNOSIS — F451 Undifferentiated somatoform disorder: Secondary | ICD-10-CM | POA: Diagnosis not present

## 2023-04-21 DIAGNOSIS — F411 Generalized anxiety disorder: Secondary | ICD-10-CM | POA: Diagnosis not present

## 2023-04-21 DIAGNOSIS — F1320 Sedative, hypnotic or anxiolytic dependence, uncomplicated: Secondary | ICD-10-CM | POA: Diagnosis not present

## 2023-04-21 DIAGNOSIS — F429 Obsessive-compulsive disorder, unspecified: Secondary | ICD-10-CM | POA: Diagnosis not present

## 2023-04-21 DIAGNOSIS — F410 Panic disorder [episodic paroxysmal anxiety] without agoraphobia: Secondary | ICD-10-CM | POA: Diagnosis not present

## 2023-04-21 DIAGNOSIS — F331 Major depressive disorder, recurrent, moderate: Secondary | ICD-10-CM | POA: Diagnosis not present

## 2023-05-03 ENCOUNTER — Ambulatory Visit: Payer: Medicare Other | Admitting: Behavioral Health

## 2023-05-12 ENCOUNTER — Ambulatory Visit (HOSPITAL_BASED_OUTPATIENT_CLINIC_OR_DEPARTMENT_OTHER): Payer: Medicare Other

## 2023-05-26 DIAGNOSIS — F5105 Insomnia due to other mental disorder: Secondary | ICD-10-CM | POA: Diagnosis not present

## 2023-05-26 DIAGNOSIS — F410 Panic disorder [episodic paroxysmal anxiety] without agoraphobia: Secondary | ICD-10-CM | POA: Diagnosis not present

## 2023-05-26 DIAGNOSIS — F429 Obsessive-compulsive disorder, unspecified: Secondary | ICD-10-CM | POA: Diagnosis not present

## 2023-05-26 DIAGNOSIS — R4189 Other symptoms and signs involving cognitive functions and awareness: Secondary | ICD-10-CM | POA: Diagnosis not present

## 2023-05-26 DIAGNOSIS — F411 Generalized anxiety disorder: Secondary | ICD-10-CM | POA: Diagnosis not present

## 2023-05-26 DIAGNOSIS — F331 Major depressive disorder, recurrent, moderate: Secondary | ICD-10-CM | POA: Diagnosis not present

## 2023-06-02 DIAGNOSIS — E559 Vitamin D deficiency, unspecified: Secondary | ICD-10-CM | POA: Diagnosis not present

## 2023-06-02 DIAGNOSIS — N189 Chronic kidney disease, unspecified: Secondary | ICD-10-CM | POA: Diagnosis not present

## 2023-06-02 DIAGNOSIS — Z79899 Other long term (current) drug therapy: Secondary | ICD-10-CM | POA: Diagnosis not present

## 2023-06-02 DIAGNOSIS — N1831 Chronic kidney disease, stage 3a: Secondary | ICD-10-CM | POA: Diagnosis not present

## 2023-06-06 ENCOUNTER — Ambulatory Visit: Payer: Medicare Other | Admitting: Internal Medicine

## 2023-06-20 DIAGNOSIS — F32A Depression, unspecified: Secondary | ICD-10-CM | POA: Diagnosis not present

## 2023-06-20 DIAGNOSIS — R419 Unspecified symptoms and signs involving cognitive functions and awareness: Secondary | ICD-10-CM | POA: Diagnosis not present

## 2023-06-20 DIAGNOSIS — F419 Anxiety disorder, unspecified: Secondary | ICD-10-CM | POA: Diagnosis not present

## 2023-06-22 DIAGNOSIS — N182 Chronic kidney disease, stage 2 (mild): Secondary | ICD-10-CM | POA: Diagnosis not present

## 2023-06-22 DIAGNOSIS — I129 Hypertensive chronic kidney disease with stage 1 through stage 4 chronic kidney disease, or unspecified chronic kidney disease: Secondary | ICD-10-CM | POA: Diagnosis not present

## 2023-06-22 DIAGNOSIS — K8689 Other specified diseases of pancreas: Secondary | ICD-10-CM | POA: Diagnosis not present

## 2023-06-22 DIAGNOSIS — E559 Vitamin D deficiency, unspecified: Secondary | ICD-10-CM | POA: Diagnosis not present

## 2023-06-24 DIAGNOSIS — F32A Depression, unspecified: Secondary | ICD-10-CM | POA: Diagnosis not present

## 2023-06-24 DIAGNOSIS — F419 Anxiety disorder, unspecified: Secondary | ICD-10-CM | POA: Diagnosis not present

## 2023-06-24 DIAGNOSIS — R419 Unspecified symptoms and signs involving cognitive functions and awareness: Secondary | ICD-10-CM | POA: Diagnosis not present

## 2023-07-21 DIAGNOSIS — F331 Major depressive disorder, recurrent, moderate: Secondary | ICD-10-CM | POA: Diagnosis not present

## 2023-07-21 DIAGNOSIS — E559 Vitamin D deficiency, unspecified: Secondary | ICD-10-CM | POA: Diagnosis not present

## 2023-07-21 DIAGNOSIS — R202 Paresthesia of skin: Secondary | ICD-10-CM | POA: Diagnosis not present

## 2023-07-21 DIAGNOSIS — F429 Obsessive-compulsive disorder, unspecified: Secondary | ICD-10-CM | POA: Diagnosis not present

## 2023-07-21 DIAGNOSIS — M791 Myalgia, unspecified site: Secondary | ICD-10-CM | POA: Diagnosis not present

## 2023-07-21 DIAGNOSIS — E538 Deficiency of other specified B group vitamins: Secondary | ICD-10-CM | POA: Diagnosis not present

## 2023-07-21 DIAGNOSIS — F411 Generalized anxiety disorder: Secondary | ICD-10-CM | POA: Diagnosis not present

## 2023-07-21 DIAGNOSIS — F410 Panic disorder [episodic paroxysmal anxiety] without agoraphobia: Secondary | ICD-10-CM | POA: Diagnosis not present

## 2023-07-21 DIAGNOSIS — F5105 Insomnia due to other mental disorder: Secondary | ICD-10-CM | POA: Diagnosis not present

## 2023-07-25 ENCOUNTER — Ambulatory Visit: Payer: Medicare Other | Admitting: Internal Medicine

## 2023-07-26 ENCOUNTER — Encounter: Payer: Self-pay | Admitting: Internal Medicine

## 2023-07-27 DIAGNOSIS — R202 Paresthesia of skin: Secondary | ICD-10-CM | POA: Diagnosis not present

## 2023-08-31 DIAGNOSIS — G20A1 Parkinson's disease without dyskinesia, without mention of fluctuations: Secondary | ICD-10-CM | POA: Diagnosis not present

## 2023-09-01 DIAGNOSIS — F1320 Sedative, hypnotic or anxiolytic dependence, uncomplicated: Secondary | ICD-10-CM | POA: Diagnosis not present

## 2023-09-01 DIAGNOSIS — F429 Obsessive-compulsive disorder, unspecified: Secondary | ICD-10-CM | POA: Diagnosis not present

## 2023-09-01 DIAGNOSIS — F5105 Insomnia due to other mental disorder: Secondary | ICD-10-CM | POA: Diagnosis not present

## 2023-09-01 DIAGNOSIS — F410 Panic disorder [episodic paroxysmal anxiety] without agoraphobia: Secondary | ICD-10-CM | POA: Diagnosis not present

## 2023-09-01 DIAGNOSIS — F411 Generalized anxiety disorder: Secondary | ICD-10-CM | POA: Diagnosis not present

## 2023-09-01 DIAGNOSIS — F331 Major depressive disorder, recurrent, moderate: Secondary | ICD-10-CM | POA: Diagnosis not present

## 2023-10-07 ENCOUNTER — Other Ambulatory Visit: Payer: Self-pay | Admitting: Internal Medicine

## 2023-10-07 DIAGNOSIS — E782 Mixed hyperlipidemia: Secondary | ICD-10-CM

## 2023-10-07 DIAGNOSIS — I2510 Atherosclerotic heart disease of native coronary artery without angina pectoris: Secondary | ICD-10-CM

## 2023-10-13 DIAGNOSIS — F32A Depression, unspecified: Secondary | ICD-10-CM | POA: Diagnosis not present

## 2023-10-13 DIAGNOSIS — G20A1 Parkinson's disease without dyskinesia, without mention of fluctuations: Secondary | ICD-10-CM | POA: Diagnosis not present

## 2023-10-13 DIAGNOSIS — F419 Anxiety disorder, unspecified: Secondary | ICD-10-CM | POA: Diagnosis not present

## 2023-10-13 DIAGNOSIS — Z79899 Other long term (current) drug therapy: Secondary | ICD-10-CM | POA: Diagnosis not present

## 2023-10-13 DIAGNOSIS — I2510 Atherosclerotic heart disease of native coronary artery without angina pectoris: Secondary | ICD-10-CM | POA: Diagnosis not present

## 2023-10-13 DIAGNOSIS — I10 Essential (primary) hypertension: Secondary | ICD-10-CM | POA: Diagnosis not present

## 2023-10-20 DIAGNOSIS — F1021 Alcohol dependence, in remission: Secondary | ICD-10-CM | POA: Diagnosis not present

## 2023-10-20 DIAGNOSIS — F411 Generalized anxiety disorder: Secondary | ICD-10-CM | POA: Diagnosis not present

## 2023-10-20 DIAGNOSIS — F410 Panic disorder [episodic paroxysmal anxiety] without agoraphobia: Secondary | ICD-10-CM | POA: Diagnosis not present

## 2023-10-20 DIAGNOSIS — F1320 Sedative, hypnotic or anxiolytic dependence, uncomplicated: Secondary | ICD-10-CM | POA: Diagnosis not present

## 2023-10-20 DIAGNOSIS — F5105 Insomnia due to other mental disorder: Secondary | ICD-10-CM | POA: Diagnosis not present

## 2023-10-20 DIAGNOSIS — F332 Major depressive disorder, recurrent severe without psychotic features: Secondary | ICD-10-CM | POA: Diagnosis not present

## 2023-11-16 ENCOUNTER — Ambulatory Visit: Payer: Self-pay | Admitting: Internal Medicine

## 2023-11-16 ENCOUNTER — Encounter: Payer: Self-pay | Admitting: Internal Medicine

## 2023-11-16 ENCOUNTER — Ambulatory Visit: Payer: Medicare Other | Admitting: Internal Medicine

## 2023-11-16 VITALS — BP 109/74 | HR 60 | Ht 68.0 in | Wt 208.2 lb

## 2023-11-16 DIAGNOSIS — R7303 Prediabetes: Secondary | ICD-10-CM | POA: Diagnosis not present

## 2023-11-16 DIAGNOSIS — G2111 Neuroleptic induced parkinsonism: Secondary | ICD-10-CM | POA: Diagnosis not present

## 2023-11-16 DIAGNOSIS — T43505A Adverse effect of unspecified antipsychotics and neuroleptics, initial encounter: Secondary | ICD-10-CM

## 2023-11-16 DIAGNOSIS — K8689 Other specified diseases of pancreas: Secondary | ICD-10-CM

## 2023-11-16 DIAGNOSIS — R252 Cramp and spasm: Secondary | ICD-10-CM | POA: Insufficient documentation

## 2023-11-16 DIAGNOSIS — F331 Major depressive disorder, recurrent, moderate: Secondary | ICD-10-CM

## 2023-11-16 DIAGNOSIS — R208 Other disturbances of skin sensation: Secondary | ICD-10-CM

## 2023-11-16 DIAGNOSIS — F419 Anxiety disorder, unspecified: Secondary | ICD-10-CM

## 2023-11-16 DIAGNOSIS — I10 Essential (primary) hypertension: Secondary | ICD-10-CM

## 2023-11-16 NOTE — Assessment & Plan Note (Signed)
Noted incidentally on CT abdomen during recent hospitalization Currently asymptomatic - denies epigastric pain, nausea or vomiting Ordered MRI abdomen pancreatic protocol - but he does not want to pursue any further investigation for now

## 2023-11-16 NOTE — Assessment & Plan Note (Signed)
BP Readings from Last 1 Encounters:  11/16/23 109/74   Well-controlled with Lisinopril 20 mg QD and Imdur 60 mg QD Followed by cardiology Counseled for compliance with the medications Advised DASH diet and moderate exercise/walking, at least 150 mins/week

## 2023-11-16 NOTE — Assessment & Plan Note (Addendum)
Uncontrolled Unclear if parkinsonism has effect on his mood On Luvox 50 mg QAM and 100 mg QHS, Mirtazepine 15 mg qHS and Seroquel 200 mg qHS - followed by Psychiatry at [facility] Also has Ativan 1 mg BID for GAD and Ambien 10 mg at bedtime for insomnia

## 2023-11-16 NOTE — Assessment & Plan Note (Signed)
Tremors likely due to antipsychotic On Sinemet currently He is recently placed on Seroquel, followed by psychiatry and neurology

## 2023-11-16 NOTE — Progress Notes (Signed)
Established Patient Office Visit  Subjective:  Patient ID: Devin Daugherty, male    DOB: 07/17/1951  Age: 72 y.o. MRN: [id number]  CC:  Chief Complaint  Patient presents with   Leg Pain    Left leg pain    feet burning     Bottom of feet burning   bloodwork     Blood work from [employer], concerned on sugar levels    HPI Devin Daugherty is a 72 y.o. male with past medical history of HTN, CAD s/p stent placement, HLD, depression with anxiety, CKD stage 3a, and medication induced parkinsonism who presents for f/u of his chronic medical conditions and burning pain in feet.  He reports bilateral foot burning sensation and intermittent tingling as well.  He had NCS and EMG, which were unremarkable.  Denies any recent injury.  He also reports bilateral leg cramps, which are chronic and intermittent.  Of note, he has history of alcohol abuse and hyponatremia.  He went to ER on 10/13/23 for severe anxiety.  He had CMP done, which showed random blood glucose of 129.  He thinks that it was very high.  I explained to him again that he has prediabetes and does not need any specific treatment for now. His HbA1c is 6.1 today.  HTN: BP is well-controlled. Takes medications regularly. Patient denies headache, chest pain, dyspnea or palpitations.  CAD: S/p stent placement, on Aspirin and statin, followed by Cardiology.  Denies any chest pain currently.   Depression with anxiety: Followed by Psychiatry. He is on multiple medications. Denies any SI or HI.  He has had hospitalization for suicidal ideation/intentional drug overdose of carvedilol and lisinopril in 02/18.  He was admitted for inpatient psychiatry evaluation and has seen psychiatrist at [facility].  He is placed on Seroquel, Luvox and Remeron.  He also takes Ativan 1 mg twice daily, but he still has severe anxiety.  He still complains of apathy, severe anxiety, insomnia and decreased concentration.  Past Medical History:  Diagnosis Date    Anxiety    CAD (coronary artery disease)    Depression    High cholesterol    Hypertension     Past Surgical History:  Procedure Laterality Date   COLONOSCOPY     CORONARY ANGIOPLASTY WITH STENT PLACEMENT  05/2018   Heart  Stent  2009   HERNIA REPAIR      Family History  Problem Relation Age of Onset   Heart attack Father    Hyperlipidemia Son     Social History   Socioeconomic History   Marital status: Married    Spouse name: Not on file   Number of children: 2   Years of education: 14   Highest education level: Associate degree: occupational, Scientist, product/process development, or vocational program  Occupational History   Occupation: retired    Associate Professor: [employer]    Comment: welder  Tobacco Use   Smoking status: Never   Smokeless tobacco: Never  Vaping Use   Vaping status: Never Used  Substance and Sexual Activity   Alcohol use: No   Drug use: No   Sexual activity: Yes    Partners: Female    Birth control/protection: None    Comment: married  Other Topics Concern   Not on file  Social History Narrative   Married, lives at home with spouse. Multiple children and grandchildren. 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 Active in local church. Likes to travel with [place] to [place]  beach.    Social Determinants of Health   Financial Resource Strain: Low Risk  (01/19/2023)   Received from [facility], [facility]   Overall Financial Resource Strain (CARDIA)    Difficulty of Paying Living Expenses: Not very hard  Food Insecurity: No Food Insecurity (01/19/2023)   Received from [facility], [facility]   Hunger Vital Sign    Worried About Running Out of Food in the Last Year: Never true    Ran Out of Food in the Last Year: Never true  Transportation Needs: No Transportation Needs (01/19/2023)   Received from [facility], [facility]   PRAPARE - Transportation    Lack of Transportation (Medical): No    Lack of Transportation (Non-Medical): No  Physical  Activity: Insufficiently Active (09/08/2022)   Received from [facility]   Exercise Vital Sign    Days of Exercise per Week: 2 days    Minutes of Exercise per Session: 30 min  Stress: Stress Concern Present (09/08/2022)   Received from [facility], [facility] - Occupational Stress Questionnaire    Feeling of Stress : Very much  Social Connections: Unknown (09/10/2023)   Received from [facility]   Social Network    Social Network: Not on file  Intimate Partner Violence: Not At Risk (10/13/2023)   Received from [facility]   HITS    Over the last 12 months how often did your partner physically hurt you?: Never    Over the last 12 months how often did your partner insult you or talk down to you?: Never    Over the last 12 months how often did your partner threaten you with physical harm?: Never    Over the last 12 months how often did your partner scream or curse at you?: Never    Outpatient Medications Prior to Visit  Medication Sig Dispense Refill   fluvoxaMINE (LUVOX) 100 MG tablet Take 100 mg by mouth at bedtime.     fluvoxaMINE (LUVOX) 50 MG tablet Take 50 mg by mouth in the morning.     mirtazapine (REMERON SOL-TAB) 30 MG disintegrating tablet Take 15 mg by mouth at bedtime.     QUEtiapine (SEROQUEL) 200 MG tablet Take 200 mg by mouth at bedtime.     zolpidem (AMBIEN) 10 MG tablet Take 10 mg by mouth at bedtime.     carbidopa-levodopa (SINEMET IR) 25-100 MG tablet Take 1 tablet by mouth 3 (three) times daily.     cyclobenzaprine (FLEXERIL) 5 MG tablet Take 1 tablet (5 mg total) by mouth 2 (two) times daily as needed for muscle spasms. 30 tablet 1   hydrOXYzine (VISTARIL) 25 MG capsule Take by mouth.     isosorbide mononitrate (IMDUR) 60 MG 24 hr tablet TAKE 1 TABLET BY MOUTH DAILY 90 tablet 3   lisinopril (ZESTRIL) 40 MG tablet Take 20 mg by mouth daily.     LORazepam (ATIVAN) 1 MG tablet Take 1 tablet (1 mg total) by mouth 2  (two) times daily. 60 tablet 2   predniSONE (DELTASONE) 20 MG tablet Take 1 tablet (20 mg total) by mouth daily with breakfast. 10 tablet 0   rosuvastatin (CRESTOR) 40 MG tablet TAKE 1 TABLET BY MOUTH DAILY 90 tablet 3   sildenafil (REVATIO) 20 MG tablet Take 2-3 tablets po 1-2 hrs before sex 24 tablet 5   zolpidem (AMBIEN) 10 MG tablet Take 1 tablet (10 mg total) by mouth at bedtime as needed  for sleep. 30 tablet 0   ARIPiprazole (ABILIFY) 10 MG tablet Take by mouth.     busPIRone (BUSPAR) 30 MG tablet Take 1 tablet (30 mg total) by mouth 2 (two) times daily. 60 tablet 3   escitalopram (LEXAPRO) 20 MG tablet Take 1 tablet (20 mg total) by mouth daily. 30 tablet 3   mirtazapine (REMERON) 30 MG tablet Take 1 tablet (30 mg total) by mouth at bedtime. 90 tablet 1   No facility-administered medications prior to visit.    Allergies  Allergen Reactions   Codeine Itching    ROS Review of Systems  Constitutional:  Positive for fatigue. Negative for chills, fever and unexpected weight change.  HENT:  Negative for sinus pressure and sinus pain.   Eyes:  Negative for pain and discharge.  Respiratory:  Negative for cough and shortness of breath.   Cardiovascular:  Negative for chest pain and palpitations.  Gastrointestinal:  Negative for diarrhea, nausea and vomiting.  Genitourinary:  Negative for dysuria and hematuria.  Musculoskeletal:  Positive for back pain. Negative for neck pain and neck stiffness.  Skin:  Negative for rash.  Neurological:  Positive for tremors and numbness (B/l feet). Negative for dizziness and seizures.  Psychiatric/Behavioral:  Positive for decreased concentration, dysphoric mood and sleep disturbance. Negative for agitation and behavioral problems. The patient is nervous/anxious.       Objective:    Physical Exam Vitals reviewed.  Constitutional:      General: He is not in acute distress.    Appearance: He is not diaphoretic.  HENT:     Head: Normocephalic and  atraumatic.     Nose: Nose normal.     Mouth/Throat:     Mouth: Mucous membranes are moist.  Eyes:     General: No scleral icterus.    Extraocular Movements: Extraocular movements intact.  Cardiovascular:     Rate and Rhythm: Normal rate and regular rhythm.     Pulses: Normal pulses.     Heart sounds: Normal heart sounds. No murmur heard. Pulmonary:     Breath sounds: Normal breath sounds. No wheezing or rales.  Musculoskeletal:     Cervical back: Neck supple. No tenderness.     Thoracic back: Tenderness present.     Lumbar back: Tenderness present. Decreased range of motion.     Right lower leg: No edema.     Left lower leg: No edema.  Feet:     Left foot:     Skin integrity: Callus (Between second and third toe) present.  Skin:    General: Skin is warm.     Findings: No rash.  Neurological:     General: No focal deficit present.     Mental Status: He is alert and oriented to person, place, and time.     Motor: Tremor (b/l UE) present.  Psychiatric:        Mood and Affect: Mood is anxious.        Behavior: Behavior normal.     BP 109/74 (BP Location: Right Arm, Patient Position: Sitting, Cuff Size: Normal)   Pulse 60   Ht 5\' 8"  (1.727 m)   Wt 208 lb 3.2 oz (94.4 kg)   SpO2 94%   BMI 31.66 kg/m  Wt Readings from Last 3 Encounters:  11/16/23 208 lb 3.2 oz (94.4 kg)  04/20/23 188 lb 12.8 oz (85.6 kg)  03/29/23 192 lb 12.8 oz (87.5 kg)    Lab Results  Component Value Date   TSH  1.370 03/24/2023   Lab Results  Component Value Date   WBC 8.1 03/24/2023   HGB 14.4 03/24/2023   HCT 41.8 03/24/2023   MCV 85 03/24/2023   PLT 304 03/24/2023   Lab Results  Component Value Date   NA 128 (L) 03/24/2023   K 5.0 03/24/2023   CO2 21 03/24/2023   GLUCOSE 102 (H) 03/24/2023   BUN 9 03/24/2023   CREATININE 1.21 03/24/2023   BILITOT 0.5 03/24/2023   ALKPHOS 104 03/24/2023   AST 20 03/24/2023   ALT 20 03/24/2023   PROT 6.9 03/24/2023   ALBUMIN 4.1 03/24/2023    CALCIUM 9.4 03/24/2023   ANIONGAP 11 02/09/2023   EGFR 63 03/24/2023   Lab Results  Component Value Date   CHOL 103 03/24/2023   Lab Results  Component Value Date   HDL 35 (L) 03/24/2023   Lab Results  Component Value Date   LDLCALC 46 03/24/2023   Lab Results  Component Value Date   TRIG 122 03/24/2023   Lab Results  Component Value Date   CHOLHDL 2.9 03/24/2023   Lab Results  Component Value Date   HGBA1C 5.9 (H) 03/24/2023      Assessment & Plan:   Problem List Items Addressed This Visit       Cardiovascular and Mediastinum   Hypertension (Chronic)    BP Readings from Last 1 Encounters:  11/16/23 109/74   Well-controlled with Lisinopril 20 mg QD and Imdur 60 mg QD Followed by cardiology Counseled for compliance with the medications Advised DASH diet and moderate exercise/walking, at least 150 mins/week        Nervous and Auditory   Neuroleptic-induced parkinsonism (HCC)    Tremors likely due to antipsychotic On Sinemet currently He is recently placed on Seroquel, followed by psychiatry and neurology        Other   Anxiety (Chronic)    Uncontrolled Unclear if parkinsonism has effect on his mood On Luvox 50 mg QAM and 100 mg QHS, Mirtazepine 15 mg qHS and Seroquel 200 mg qHS - followed by Psychiatry at [facility] Also has Ativan 1 mg BID for GAD and Ambien 10 mg at bedtime for insomnia      Relevant Medications   fluvoxaMINE (LUVOX) 50 MG tablet   fluvoxaMINE (LUVOX) 100 MG tablet   mirtazapine (REMERON SOL-TAB) 30 MG disintegrating tablet   MDD (major depressive disorder), recurrent, moderate episode (Chronic)    Uncontrolled On Luvox 50 mg QAM and 100 mg QHS, Mirtazepine 15 mg qHS and Seroquel 200 mg qHS - followed by Psychiatry at [facility] Also has Ativan 1 mg BID for GAD and Ambien 10 mg at bedtime for insomnia      Relevant Medications   fluvoxaMINE (LUVOX) 50 MG tablet   fluvoxaMINE (LUVOX) 100 MG tablet   mirtazapine (REMERON  SOL-TAB) 30 MG disintegrating tablet   Prediabetes - Primary    Lab Results  Component Value Date   HGBA1C 5.9 (H) 03/24/2023   HbA1c: 6.1 today Reassured that he does not have diabetes and needs to just do diet modification for now      Relevant Orders   Bayer DCA Hb A1c Waived   Pancreatic mass    Noted incidentally on CT abdomen during recent hospitalization Currently asymptomatic - denies epigastric pain, nausea or vomiting Ordered MRI abdomen pancreatic protocol - but he does not want to pursue any further investigation for now      Burning sensation of foot  Unclear if anxiety has impact on his symptoms Had NCS and EMG, which were unremarkable Advised to try vitamin B12 supplement, magnesium and co-Q10 for leg cramps      Leg cramps    Electrolyte abnormalities likely reason for leg cramps - history of alcohol abuse and hyponatremia Has been  advised to take thiamine supplement by psychiatry Advised to take magnesium and co-Q10 for leg cramps Can also try tonic water as needed       No orders of the defined types were placed in this encounter.   Follow-up: Return in about 5 months (around 04/15/2024).    Anabel Halon, MD

## 2023-11-16 NOTE — Assessment & Plan Note (Addendum)
Electrolyte abnormalities likely reason for leg cramps - history of alcohol abuse and hyponatremia Has been  advised to take thiamine supplement by psychiatry Advised to take magnesium and co-Q10 for leg cramps Can also try tonic water as needed

## 2023-11-16 NOTE — Telephone Encounter (Signed)
Copied from CRM [id number]. Topic: Clinical - Red Word Triage >> Nov 16, 2023  9:05 AM Prudencio Pair wrote: Red Word that prompted transfer to Nurse Triage: Pt's wife, Darl Pikes, calling and stating that pt went to ER a month ago and his blood sugar was really high and has been feeling bad for a few months now. Also his left leg has been giving him a hard time and he can't get comfortable. Has no way of checking blood glucose.   Chief Complaint: Left leg pain Symptoms: leg cramping in calf area Frequency: has been going on for month, getting worse this past week Pertinent Negatives: Patient denies swelling or new injuries Disposition: [] ED /[] Urgent Care (no appt availability in office) / [x] Appointment(In office/virtual)/ []  [facility]/ [] Home Care/ [] Refused Recommended Disposition /[] [facility]/ []  Follow-up with PCP Additional Notes: Pts wife reports concern for blood sugar. Sts that his blood glucose in a recent ER visit was 129. Pt does not have a glucometer at home.    Reason for Disposition  [1] MODERATE pain (e.g., interferes with normal activities, limping) AND [2] present > 3 days  Answer Assessment - Initial Assessment Questions 1. ONSET: "When did the pain start?"      Has been going on for 3-4 months, getting worse over the past week  2. LOCATION: "Where is the pain located?"      Left, pain moves from calf to the side  3. PAIN: "How bad is the pain?"    (Scale 1-10; or mild, moderate, severe)   -  MILD (1-3): doesn't interfere with normal activities    -  MODERATE (4-7): interferes with normal activities (e.g., work or school) or awakens from sleep, limping    -  SEVERE (8-10): excruciating pain, unable to do any normal activities, unable to walk     8/pain; walks some but has other issues  4. WORK OR EXERCISE: "Has there been any recent work or exercise that involved this part of the body?"      No  5. CAUSE: "What do you think is causing the leg  pain?"     Not sure, but its a constant cramping pain  6. OTHER SYMPTOMS: "Do you have any other symptoms?" (e.g., chest pain, back pain, breathing difficulty, swelling, rash, fever, numbness, weakness)     No other symptoms  7. PREGNANCY: "Is there any chance you are pregnant?" "When was your last menstrual period?"     No  Protocols used: Leg Pain-A-AH

## 2023-11-16 NOTE — Assessment & Plan Note (Signed)
Unclear if anxiety has impact on his symptoms Had NCS and EMG, which were unremarkable Advised to try vitamin B12 supplement, magnesium and co-Q10 for leg cramps

## 2023-11-16 NOTE — Assessment & Plan Note (Signed)
Uncontrolled On Luvox 50 mg QAM and 100 mg QHS, Mirtazepine 15 mg qHS and Seroquel 200 mg qHS - followed by Psychiatry at [facility] Also has Ativan 1 mg BID for GAD and Ambien 10 mg at bedtime for insomnia

## 2023-11-16 NOTE — Assessment & Plan Note (Signed)
Lab Results  Component Value Date   HGBA1C 5.9 (H) 03/24/2023   HbA1c: 6.1 today Reassured that he does not have diabetes and needs to just do diet modification for now

## 2023-11-16 NOTE — Patient Instructions (Signed)
Please start taking magnesium 250 mg once daily and CoQ10 - 100 mg once daily.  Please take Vitamin B12 500 mcg once daily.  Okay to take tonic water as needed for leg cramps.  Please continue to take medications as prescribed.  Please continue to follow heart healthy diet and ambulate as tolerated.

## 2023-11-17 LAB — BAYER DCA HB A1C WAIVED: HB A1C (BAYER DCA - WAIVED): 6.1 % — ABNORMAL HIGH (ref 4.8–5.6)

## 2023-11-22 ENCOUNTER — Telehealth: Payer: Self-pay

## 2023-11-22 NOTE — Telephone Encounter (Signed)
Patient advised.

## 2023-11-22 NOTE — Telephone Encounter (Signed)
Copied from CRM [id number]. Topic: Clinical - Lab/Test Results >> Nov 22, 2023  3:44 PM Sasha H wrote: Reason for CRM: Pt wants a call back about recent lab results

## 2023-11-29 ENCOUNTER — Ambulatory Visit (INDEPENDENT_AMBULATORY_CARE_PROVIDER_SITE_OTHER): Payer: Medicare Other

## 2023-11-29 VITALS — Ht 68.0 in | Wt 206.0 lb

## 2023-11-29 DIAGNOSIS — Z0000 Encounter for general adult medical examination without abnormal findings: Secondary | ICD-10-CM

## 2023-11-29 NOTE — Progress Notes (Signed)
 Because this visit was a virtual/telehealth visit,  certain criteria was not obtained, such a blood pressure, CBG if applicable, and timed get up and go. Any medications not marked as "taking" were not mentioned during the medication reconciliation part of the visit. Any vitals not documented were not able to be obtained due to this being a telehealth visit or patient was unable to self-report a recent blood pressure reading due to a lack of equipment at home via telehealth. Vitals that have been documented are verbally provided by the patient.   Subjective:   Devin Daugherty is a 72 y.o. male who presents for Medicare Annual/Subsequent preventive examination.  Visit Complete: Virtual I connected with  Devin Daugherty on 11/29/23 by a audio enabled telemedicine application and verified that I am speaking with the correct person using two identifiers.  Patient Location: Home  Provider Location: Home Office  I discussed the limitations of evaluation and management by telemedicine. The patient expressed understanding and agreed to proceed.  Vital Signs: Because this visit was a virtual/telehealth visit, some criteria may be missing or patient reported. Any vitals not documented were not able to be obtained and vitals that have been documented are patient reported.  Patient Medicare AWV questionnaire was completed by the patient on na; I have confirmed that all information answered by patient is correct and no changes since this date.  Cardiac Risk Factors include: advanced age (>53men, >81 women);dyslipidemia;hypertension;male gender;obesity (BMI >30kg/m2);sedentary lifestyle     Objective:    Today's Vitals   11/29/23 1227  Weight: 206 lb (93.4 kg)  Height: 5\' 8"  (1.727 m)  PainSc: 4    Body mass index is 31.32 kg/m.     11/29/2023   12:31 PM 02/09/2023    4:37 PM 10/25/2022   10:45 AM 04/24/2022    2:05 AM 10/20/2021   11:05 AM 08/22/2019    1:33 PM 06/12/2019   11:01 AM   Advanced Directives  Does Patient Have a Medical Advance Directive? No No No No No No No  Would patient like information on creating a medical advance directive? No - Patient declined  Yes (MAU/Ambulatory/Procedural Areas - Information given) No - Patient declined No - Patient declined  No - Patient declined    Current Medications (verified) Outpatient Encounter Medications as of 11/29/2023  Medication Sig   carbidopa-levodopa (SINEMET IR) 25-100 MG tablet Take 1 tablet by mouth 3 (three) times daily.   cyclobenzaprine (FLEXERIL) 5 MG tablet Take 1 tablet (5 mg total) by mouth 2 (two) times daily as needed for muscle spasms.   fluvoxaMINE (LUVOX) 100 MG tablet Take 100 mg by mouth at bedtime.   fluvoxaMINE (LUVOX) 50 MG tablet Take 50 mg by mouth in the morning.   hydrOXYzine (VISTARIL) 25 MG capsule Take by mouth.   isosorbide mononitrate (IMDUR) 60 MG 24 hr tablet TAKE 1 TABLET BY MOUTH DAILY   lisinopril (ZESTRIL) 40 MG tablet Take 20 mg by mouth daily.   LORazepam (ATIVAN) 1 MG tablet Take 1 tablet (1 mg total) by mouth 2 (two) times daily.   mirtazapine (REMERON SOL-TAB) 30 MG disintegrating tablet Take 15 mg by mouth at bedtime.   predniSONE (DELTASONE) 20 MG tablet Take 1 tablet (20 mg total) by mouth daily with breakfast.   QUEtiapine (SEROQUEL) 200 MG tablet Take 200 mg by mouth at bedtime.   rosuvastatin (CRESTOR) 40 MG tablet TAKE 1 TABLET BY MOUTH DAILY   sildenafil (REVATIO) 20 MG tablet Take 2-3 tablets  po 1-2 hrs before sex   zolpidem (AMBIEN) 10 MG tablet Take 1 tablet (10 mg total) by mouth at bedtime as needed for sleep.   zolpidem (AMBIEN) 10 MG tablet Take 10 mg by mouth at bedtime.   No facility-administered encounter medications on file as of 11/29/2023.    Allergies (verified) Codeine   History: Past Medical History:  Diagnosis Date   Anxiety    CAD (coronary artery disease)    Depression    High cholesterol    Hypertension    Past Surgical History:   Procedure Laterality Date   COLONOSCOPY     CORONARY ANGIOPLASTY WITH STENT PLACEMENT  05/2018   Heart  Stent  2009   HERNIA REPAIR     Family History  Problem Relation Age of Onset   Heart attack Father    Hyperlipidemia Son    Social History   Socioeconomic History   Marital status: Married    Spouse name: Not on file   Number of children: 2   Years of education: 14   Highest education level: Associate degree: occupational, Scientist, product/process development, or vocational program  Occupational History   Occupation: retired    Associate Professor: [employer]    Comment: welder  Tobacco Use   Smoking status: Never   Smokeless tobacco: Never  Vaping Use   Vaping status: Never Used  Substance and Sexual Activity   Alcohol use: No   Drug use: No   Sexual activity: Yes    Partners: Female    Birth control/protection: None    Comment: married  Other Topics Concern   Not on file  Social History Narrative   Married, lives at home with spouse. Multiple children and grandchildren. 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 Active in local church. Likes to travel with spouse to the beach.    Social Determinants of Health   Financial Resource Strain: Low Risk  (11/29/2023)   Overall Financial Resource Strain (CARDIA)    Difficulty of Paying Living Expenses: Not hard at all  Food Insecurity: No Food Insecurity (11/29/2023)   Hunger Vital Sign    Worried About Running Out of Food in the Last Year: Never true    Ran Out of Food in the Last Year: Never true  Transportation Needs: No Transportation Needs (11/29/2023)   PRAPARE - Administrator, Civil Service (Medical): No    Lack of Transportation (Non-Medical): No  Physical Activity: Inactive (11/29/2023)   Exercise Vital Sign    Days of Exercise per Week: 0 days    Minutes of Exercise per Session: 0 min  Stress: Stress Concern Present (11/29/2023)   [employer] of Occupational Health - Occupational Stress Questionnaire    Feeling of  Stress : To some extent  Social Connections: Moderately Integrated (11/29/2023)   Social Connection and Isolation Panel [NHANES]    Frequency of Communication with Friends and Family: Twice a week    Frequency of Social Gatherings with Friends and Family: Once a week    Attends Religious Services: More than 4 times per year    Active Member of [employer] or Organizations: No    Attends Engineer, structural: Never    Marital Status: Married    Tobacco Counseling Counseling given: Yes   Clinical Intake:  Pre-visit preparation completed: Yes  Pain : 0-10 Pain Score: 4  Pain Type: Chronic pain Pain Location: Leg Pain Orientation: Left Pain Descriptors / Indicators: Cramping, Spasm Pain Onset: More than  a month ago Pain Frequency: Constant     BMI - recorded: 31.32 Nutritional Status: BMI > 30  Obese Nutritional Risks: None Diabetes: No  How often do you need to have someone help you when you read instructions, pamphlets, or other written materials from your doctor or pharmacy?: 1 - Never  Interpreter Needed?: No  Information entered by ::  Jarae Nemmers, CMA   Activities of Daily Living    11/29/2023   12:30 PM  In your present state of health, do you have any difficulty performing the following activities:  Hearing? 0  Vision? 0  Difficulty concentrating or making decisions? 0  Walking or climbing stairs? 0  Dressing or bathing? 0  Doing errands, shopping? 0  Preparing Food and eating ? N  Using the Toilet? N  In the past six months, have you accidently leaked urine? N  Do you have problems with loss of bowel control? N  Managing your Medications? N  Managing your Finances? N  Housekeeping or managing your Housekeeping? N    Patient Care Team: Anabel Halon, MD as PCP - General (Internal Medicine)  Indicate any recent Medical Services you may have received from other than [facility] providers in the past year (date may be approximate).     Assessment:    This is a routine wellness examination for Devin Daugherty.  Hearing/Vision screen Hearing Screening - Comments:: Patient denies any hearing difficulties.   Vision Screening - Comments:: Patient wears reading glasses. He is not up to date with yearly exams but stated he goes to [facility] in [place] when he does   Goals Addressed             This Visit's Progress    patient stated       "I'd like to get to feeling better"       Depression Screen    11/29/2023   12:33 PM 11/16/2023    2:17 PM 04/20/2023   10:40 AM 03/29/2023    9:00 AM 03/24/2023    2:39 PM 03/01/2023   11:10 AM 06/30/2022    2:34 PM  PHQ 2/9 Scores  PHQ - 2 Score 6 0 4 4 4 6  0  PHQ- 9 Score 20  12 12 17 27  0    Fall Risk    11/29/2023   12:32 PM 11/16/2023    2:17 PM 04/20/2023   10:39 AM 03/29/2023    9:00 AM 03/24/2023    2:39 PM  Fall Risk   Falls in the past year? 0 0 0 0 1  Number falls in past yr: 0 0 0 0 1  Injury with Fall? 0 0 0 0 0  Risk for fall due to : No Fall Risks No Fall Risks     Follow up Falls prevention discussed Falls evaluation completed       MEDICARE RISK AT HOME: Medicare Risk at Home Any stairs in or around the home?: No If so, are there any without handrails?: No Home free of loose throw rugs in walkways, pet beds, electrical cords, etc?: Yes Adequate lighting in your home to reduce risk of falls?: Yes Life alert?: No Use of a cane, walker or w/c?: No Grab bars in the bathroom?: Yes Shower chair or bench in shower?: Yes Elevated toilet seat or a handicapped toilet?: No  TIMED UP AND GO:  Was the test performed?  No    Cognitive Function:    10/20/2021   11:05 AM  MMSE -  Mini Mental State Exam  Not completed: Unable to complete        11/29/2023   12:33 PM 10/25/2022   10:49 AM 10/20/2021   11:05 AM  6CIT Screen  What Year? 0 points 0 points 0 points  What month? 0 points 0 points 0 points  What time? 0 points 0 points 0 points  Count back from 20 0 points 0 points  0 points  Months in reverse 0 points 0 points 0 points  Repeat phrase 0 points 10 points 0 points  Total Score 0 points 10 points 0 points    Immunizations Immunization History  Administered Date(s) Administered   DTaP 02/13/2011   PFIZER(Purple Top)SARS-COV-2 Vaccination 01/16/2020, 02/07/2020   PNEUMOCOCCAL CONJUGATE-20 08/26/2021   Tdap 06/16/2022    TDAP status: Up to date  Flu Vaccine status: Due, Education has been provided regarding the importance of this vaccine. Advised may receive this vaccine at local pharmacy or Health Dept. Aware to provide a copy of the vaccination record if obtained from local pharmacy or Health Dept. Verbalized acceptance and understanding.  Pneumococcal vaccine status: Up to date  Covid-19 vaccine status: Information provided on how to obtain vaccines.   Qualifies for Shingles Vaccine? Yes   Zostavax completed No   Shingrix Completed?: No.    Education has been provided regarding the importance of this vaccine. Patient has been advised to call insurance company to determine out of pocket expense if they have not yet received this vaccine. Advised may also receive vaccine at local pharmacy or Health Dept. Verbalized acceptance and understanding.  Screening Tests Health Maintenance  Topic Date Due   Colonoscopy  Never done   Zoster Vaccines- Shingrix (1 of 2) Never done   COVID-19 Vaccine (3 - 2023-24 season) 08/28/2023   Medicare Annual Wellness (AWV)  10/26/2023   INFLUENZA VACCINE  03/26/2024 (Originally 07/28/2023)   DTaP/Tdap/Td (3 - Td or Tdap) 06/16/2032   Pneumonia Vaccine 62+ Years old  Completed   Hepatitis C Screening  Completed   HPV VACCINES  Aged Out    Health Maintenance  Health Maintenance Due  Topic Date Due   Colonoscopy  Never done   Zoster Vaccines- Shingrix (1 of 2) Never done   COVID-19 Vaccine (3 - 2023-24 season) 08/28/2023   Medicare Annual Wellness (AWV)  10/26/2023    Colorectal Cancer Screening: Patient  declined colorectal cancer screening   Lung Cancer Screening: (Low Dose CT Chest recommended if Age 62-80 years, 20 pack-year currently smoking OR have quit w/in 15years.) does not qualify.   Lung Cancer Screening Referral: na  Additional Screening:  Hepatitis C Screening: does not qualify; Completed   Vision Screening: Recommended annual ophthalmology exams for early detection of glaucoma and other disorders of the eye. Is the patient up to date with their annual eye exam?  No  Who is the provider or what is the name of the office in which the patient attends annual eye exams? [facility]  If pt is not established with a provider, would they like to be referred to a provider to establish care? No .   Dental Screening: Recommended annual dental exams for proper oral hygiene  Diabetic Foot Exam: n/a  Community Resource Referral / Chronic Care Management: CRR required this visit?  No   CCM required this visit?  No     Plan:     I have personally reviewed and noted the following in the patient's chart:   Medical and  social history Use of alcohol, tobacco or illicit drugs  Current medications and supplements including opioid prescriptions. Patient is not currently taking opioid prescriptions. Functional ability and status Nutritional status Physical activity Advanced directives List of other physicians Hospitalizations, surgeries, and ER visits in previous 12 months Vitals Screenings to include cognitive, depression, and falls Referrals and appointments  In addition, I have reviewed and discussed with patient certain preventive protocols, quality metrics, and best practice recommendations. A written personalized care plan for preventive services as well as general preventive health recommendations were provided to patient.     Jordan Hawks Santiago Graf, CMA   11/29/2023   After Visit Summary: (MyChart) Due to this being a telephonic visit, the after visit summary with  patients personalized plan was offered to patient via MyChart   See routing comment

## 2023-11-29 NOTE — Patient Instructions (Signed)
Mr. Devin Daugherty , Thank you for taking time to come for your Medicare Wellness Visit. I appreciate your ongoing commitment to your health goals. Please review the following plan we discussed and let me know if I can assist you in the future.   Referrals/Orders/Follow-Ups/Clinician Recommendations:  Next Medicare Annual Wellness Visit: December 03, 2024 at 10:00 am virtual visit.  As we discussed during you visit, I will send a message to Dr. Allena Katz regarding your not feeling well and your symptoms. I will also let him know that you are requesting a referral to see someone for depression.  You may have to have a face 2 face visit before this type of referral can be placed.   This is a list of the screening recommended for you and due dates:  Health Maintenance  Topic Date Due   Colon Cancer Screening  Never done   Zoster (Shingles) Vaccine (1 of 2) Never done   COVID-19 Vaccine (3 - 2023-24 season) 08/28/2023   Flu Shot  03/26/2024*   Medicare Annual Wellness Visit  11/28/2024   DTaP/Tdap/Td vaccine (3 - Td or Tdap) 06/16/2032   Pneumonia Vaccine  Completed   Hepatitis C Screening  Completed   HPV Vaccine  Aged Out  *Topic was postponed. The date shown is not the original due date.    Advanced directives: (Declined) Advance directive discussed with you today. Even though you declined this today, please call our office should you change your mind, and we can give you the proper paperwork for you to fill out.  Next Medicare Annual Wellness Visit scheduled for next year: Yes  Preventive Care 74 Years and Older, Male Preventive care refers to lifestyle choices and visits with your health care provider that can promote health and wellness. Preventive care visits are also called wellness exams. What can I expect for my preventive care visit? Counseling During your preventive care visit, your health care provider may ask about your: Medical history, including: Past medical problems. Family  medical history. History of falls. Current health, including: Emotional well-being. Home life and relationship well-being. Sexual activity. Memory and ability to understand (cognition). Lifestyle, including: Alcohol, nicotine or tobacco, and drug use. Access to firearms. Diet, exercise, and sleep habits. Work and work Astronomer. Sunscreen use. Safety issues such as seatbelt and bike helmet use. Physical exam Your health care provider will check your: Height and weight. These may be used to calculate your BMI (body mass index). BMI is a measurement that tells if you are at a healthy weight. Waist circumference. This measures the distance around your waistline. This measurement also tells if you are at a healthy weight and may help predict your risk of certain diseases, such as type 2 diabetes and high blood pressure. Heart rate and blood pressure. Body temperature. Skin for abnormal spots. What immunizations do I need?  Vaccines are usually given at various ages, according to a schedule. Your health care provider will recommend vaccines for you based on your age, medical history, and lifestyle or other factors, such as travel or where you work. What tests do I need? Screening Your health care provider may recommend screening tests for certain conditions. This may include: Lipid and cholesterol levels. Diabetes screening. This is done by checking your blood sugar (glucose) after you have not eaten for a while (fasting). Hepatitis C test. Hepatitis B test. HIV (human immunodeficiency virus) test. STI (sexually transmitted infection) testing, if you are at risk. Lung cancer screening. Colorectal cancer screening. Prostate cancer  screening. Abdominal aortic aneurysm (AAA) screening. You may need this if you are a current or former smoker. Talk with your health care provider about your test results, treatment options, and if necessary, the need for more tests. Follow these  instructions at home: Eating and drinking  Eat a diet that includes fresh fruits and vegetables, whole grains, lean protein, and low-fat dairy products. Limit your intake of foods with high amounts of sugar, saturated fats, and salt. Take vitamin and mineral supplements as recommended by your health care provider. Do not drink alcohol if your health care provider tells you not to drink. If you drink alcohol: Limit how much you have to 0-2 drinks a day. Know how much alcohol is in your drink. In the [place] one drink equals one 12 oz bottle of beer (355 mL), one 5 oz glass of wine (148 mL), or one 1 oz glass of hard liquor (44 mL). Lifestyle Brush your teeth every morning and night with fluoride toothpaste. Floss one time each day. Exercise for at least 30 minutes 5 or more days each week. Do not use any products that contain nicotine or tobacco. These products include cigarettes, chewing tobacco, and vaping devices, such as e-cigarettes. If you need help quitting, ask your health care provider. Do not use drugs. If you are sexually active, practice safe sex. Use a condom or other form of protection to prevent STIs. Take aspirin only as told by your health care provider. Make sure that you understand how much to take and what form to take. Work with your health care provider to find out whether it is safe and beneficial for you to take aspirin daily. Ask your health care provider if you need to take a cholesterol-lowering medicine (statin). Find healthy ways to manage stress, such as: Meditation, yoga, or listening to music. Journaling. Talking to a trusted person. Spending time with friends and family. Safety Always wear your seat belt while driving or riding in a vehicle. Do not drive: If you have been drinking alcohol. Do not ride with someone who has been drinking. When you are tired or distracted. While texting. If you have been using any mind-altering substances or drugs. Wear a  helmet and other protective equipment during sports activities. If you have firearms in your house, make sure you follow all gun safety procedures. Minimize exposure to UV radiation to reduce your risk of skin cancer. What's next? Visit your health care provider once a year for an annual wellness visit. Ask your health care provider how often you should have your eyes and teeth checked. Stay up to date on all vaccines. This information is not intended to replace advice given to you by your health care provider. Make sure you discuss any questions you have with your health care provider. Document Revised: 06/10/2021 Document Reviewed: 06/10/2021 Elsevier Patient Education  2024 [employer]. Understanding Your Risk for Falls Millions of people have serious injuries from falls each year. It is important to understand your risk of falling. Talk with your health care provider about your risk and what you can do to lower it. If you do have a serious fall, make sure to tell your provider. Falling once raises your risk of falling again. How can falls affect me? Serious injuries from falls are common. These include: Broken bones, such as hip fractures. Head injuries, such as traumatic brain injuries (TBI) or concussions. A fear of falling can cause you to avoid activities and stay at home. This can  make your muscles weaker and raise your risk for a fall. What can increase my risk? There are a number of risk factors that increase your risk for falling. The more risk factors you have, the higher your risk of falling. Serious injuries from a fall happen most often to people who are older than 74 years old. Teenagers and young adults ages 2-29 are also at higher risk. Common risk factors include: Weakness in the lower body. Being generally weak or confused due to long-term (chronic) illness. Dizziness or balance problems. Poor vision. Medicines that cause dizziness or drowsiness. These may  include: Medicines for your blood pressure, heart, anxiety, insomnia, or swelling (edema). Pain medicines. Muscle relaxants. Other risk factors include: Drinking alcohol. Having had a fall in the past. Having foot pain or wearing improper footwear. Working at a dangerous job. Having any of the following in your home: Tripping hazards, such as floor clutter or loose rugs. Poor lighting. Pets. Having dementia or memory loss. What actions can I take to lower my risk of falling?     Physical activity Stay physically fit. Do strength and balance exercises. Consider taking a regular class to build strength and balance. Yoga and tai chi are good options. Vision Have your eyes checked every year and your prescription for glasses or contacts updated as needed. Shoes and walking aids Wear non-skid shoes. Wear shoes that have rubber soles and low heels. Do not wear high heels. Do not walk around the house in socks or slippers. Use a cane or walker as told by your provider. Home safety Attach secure railings on both sides of your stairs. Install grab bars for your bathtub, shower, and toilet. Use a non-skid mat in your bathtub or shower. Attach bath mats securely with double-sided, non-slip rug tape. Use good lighting in all rooms. Keep a flashlight near your bed. Make sure there is a clear path from your bed to the bathroom. Use night-lights. Do not use throw rugs. Make sure all carpeting is taped or tacked down securely. Remove all clutter from walkways and stairways, including extension cords. Repair uneven or broken steps and floors. Avoid walking on icy or slippery surfaces. Walk on the grass instead of on icy or slick sidewalks. Use ice melter to get rid of ice on walkways in the winter. Use a cordless phone. Questions to ask your health care provider Can you help me check my risk for a fall? Do any of my medicines make me more likely to fall? Should I take a vitamin D  supplement? What exercises can I do to improve my strength and balance? Should I make an appointment to have my vision checked? Do I need a bone density test to check for weak bones (osteoporosis)? Would it help to use a cane or a walker? Where to find more information Centers for Disease Control and Prevention, STEAD[employer]: TonerPromos.no Community-Based Fall Prevention Programs: TonerPromos.no [employer] on Aging: BaseRingTones.pl Contact a health care provider if: You fall at home. You are afraid of falling at home. You feel weak, drowsy, or dizzy. This information is not intended to replace advice given to you by your health care provider. Make sure you discuss any questions you have with your health care provider. Document Revised: 08/16/2022 Document Reviewed: 08/16/2022 Elsevier Patient Education  2024 [employer].

## 2023-11-30 DIAGNOSIS — E871 Hypo-osmolality and hyponatremia: Secondary | ICD-10-CM | POA: Diagnosis not present

## 2023-11-30 DIAGNOSIS — E559 Vitamin D deficiency, unspecified: Secondary | ICD-10-CM | POA: Diagnosis not present

## 2023-11-30 DIAGNOSIS — N1831 Chronic kidney disease, stage 3a: Secondary | ICD-10-CM | POA: Diagnosis not present

## 2023-11-30 DIAGNOSIS — I129 Hypertensive chronic kidney disease with stage 1 through stage 4 chronic kidney disease, or unspecified chronic kidney disease: Secondary | ICD-10-CM | POA: Diagnosis not present

## 2023-12-05 DIAGNOSIS — E559 Vitamin D deficiency, unspecified: Secondary | ICD-10-CM | POA: Diagnosis not present

## 2023-12-05 DIAGNOSIS — I129 Hypertensive chronic kidney disease with stage 1 through stage 4 chronic kidney disease, or unspecified chronic kidney disease: Secondary | ICD-10-CM | POA: Diagnosis not present

## 2023-12-05 DIAGNOSIS — E871 Hypo-osmolality and hyponatremia: Secondary | ICD-10-CM | POA: Diagnosis not present

## 2023-12-05 DIAGNOSIS — N1831 Chronic kidney disease, stage 3a: Secondary | ICD-10-CM | POA: Diagnosis not present

## 2023-12-06 DIAGNOSIS — R253 Fasciculation: Secondary | ICD-10-CM | POA: Diagnosis not present

## 2023-12-07 DIAGNOSIS — F411 Generalized anxiety disorder: Secondary | ICD-10-CM | POA: Diagnosis not present

## 2023-12-07 DIAGNOSIS — F332 Major depressive disorder, recurrent severe without psychotic features: Secondary | ICD-10-CM | POA: Diagnosis not present

## 2023-12-07 DIAGNOSIS — F1021 Alcohol dependence, in remission: Secondary | ICD-10-CM | POA: Diagnosis not present

## 2023-12-07 DIAGNOSIS — F410 Panic disorder [episodic paroxysmal anxiety] without agoraphobia: Secondary | ICD-10-CM | POA: Diagnosis not present

## 2023-12-13 ENCOUNTER — Other Ambulatory Visit (HOSPITAL_COMMUNITY): Payer: Self-pay | Admitting: Nephrology

## 2023-12-13 DIAGNOSIS — N1831 Chronic kidney disease, stage 3a: Secondary | ICD-10-CM

## 2023-12-13 DIAGNOSIS — E871 Hypo-osmolality and hyponatremia: Secondary | ICD-10-CM

## 2023-12-13 DIAGNOSIS — K8689 Other specified diseases of pancreas: Secondary | ICD-10-CM

## 2023-12-13 DIAGNOSIS — E559 Vitamin D deficiency, unspecified: Secondary | ICD-10-CM

## 2023-12-16 DIAGNOSIS — N189 Chronic kidney disease, unspecified: Secondary | ICD-10-CM | POA: Diagnosis not present

## 2023-12-22 ENCOUNTER — Ambulatory Visit (HOSPITAL_COMMUNITY)
Admission: RE | Admit: 2023-12-22 | Discharge: 2023-12-22 | Disposition: A | Discharge status: Home / Self Care | Payer: Medicare Other | Source: Ambulatory Visit | Attending: Nephrology | Admitting: Nephrology

## 2023-12-22 DIAGNOSIS — E559 Vitamin D deficiency, unspecified: Secondary | ICD-10-CM | POA: Diagnosis not present

## 2023-12-22 DIAGNOSIS — E871 Hypo-osmolality and hyponatremia: Secondary | ICD-10-CM | POA: Diagnosis not present

## 2023-12-22 DIAGNOSIS — N1831 Chronic kidney disease, stage 3a: Secondary | ICD-10-CM | POA: Insufficient documentation

## 2023-12-22 DIAGNOSIS — K8689 Other specified diseases of pancreas: Secondary | ICD-10-CM | POA: Insufficient documentation

## 2023-12-22 DIAGNOSIS — N183 Chronic kidney disease, stage 3 unspecified: Secondary | ICD-10-CM | POA: Diagnosis not present

## 2024-01-23 DIAGNOSIS — R809 Proteinuria, unspecified: Secondary | ICD-10-CM | POA: Diagnosis not present

## 2024-01-23 DIAGNOSIS — N189 Chronic kidney disease, unspecified: Secondary | ICD-10-CM | POA: Diagnosis not present

## 2024-01-23 DIAGNOSIS — D631 Anemia in chronic kidney disease: Secondary | ICD-10-CM | POA: Diagnosis not present

## 2024-01-25 DIAGNOSIS — R030 Elevated blood-pressure reading, without diagnosis of hypertension: Secondary | ICD-10-CM | POA: Diagnosis not present

## 2024-01-25 DIAGNOSIS — E669 Obesity, unspecified: Secondary | ICD-10-CM | POA: Diagnosis not present

## 2024-01-25 DIAGNOSIS — Z6831 Body mass index (BMI) 31.0-31.9, adult: Secondary | ICD-10-CM | POA: Diagnosis not present

## 2024-01-25 DIAGNOSIS — K5900 Constipation, unspecified: Secondary | ICD-10-CM | POA: Diagnosis not present

## 2024-01-30 DIAGNOSIS — R809 Proteinuria, unspecified: Secondary | ICD-10-CM | POA: Diagnosis not present

## 2024-01-30 DIAGNOSIS — N1831 Chronic kidney disease, stage 3a: Secondary | ICD-10-CM | POA: Diagnosis not present

## 2024-01-30 DIAGNOSIS — E559 Vitamin D deficiency, unspecified: Secondary | ICD-10-CM | POA: Diagnosis not present

## 2024-01-30 DIAGNOSIS — I129 Hypertensive chronic kidney disease with stage 1 through stage 4 chronic kidney disease, or unspecified chronic kidney disease: Secondary | ICD-10-CM | POA: Diagnosis not present

## 2024-02-01 DIAGNOSIS — R4189 Other symptoms and signs involving cognitive functions and awareness: Secondary | ICD-10-CM | POA: Diagnosis not present

## 2024-02-01 DIAGNOSIS — F410 Panic disorder [episodic paroxysmal anxiety] without agoraphobia: Secondary | ICD-10-CM | POA: Diagnosis not present

## 2024-02-01 DIAGNOSIS — F5105 Insomnia due to other mental disorder: Secondary | ICD-10-CM | POA: Diagnosis not present

## 2024-02-01 DIAGNOSIS — F1320 Sedative, hypnotic or anxiolytic dependence, uncomplicated: Secondary | ICD-10-CM | POA: Diagnosis not present

## 2024-02-01 DIAGNOSIS — F3341 Major depressive disorder, recurrent, in partial remission: Secondary | ICD-10-CM | POA: Diagnosis not present

## 2024-02-01 DIAGNOSIS — F411 Generalized anxiety disorder: Secondary | ICD-10-CM | POA: Diagnosis not present

## 2024-02-13 DIAGNOSIS — N1831 Chronic kidney disease, stage 3a: Secondary | ICD-10-CM | POA: Diagnosis not present

## 2024-02-14 ENCOUNTER — Encounter: Payer: Self-pay | Admitting: Internal Medicine

## 2024-02-14 ENCOUNTER — Ambulatory Visit (INDEPENDENT_AMBULATORY_CARE_PROVIDER_SITE_OTHER): Payer: Medicare Other | Admitting: Internal Medicine

## 2024-02-14 VITALS — BP 130/78 | HR 60 | Ht 68.0 in | Wt 200.0 lb

## 2024-02-14 DIAGNOSIS — K5904 Chronic idiopathic constipation: Secondary | ICD-10-CM

## 2024-02-14 DIAGNOSIS — N1831 Chronic kidney disease, stage 3a: Secondary | ICD-10-CM

## 2024-02-14 DIAGNOSIS — G2111 Neuroleptic induced parkinsonism: Secondary | ICD-10-CM | POA: Diagnosis not present

## 2024-02-14 DIAGNOSIS — I10 Essential (primary) hypertension: Secondary | ICD-10-CM | POA: Diagnosis not present

## 2024-02-14 DIAGNOSIS — T43505A Adverse effect of unspecified antipsychotics and neuroleptics, initial encounter: Secondary | ICD-10-CM

## 2024-02-14 MED ORDER — LUBIPROSTONE 24 MCG PO CAPS: 24.0000 ug | ORAL_CAPSULE | Freq: Every day | ORAL | 3 refills | Status: DC

## 2024-02-14 NOTE — Assessment & Plan Note (Addendum)
Likely has IBS-C considering his history of uncontrolled MDD and GAD He was worried about SBO, but does not have nausea/vomiting; his bowel sounds are normal and abdomen is soft, nontender Has abdominal discomfort, and is afraid to eat due to severe constipation Has tried Colace, Senokot and MiraLAX without relief Started Amitiza due to persistent constipation despite trial of OTC stool softeners and laxatives Maintain adequate hydration

## 2024-02-14 NOTE — Progress Notes (Signed)
Established Patient Office Visit  Subjective:  Patient ID: Devin Daugherty, male    DOB: July 23, 1951  Age: 73 y.o. MRN: [id number]  CC:  Chief Complaint  Patient presents with   Constipation    Pt reports sx of constipation for 1 month.    Chronic Kidney Disease    HPI Devin Daugherty is a 73 y.o. male with past medical history of HTN, CAD s/p stent placement, HLD, depression with anxiety, CKD stage 3a, and medication induced parkinsonism who presents for f/u of his chronic medical conditions and constipation.  Constipation: He reports chronic constipation, his usual frequency is once or twice in a week.  He has been feeling worsening of constipation for the last 4 weeks.  He went to urgent care in [place], had x-ray of abdomen done, which showed large colonic burden according to the patient.  He denies any nausea or vomiting currently.  Denies any melena or hematochezia.  He has tried Colace, Senokot and MiraLAX without much relief.  He has tried taking magnesium citrate, but had diarrhea with it.  HTN: BP is well-controlled. Takes medications regularly. Patient denies headache, chest pain, dyspnea or palpitations.  CAD: S/p stent placement, on Aspirin and statin, followed by Cardiology.  Denies any chest pain currently.   Depression with anxiety: Followed by Psychiatry. He is on multiple medications. Denies any SI or HI.  He has had hospitalization for suicidal ideation/intentional drug overdose of carvedilol and lisinopril in 02/18.  He was admitted for inpatient psychiatry evaluation and has seen psychiatrist at [facility].  He is placed on Seroquel, Luvox and Remeron.  He also takes Xanax 1 mg twice daily, but he still has severe anxiety.  He still complains of apathy, severe anxiety, insomnia and decreased concentration.  He reports bilateral foot burning sensation and intermittent tingling as well.  He had NCS and EMG, which were unremarkable.  Denies any recent injury.  He also  reports bilateral leg cramps, which are chronic and intermittent.  Of note, he has history of alcohol abuse and hyponatremia. He has been prescribed tizanidine by his neurologist, but he has not been taking it.  He takes Sinemet for drug induced parkinsonism.  Past Medical History:  Diagnosis Date   Anxiety    CAD (coronary artery disease)    Depression    High cholesterol    Hypertension     Past Surgical History:  Procedure Laterality Date   COLONOSCOPY     CORONARY ANGIOPLASTY WITH STENT PLACEMENT  05/2018   Heart  Stent  2009   HERNIA REPAIR      Family History  Problem Relation Age of Onset   Heart attack Father    Hyperlipidemia Son     Social History   Socioeconomic History   Marital status: Married    Spouse name: Not on file   Number of children: 2   Years of education: 14   Highest education level: Associate degree: occupational, Scientist, product/process development, or vocational program  Occupational History   Occupation: retired    Associate Professor: [employer]    Comment: welder  Tobacco Use   Smoking status: Never   Smokeless tobacco: Never  Vaping Use   Vaping status: Never Used  Substance and Sexual Activity   Alcohol use: No   Drug use: No   Sexual activity: Yes    Partners: Female    Birth control/protection: None    Comment: married  Other Topics Concern   Not on file  Social History Narrative   Married, lives at home with spouse. Multiple children and grandchildren. 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 Active in local church. Likes to travel with spouse to the beach.    Social Drivers of Corporate investment banker Strain: Low Risk  (02/01/2024)   Received from [facility]   Overall Financial Resource Strain (CARDIA)    Difficulty of Paying Living Expenses: Not hard at all  Food Insecurity: No Food Insecurity (02/01/2024)   Received from [facility]   Hunger Vital Sign    Worried About Running Out of Food in the Last Year: Never true    Ran Out of Food  in the Last Year: Never true  Transportation Needs: No Transportation Needs (02/01/2024)   Received from [facility] - Transportation    Lack of Transportation (Medical): No    Lack of Transportation (Non-Medical): No  Physical Activity: Inactive (11/29/2023)   Exercise Vital Sign    Days of Exercise per Week: 0 days    Minutes of Exercise per Session: 0 min  Stress: Stress Concern Present (11/29/2023)   [employer] of Occupational Health - Occupational Stress Questionnaire    Feeling of Stress : To some extent  Social Connections: Moderately Integrated (11/29/2023)   Social Connection and Isolation Panel [NHANES]    Frequency of Communication with Friends and Family: Twice a week    Frequency of Social Gatherings with Friends and Family: Once a week    Attends Religious Services: More than 4 times per year    Active Member of [employer] or Organizations: No    Attends Banker Meetings: Never    Marital Status: Married  Catering manager Violence: Not At Risk (11/29/2023)   Humiliation, Afraid, Rape, and Kick questionnaire    Fear of Current or Ex-Partner: No    Emotionally Abused: No    Physically Abused: No    Sexually Abused: No    Outpatient Medications Prior to Visit  Medication Sig Dispense Refill   ALPRAZolam (XANAX) 1 MG tablet Take 1 mg by mouth daily.     carbidopa-levodopa (SINEMET IR) 25-100 MG tablet Take 1 tablet by mouth 3 (three) times daily.     cyclobenzaprine (FLEXERIL) 5 MG tablet Take 1 tablet (5 mg total) by mouth 2 (two) times daily as needed for muscle spasms. 30 tablet 1   fluvoxaMINE (LUVOX) 100 MG tablet Take 100 mg by mouth at bedtime.     fluvoxaMINE (LUVOX) 50 MG tablet Take 50 mg by mouth in the morning.     hydrOXYzine (VISTARIL) 25 MG capsule Take by mouth.     isosorbide mononitrate (IMDUR) 60 MG 24 hr tablet TAKE 1 TABLET BY MOUTH DAILY 90 tablet 3   lisinopril (ZESTRIL) 40 MG tablet Take 20 mg by mouth daily.      mirtazapine (REMERON SOL-TAB) 30 MG disintegrating tablet Take 15 mg by mouth at bedtime.     QUEtiapine (SEROQUEL) 200 MG tablet Take 200 mg by mouth at bedtime.     rosuvastatin (CRESTOR) 40 MG tablet TAKE 1 TABLET BY MOUTH DAILY 90 tablet 3   sildenafil (REVATIO) 20 MG tablet Take 2-3 tablets po 1-2 hrs before sex 24 tablet 5   LORazepam (ATIVAN) 1 MG tablet Take 1 tablet (1 mg total) by mouth 2 (two) times daily. 60 tablet 2   predniSONE (DELTASONE) 20 MG tablet Take 1 tablet (20 mg total) by mouth daily with breakfast. 10  tablet 0   zolpidem (AMBIEN) 10 MG tablet Take 10 mg by mouth at bedtime.     zolpidem (AMBIEN) 10 MG tablet Take 1 tablet (10 mg total) by mouth at bedtime as needed for sleep. 30 tablet 0   No facility-administered medications prior to visit.    Allergies  Allergen Reactions   Codeine Itching    ROS Review of Systems  Constitutional:  Positive for fatigue. Negative for chills, fever and unexpected weight change.  HENT:  Negative for sinus pressure and sinus pain.   Eyes:  Negative for pain and discharge.  Respiratory:  Negative for cough and shortness of breath.   Cardiovascular:  Negative for chest pain and palpitations.  Gastrointestinal:  Positive for constipation. Negative for diarrhea, nausea and vomiting.  Genitourinary:  Negative for dysuria and hematuria.  Musculoskeletal:  Positive for back pain. Negative for neck pain and neck stiffness.  Skin:  Negative for rash.  Neurological:  Positive for tremors and numbness (B/l feet). Negative for dizziness and seizures.  Psychiatric/Behavioral:  Positive for decreased concentration, dysphoric mood and sleep disturbance. Negative for agitation and behavioral problems. The patient is nervous/anxious.       Objective:    Physical Exam Vitals reviewed.  Constitutional:      General: He is not in acute distress.    Appearance: He is not diaphoretic.  HENT:     Head: Normocephalic and atraumatic.      Nose: Nose normal.     Mouth/Throat:     Mouth: Mucous membranes are moist.  Eyes:     General: No scleral icterus.    Extraocular Movements: Extraocular movements intact.  Cardiovascular:     Rate and Rhythm: Normal rate and regular rhythm.     Pulses: Normal pulses.     Heart sounds: Normal heart sounds. No murmur heard. Pulmonary:     Breath sounds: Normal breath sounds. No wheezing or rales.  Abdominal:     General: Bowel sounds are normal.     Palpations: Abdomen is soft.     Tenderness: There is no abdominal tenderness.  Musculoskeletal:     Cervical back: Neck supple. No tenderness.     Thoracic back: Tenderness present.     Lumbar back: Tenderness present. Decreased range of motion.     Right lower leg: No edema.     Left lower leg: No edema.  Feet:     Left foot:     Skin integrity: Callus (Between second and third toe) present.  Skin:    General: Skin is warm.     Findings: No rash.  Neurological:     General: No focal deficit present.     Mental Status: He is alert and oriented to person, place, and time.     Motor: Tremor (b/l UE) present.  Psychiatric:        Mood and Affect: Mood is anxious.        Behavior: Behavior normal.     BP 130/78   Pulse 60   Ht 5\' 8"  (1.727 m)   Wt 200 lb (90.7 kg)   SpO2 97%   BMI 30.41 kg/m  Wt Readings from Last 3 Encounters:  02/14/24 200 lb (90.7 kg)  11/29/23 206 lb (93.4 kg)  11/16/23 208 lb 3.2 oz (94.4 kg)    Lab Results  Component Value Date   TSH 1.370 03/24/2023   Lab Results  Component Value Date   WBC 8.1 03/24/2023   HGB 14.4 03/24/2023  HCT 41.8 03/24/2023   MCV 85 03/24/2023   PLT 304 03/24/2023   Lab Results  Component Value Date   NA 128 (L) 03/24/2023   K 5.0 03/24/2023   CO2 21 03/24/2023   GLUCOSE 102 (H) 03/24/2023   BUN 9 03/24/2023   CREATININE 1.21 03/24/2023   BILITOT 0.5 03/24/2023   ALKPHOS 104 03/24/2023   AST 20 03/24/2023   ALT 20 03/24/2023   PROT 6.9 03/24/2023    ALBUMIN 4.1 03/24/2023   CALCIUM 9.4 03/24/2023   ANIONGAP 11 02/09/2023   EGFR 63 03/24/2023   Lab Results  Component Value Date   CHOL 103 03/24/2023   Lab Results  Component Value Date   HDL 35 (L) 03/24/2023   Lab Results  Component Value Date   LDLCALC 46 03/24/2023   Lab Results  Component Value Date   TRIG 122 03/24/2023   Lab Results  Component Value Date   CHOLHDL 2.9 03/24/2023   Lab Results  Component Value Date   HGBA1C 6.1 (H) 11/16/2023      Assessment & Plan:   Problem List Items Addressed This Visit       Cardiovascular and Mediastinum   Hypertension (Chronic)   BP Readings from Last 1 Encounters:  02/14/24 130/78   Well-controlled with Lisinopril 20 mg QD and Imdur 60 mg QD Followed by cardiology Counseled for compliance with the medications Advised DASH diet and moderate exercise/walking, at least 150 mins/week        Digestive   Chronic idiopathic constipation - Primary   Likely has IBS-C considering his history of uncontrolled MDD and GAD He was worried about SBO, but does not have nausea/vomiting; his bowel sounds are normal and abdomen is soft, nontender Has abdominal discomfort, and is afraid to eat due to severe constipation Has tried Colace, Senokot and MiraLAX without relief Started Amitiza due to persistent constipation despite trial of OTC stool softeners and laxatives Maintain adequate hydration       Relevant Medications   lubiprostone (AMITIZA) 24 MCG capsule     Nervous and Auditory   Neuroleptic-induced parkinsonism (HCC)   Tremors likely due to antipsychotic On Sinemet currently He is placed on Seroquel, followed by psychiatry and neurology        Genitourinary   Stage 3 chronic kidney disease (HCC)   Stable stage 3a CKD, reviewed last BMP Avoid nephrotoxic agents Needs to improve fluid intake, at least 64 ounces per day On Lisinopril        Meds ordered this encounter  Medications   lubiprostone  (AMITIZA) 24 MCG capsule    Sig: Take 1 capsule (24 mcg total) by mouth daily with breakfast.    Dispense:  30 capsule    Refill:  3    Follow-up: Return if symptoms worsen or fail to improve.    Anabel Halon, MD

## 2024-02-14 NOTE — Assessment & Plan Note (Signed)
Stable stage 3a CKD, reviewed last BMP Avoid nephrotoxic agents Needs to improve fluid intake, at least 64 ounces per day On Lisinopril 

## 2024-02-14 NOTE — Assessment & Plan Note (Signed)
BP Readings from Last 1 Encounters:  02/14/24 130/78   Well-controlled with Lisinopril 20 mg QD and Imdur 60 mg QD Followed by cardiology Counseled for compliance with the medications Advised DASH diet and moderate exercise/walking, at least 150 mins/week

## 2024-02-14 NOTE — Assessment & Plan Note (Addendum)
Tremors likely due to antipsychotic On Sinemet currently He is placed on Seroquel, followed by psychiatry and neurology

## 2024-02-14 NOTE — Patient Instructions (Signed)
Please take Senokot-S or generic equivalent of it twice daily.  Please start taking Amitiza as prescribed for constipation.  You are being referred to GI for constipation.

## 2024-02-16 DIAGNOSIS — I129 Hypertensive chronic kidney disease with stage 1 through stage 4 chronic kidney disease, or unspecified chronic kidney disease: Secondary | ICD-10-CM | POA: Diagnosis not present

## 2024-02-16 DIAGNOSIS — R809 Proteinuria, unspecified: Secondary | ICD-10-CM | POA: Diagnosis not present

## 2024-02-16 DIAGNOSIS — E559 Vitamin D deficiency, unspecified: Secondary | ICD-10-CM | POA: Diagnosis not present

## 2024-02-16 DIAGNOSIS — N1832 Chronic kidney disease, stage 3b: Secondary | ICD-10-CM | POA: Diagnosis not present

## 2024-02-22 ENCOUNTER — Telehealth: Payer: Self-pay | Admitting: Internal Medicine

## 2024-02-22 ENCOUNTER — Other Ambulatory Visit: Payer: Self-pay | Admitting: Internal Medicine

## 2024-02-22 DIAGNOSIS — K582 Mixed irritable bowel syndrome: Secondary | ICD-10-CM | POA: Insufficient documentation

## 2024-02-22 NOTE — Telephone Encounter (Signed)
 lmtrc

## 2024-02-22 NOTE — Telephone Encounter (Signed)
 Patient still experiencing constipation. Last visit 2/18 has seen UC in mean time Nothing has helped, wants a call back

## 2024-02-22 NOTE — Telephone Encounter (Signed)
 Copied from CRM [id number]. Topic: Clinical - Prescription Issue >> Feb 22, 2024 12:03 PM Devin Daugherty wrote: Reason for CRM: The patient shares that they have experienced concerns related to their lubiprostone (AMITIZA) 24 MCG capsule [[id number]] prescription   The patient is concerned that the medication may be causing issues with their bowel movements

## 2024-02-23 NOTE — Telephone Encounter (Signed)
 Spoke to pt

## 2024-02-23 NOTE — Telephone Encounter (Signed)
 Pt informed

## 2024-02-23 NOTE — Telephone Encounter (Signed)
 Explained to pt instructions from Dr. Allena Katz for medication. Also let him know a referral to GI has been placed he will receive a call soon

## 2024-02-23 NOTE — Telephone Encounter (Signed)
 Copied from CRM [id number]. Topic: Clinical - Prescription Issue >> Feb 22, 2024  5:15 PM Devin Daugherty wrote: Reason for CRM: PT is wanting to speak more about his lubiprostone (AMITZIA) 24 MCG capsule prescription with a nurse from the office. Advised the update left by his PCP, he does have additional questions regarding this medication still. Callback [phone]

## 2024-02-23 NOTE — Telephone Encounter (Signed)
 Pt called back for instructions on taking, lubiprostone (AMITIZA) 24 MCG capsule . Please cb

## 2024-02-26 DIAGNOSIS — N289 Disorder of kidney and ureter, unspecified: Secondary | ICD-10-CM | POA: Diagnosis not present

## 2024-02-26 DIAGNOSIS — I10 Essential (primary) hypertension: Secondary | ICD-10-CM | POA: Diagnosis not present

## 2024-02-26 DIAGNOSIS — Z7982 Long term (current) use of aspirin: Secondary | ICD-10-CM | POA: Diagnosis not present

## 2024-02-26 DIAGNOSIS — K5900 Constipation, unspecified: Secondary | ICD-10-CM | POA: Diagnosis not present

## 2024-02-26 DIAGNOSIS — K219 Gastro-esophageal reflux disease without esophagitis: Secondary | ICD-10-CM | POA: Diagnosis not present

## 2024-02-26 DIAGNOSIS — R197 Diarrhea, unspecified: Secondary | ICD-10-CM | POA: Diagnosis not present

## 2024-02-26 DIAGNOSIS — K6389 Other specified diseases of intestine: Secondary | ICD-10-CM | POA: Diagnosis not present

## 2024-02-26 DIAGNOSIS — I2510 Atherosclerotic heart disease of native coronary artery without angina pectoris: Secondary | ICD-10-CM | POA: Diagnosis not present

## 2024-02-26 DIAGNOSIS — R110 Nausea: Secondary | ICD-10-CM | POA: Diagnosis not present

## 2024-02-26 DIAGNOSIS — K862 Cyst of pancreas: Secondary | ICD-10-CM | POA: Diagnosis not present

## 2024-02-26 DIAGNOSIS — Z87891 Personal history of nicotine dependence: Secondary | ICD-10-CM | POA: Diagnosis not present

## 2024-02-28 DIAGNOSIS — R194 Change in bowel habit: Secondary | ICD-10-CM | POA: Diagnosis not present

## 2024-02-29 DIAGNOSIS — K635 Polyp of colon: Secondary | ICD-10-CM | POA: Diagnosis not present

## 2024-02-29 DIAGNOSIS — R194 Change in bowel habit: Secondary | ICD-10-CM | POA: Diagnosis not present

## 2024-02-29 DIAGNOSIS — D123 Benign neoplasm of transverse colon: Secondary | ICD-10-CM | POA: Diagnosis not present

## 2024-03-19 DIAGNOSIS — K5900 Constipation, unspecified: Secondary | ICD-10-CM | POA: Diagnosis not present

## 2024-03-19 DIAGNOSIS — R634 Abnormal weight loss: Secondary | ICD-10-CM | POA: Diagnosis not present

## 2024-03-19 DIAGNOSIS — K862 Cyst of pancreas: Secondary | ICD-10-CM | POA: Diagnosis not present

## 2024-03-20 DIAGNOSIS — R252 Cramp and spasm: Secondary | ICD-10-CM | POA: Diagnosis not present

## 2024-03-29 DIAGNOSIS — F410 Panic disorder [episodic paroxysmal anxiety] without agoraphobia: Secondary | ICD-10-CM | POA: Diagnosis not present

## 2024-03-29 DIAGNOSIS — F3342 Major depressive disorder, recurrent, in full remission: Secondary | ICD-10-CM | POA: Diagnosis not present

## 2024-03-29 DIAGNOSIS — F5105 Insomnia due to other mental disorder: Secondary | ICD-10-CM | POA: Diagnosis not present

## 2024-03-29 DIAGNOSIS — R4189 Other symptoms and signs involving cognitive functions and awareness: Secondary | ICD-10-CM | POA: Diagnosis not present

## 2024-03-29 DIAGNOSIS — F411 Generalized anxiety disorder: Secondary | ICD-10-CM | POA: Diagnosis not present

## 2024-04-10 DIAGNOSIS — I10 Essential (primary) hypertension: Secondary | ICD-10-CM | POA: Diagnosis not present

## 2024-04-10 DIAGNOSIS — Z955 Presence of coronary angioplasty implant and graft: Secondary | ICD-10-CM | POA: Diagnosis not present

## 2024-04-10 DIAGNOSIS — I2510 Atherosclerotic heart disease of native coronary artery without angina pectoris: Secondary | ICD-10-CM | POA: Diagnosis not present

## 2024-04-10 DIAGNOSIS — K862 Cyst of pancreas: Secondary | ICD-10-CM | POA: Diagnosis not present

## 2024-04-10 DIAGNOSIS — Z7982 Long term (current) use of aspirin: Secondary | ICD-10-CM | POA: Diagnosis not present

## 2024-04-10 DIAGNOSIS — K449 Diaphragmatic hernia without obstruction or gangrene: Secondary | ICD-10-CM | POA: Diagnosis not present

## 2024-04-10 DIAGNOSIS — K219 Gastro-esophageal reflux disease without esophagitis: Secondary | ICD-10-CM | POA: Diagnosis not present

## 2024-04-10 DIAGNOSIS — Z79899 Other long term (current) drug therapy: Secondary | ICD-10-CM | POA: Diagnosis not present

## 2024-04-10 DIAGNOSIS — K869 Disease of pancreas, unspecified: Secondary | ICD-10-CM | POA: Diagnosis not present

## 2024-04-12 DIAGNOSIS — K862 Cyst of pancreas: Secondary | ICD-10-CM | POA: Diagnosis not present

## 2024-04-12 DIAGNOSIS — I10 Essential (primary) hypertension: Secondary | ICD-10-CM | POA: Diagnosis not present

## 2024-04-12 DIAGNOSIS — K5909 Other constipation: Secondary | ICD-10-CM | POA: Diagnosis not present

## 2024-04-16 ENCOUNTER — Ambulatory Visit: Payer: Medicare Other | Admitting: Internal Medicine

## 2024-04-17 DIAGNOSIS — G20A1 Parkinson's disease without dyskinesia, without mention of fluctuations: Secondary | ICD-10-CM | POA: Diagnosis not present

## 2024-04-17 DIAGNOSIS — Z79899 Other long term (current) drug therapy: Secondary | ICD-10-CM | POA: Diagnosis not present

## 2024-04-17 DIAGNOSIS — M79606 Pain in leg, unspecified: Secondary | ICD-10-CM | POA: Diagnosis not present

## 2024-04-17 DIAGNOSIS — M79605 Pain in left leg: Secondary | ICD-10-CM | POA: Diagnosis not present

## 2024-04-17 DIAGNOSIS — R252 Cramp and spasm: Secondary | ICD-10-CM | POA: Diagnosis not present

## 2024-04-17 DIAGNOSIS — I10 Essential (primary) hypertension: Secondary | ICD-10-CM | POA: Diagnosis not present

## 2024-04-17 DIAGNOSIS — Z7982 Long term (current) use of aspirin: Secondary | ICD-10-CM | POA: Diagnosis not present

## 2024-04-17 DIAGNOSIS — M79604 Pain in right leg: Secondary | ICD-10-CM | POA: Diagnosis not present

## 2024-04-17 DIAGNOSIS — R200 Anesthesia of skin: Secondary | ICD-10-CM | POA: Diagnosis not present

## 2024-04-17 DIAGNOSIS — M791 Myalgia, unspecified site: Secondary | ICD-10-CM | POA: Diagnosis not present

## 2024-04-17 DIAGNOSIS — Z955 Presence of coronary angioplasty implant and graft: Secondary | ICD-10-CM | POA: Diagnosis not present

## 2024-04-17 DIAGNOSIS — I2510 Atherosclerotic heart disease of native coronary artery without angina pectoris: Secondary | ICD-10-CM | POA: Diagnosis not present

## 2024-04-18 ENCOUNTER — Telehealth: Payer: Self-pay

## 2024-04-18 NOTE — Transitions of Care (Post Inpatient/ED Visit) (Signed)
   04/18/2024  Name: Devin Daugherty MRN: [id number] DOB: 27-Nov-1951  Today's TOC FU Call Status: Today's TOC FU Call Status:: Unsuccessful Call (1st Attempt) Unsuccessful Call (1st Attempt) Date: 04/18/24  Attempted to reach the patient regarding the most recent Inpatient/ED visit.  Follow Up Plan: Additional outreach attempts will be made to reach the patient to complete the Transitions of Care (Post Inpatient/ED visit) call.   Signature  Se[place]ron [place], LPN [facility] Health Advisor [place] [facility] You Are. We Are. [facility] Direct Dial [phone]

## 2024-04-30 ENCOUNTER — Ambulatory Visit (INDEPENDENT_AMBULATORY_CARE_PROVIDER_SITE_OTHER)

## 2024-04-30 ENCOUNTER — Encounter: Payer: Self-pay | Admitting: Podiatry

## 2024-04-30 ENCOUNTER — Ambulatory Visit: Payer: Self-pay | Admitting: Podiatry

## 2024-04-30 VITALS — Ht 68.0 in | Wt 200.0 lb

## 2024-04-30 DIAGNOSIS — M779 Enthesopathy, unspecified: Secondary | ICD-10-CM

## 2024-04-30 DIAGNOSIS — G63 Polyneuropathy in diseases classified elsewhere: Secondary | ICD-10-CM | POA: Diagnosis not present

## 2024-04-30 DIAGNOSIS — E538 Deficiency of other specified B group vitamins: Secondary | ICD-10-CM

## 2024-04-30 NOTE — Patient Instructions (Signed)
  VISIT SUMMARY: You visited today due to ongoing bilateral foot pain and a burning sensation, which you described as feeling like 'needles and burning.' You have a history of prediabetes and are currently taking gabapentin  for anxiety, but it has not been effective for your foot pain. We discussed potential causes and treatment options for your symptoms.  YOUR PLAN: -PERIPHERAL NEUROPATHY: Peripheral neuropathy is a condition where the nerves outside of your brain and spinal cord are damaged, causing pain, burning, and needle-like sensations. Your current gabapentin  dose is not effective for this pain. We discussed increasing your gabapentin  dose to 900 mg three times a day, but be aware of potential side effects like balance issues, dizziness, and sleepiness. We also talked about alternative treatments such as Lyrica, spinal cord stimulators, capsaicin cream, and lidocaine ointment. It's important to check your B12 and folate levels, and you should start taking B12 supplements regularly. Moisturizing your feet with lotion can help manage skin changes.  INSTRUCTIONS: Please discuss with your primary care physician about checking your B12 and folate levels. You should also talk to your primary care physician or neurologist about the possibility of increasing your gabapentin  dose or switching to Lyrica (pregabalin). Consider a referral to a spine specialist for an evaluation of a spinal cord stimulator. In the meantime, you can use capsaicin cream or lidocaine ointment for symptomatic relief and moisturize your feet regularly with lotion.                      Contains text generated by [employer].                                 Contains text generated by [employer].

## 2024-05-01 DIAGNOSIS — G20A1 Parkinson's disease without dyskinesia, without mention of fluctuations: Secondary | ICD-10-CM | POA: Diagnosis not present

## 2024-05-01 DIAGNOSIS — R6521 Severe sepsis with septic shock: Secondary | ICD-10-CM | POA: Diagnosis not present

## 2024-05-01 DIAGNOSIS — Z20822 Contact with and (suspected) exposure to covid-19: Secondary | ICD-10-CM | POA: Diagnosis not present

## 2024-05-01 DIAGNOSIS — A419 Sepsis, unspecified organism: Secondary | ICD-10-CM | POA: Diagnosis not present

## 2024-05-01 DIAGNOSIS — N179 Acute kidney failure, unspecified: Secondary | ICD-10-CM | POA: Diagnosis not present

## 2024-05-01 DIAGNOSIS — E785 Hyperlipidemia, unspecified: Secondary | ICD-10-CM | POA: Diagnosis not present

## 2024-05-01 DIAGNOSIS — S0990XA Unspecified injury of head, initial encounter: Secondary | ICD-10-CM | POA: Diagnosis not present

## 2024-05-01 DIAGNOSIS — J9811 Atelectasis: Secondary | ICD-10-CM | POA: Diagnosis not present

## 2024-05-01 DIAGNOSIS — R109 Unspecified abdominal pain: Secondary | ICD-10-CM | POA: Diagnosis not present

## 2024-05-01 DIAGNOSIS — R571 Hypovolemic shock: Secondary | ICD-10-CM | POA: Diagnosis not present

## 2024-05-01 DIAGNOSIS — Z885 Allergy status to narcotic agent status: Secondary | ICD-10-CM | POA: Diagnosis not present

## 2024-05-01 DIAGNOSIS — K521 Toxic gastroenteritis and colitis: Secondary | ICD-10-CM | POA: Diagnosis not present

## 2024-05-01 DIAGNOSIS — S199XXA Unspecified injury of neck, initial encounter: Secondary | ICD-10-CM | POA: Diagnosis not present

## 2024-05-01 DIAGNOSIS — F419 Anxiety disorder, unspecified: Secondary | ICD-10-CM | POA: Diagnosis not present

## 2024-05-01 DIAGNOSIS — T478X5A Adverse effect of other agents primarily affecting gastrointestinal system, initial encounter: Secondary | ICD-10-CM | POA: Diagnosis not present

## 2024-05-01 DIAGNOSIS — R55 Syncope and collapse: Secondary | ICD-10-CM | POA: Diagnosis not present

## 2024-05-01 DIAGNOSIS — Z1152 Encounter for screening for COVID-19: Secondary | ICD-10-CM | POA: Diagnosis not present

## 2024-05-01 DIAGNOSIS — I10 Essential (primary) hypertension: Secondary | ICD-10-CM | POA: Diagnosis not present

## 2024-05-01 DIAGNOSIS — R410 Disorientation, unspecified: Secondary | ICD-10-CM | POA: Diagnosis not present

## 2024-05-01 DIAGNOSIS — R079 Chest pain, unspecified: Secondary | ICD-10-CM | POA: Diagnosis not present

## 2024-05-01 DIAGNOSIS — Z79899 Other long term (current) drug therapy: Secondary | ICD-10-CM | POA: Diagnosis not present

## 2024-05-01 DIAGNOSIS — Z7982 Long term (current) use of aspirin: Secondary | ICD-10-CM | POA: Diagnosis not present

## 2024-05-01 DIAGNOSIS — R404 Transient alteration of awareness: Secondary | ICD-10-CM | POA: Diagnosis not present

## 2024-05-01 DIAGNOSIS — Z7901 Long term (current) use of anticoagulants: Secondary | ICD-10-CM | POA: Diagnosis not present

## 2024-05-01 DIAGNOSIS — R197 Diarrhea, unspecified: Secondary | ICD-10-CM | POA: Diagnosis not present

## 2024-05-01 DIAGNOSIS — R4182 Altered mental status, unspecified: Secondary | ICD-10-CM | POA: Diagnosis not present

## 2024-05-01 DIAGNOSIS — I959 Hypotension, unspecified: Secondary | ICD-10-CM | POA: Diagnosis not present

## 2024-05-01 NOTE — Progress Notes (Signed)
  Subjective:  Patient ID: Devin Daugherty, male    DOB: [birth year],  MRN: [id number]  Chief Complaint  Patient presents with   Foot Pain    Pt is here for bilateral foot pain states the pain has been there for a long time, has a burning sensation to both feet, and that it feels like he is walking on hot coal.    Discussed the use of AI scribe software for clinical note transcription with the patient, who gave verbal consent to proceed.  History of Present Illness Devin Daugherty is a 73 year old male with prediabetes who presents with bilateral foot pain and burning sensation.  He experiences a sensation in his feet described as 'needles and he burns,' which has been ongoing for a long time. No low back issues, sciatica, or low back pain. He has not been diagnosed with neuropathy previously, but he has seen a neurologist for Parkinson's disease.  He is currently taking gabapentin  300 mg three times a day, prescribed by his urologist for anxiety, but it does not alleviate the pain. He has been on gabapentin  for years. He is not taking any B12 supplements regularly, although he has been on B12 in the past.  He denies any significant changes in his feet, but notes that his toes are dark on the top, which may be related to changes in neuropathy affecting sweat gland function. He has not had any recent B12 or folate level checks.      Objective:    Physical Exam VASCULAR: DP and PT pulse palpable. Foot is warm and well-perfused. Capillary fill time is brisk. DERMATOLOGIC: Normal skin turgor, texture, and temperature. No open lesions, rashes, or ulcerations. NEUROLOGIC: Diffuse neuropathy with paresthesias and decreased sensation. ORTHOPEDIC: Smooth pain-free range of motion of all midfoot, forefoot, and hindfoot joints. Negative Tinel's sign bilaterally. No ecchymosis or bruising. No gross deformity. No pain to palpation.   No images are attached to the encounter.     Results RADIOLOGY Bilateral foot x-ray: Adequate alignment, no significant arthritic changes (04/30/2024)   Assessment:   1. Capsulitis   2. B12 neuropathy (HCC)      Plan:  Patient was evaluated and treated and all questions answered.  Assessment and Plan Assessment & Plan Peripheral neuropathy Chronic bilateral foot pain with burning and needle-like sensations, consistent with peripheral neuropathy. Prediabetic status without diabetes. Gabapentin  300 mg TID prescribed for anxiety, ineffective for neuropathy. Differential diagnosis includes vitamin B12 deficiency, diabetes, and alcohol use. X-rays show no significant arthritic changes. Neuropathy likely affecting sweat gland function, causing skin changes on toes. Gabapentin  can be increased to 900 mg TID; side effects include balance issues, dizziness, and sleepiness. Alternative treatments: Lyrica, spinal cord stimulator, capsaicin cream, lidocaine ointment. - Discuss with primary care physician about checking B12 and folate levels. - Encourage regular intake of B12 supplements. - Consult with primary care physician or neurologist about increasing gabapentin  dose or switching to Lyrica (pregabalin). - Consider referral to spine specialist for spinal cord stimulator evaluation. - Recommend capsaicin cream or lidocaine ointment for symptomatic relief. - Advise moisturizing feet with lotion to manage skin changes.      Return if symptoms worsen or fail to improve.

## 2024-05-04 ENCOUNTER — Telehealth: Payer: Self-pay

## 2024-05-04 NOTE — Transitions of Care (Post Inpatient/ED Visit) (Signed)
   05/04/2024  Name: TALLIN EHLY MRN: [id number] DOB: 1951/02/20  Today's TOC FU Call Status: Today's TOC FU Call Status:: Successful TOC FU Call Completed Unsuccessful Call (1st Attempt) Date: 05/04/24  Attempted to reach the patient regarding the most recent Inpatient/ED visit. Placed call to patient and wife answered. Wife reports she can not talk right now.  Requested a call back later.   Follow Up Plan: Additional outreach attempts will be made to reach the patient to complete the Transitions of Care (Post Inpatient/ED visit) call.   Orpha Blade, RN, BSN, CEN [employer]- Transition of Care Team.  [facility] [phone]

## 2024-05-07 ENCOUNTER — Telehealth: Payer: Self-pay

## 2024-05-07 NOTE — Transitions of Care (Post Inpatient/ED Visit) (Signed)
 05/07/2024  Name: Devin Daugherty MRN: [id number] DOB: June 11, 1951  Today's TOC FU Call Status: Today's TOC FU Call Status:: Successful TOC FU Call Completed TOC FU Call Complete Date: 05/07/24 Patient's Name and Date of Birth confirmed.  Transition Care Management Follow-up Telephone Call Date of Discharge: 05/03/24 Type of Discharge: Inpatient Admission How have you been since you were released from the hospital?: Better (Reports that he is doing well.  Not checking BP) Any questions or concerns?: No  Items Reviewed: Did you receive and understand the discharge instructions provided?: Yes Medications obtained,verified, and reconciled?: Yes (Medications Reviewed) Any new allergies since your discharge?: No Dietary orders reviewed?: Yes Type of Diet Ordered:: denies any special diet. Do you have support at home?: Yes People in Home [RPT]: spouse Name of Support/Comfort Primary Source: Amalia Badder  Medications Reviewed Today: Medications Reviewed Today     Reviewed by Vanetta Generous, RN (Registered Nurse) on 05/07/24 at 1403  Med List Status: <None>   Medication Order Taking? Sig Documenting Provider Last Dose Status Informant  carbidopa-levodopa (SINEMET IR) 25-100 MG tablet [id number] Yes Take 1 tablet by mouth 3 (three) times daily. [provider] Taking Active   cyclobenzaprine  (FLEXERIL ) 5 MG tablet [id number] Yes Take 1 tablet (5 mg total) by mouth 2 (two) times daily as needed for muscle spasms. Meldon Sport, MD Taking Active   fluvoxaMINE (LUVOX) 100 MG tablet [id number] Yes Take 100 mg by mouth at bedtime. [provider] Taking Active   fluvoxaMINE (LUVOX) 50 MG tablet [id number] Yes Take 50 mg by mouth in the morning. [provider] Taking Active   gabapentin  (NEURONTIN ) 300 MG capsule [id number] Yes Take 900 mg by mouth 4 (four) times daily. [provider] Taking Active   hydrOXYzine (VISTARIL) 25 MG capsule [id number] Yes Take 50 mg by  mouth 2 (two) times daily. Take 2 tablets twice a day [provider] Taking Active   isosorbide mononitrate (IMDUR) 60 MG 24 hr tablet [id number] No TAKE 1 TABLET BY MOUTH DAILY  Patient not taking: Reported on 05/07/2024   Meldon Sport, MD Not Taking Active   lisinopril (ZESTRIL) 40 MG tablet [id number] Yes Take 20 mg by mouth daily. [provider] Taking Active   lubiprostone  (AMITIZA ) 24 MCG capsule [id number] No Take 1 capsule (24 mcg total) by mouth daily with breakfast.  Patient not taking: Reported on 05/07/2024   Meldon Sport, MD Not Taking Active   mirtazapine  (REMERON  SOL-TAB) 30 MG disintegrating tablet [id number] Yes Take 15 mg by mouth at bedtime. [provider] Taking Active   QUEtiapine  (SEROQUEL ) 200 MG tablet [id number] Yes Take 200 mg by mouth at bedtime. [provider] Taking Active   rosuvastatin  (CRESTOR ) 40 MG tablet [id number] Yes TAKE 1 TABLET BY MOUTH DAILY Patel, Rutwik K, MD Taking Active   sildenafil  (REVATIO ) 20 MG tablet [id number] No Take 2-3 tablets po 1-2 hrs before sex  Patient not taking: Reported on 05/07/2024   Sherwood Donath, DO Not Taking Active   zolpidem  (AMBIEN ) 10 MG tablet [id number]  Take 1 tablet (10 mg total) by mouth at bedtime as needed for sleep. Lincoln Renshaw, NP  Expired 11/29/23 2359            Med Note (ROSE, Karmela Bram U   Mon May 07, 2024  2:03 PM) Taking            Home Care and Equipment/Supplies: Were Home Health Services Ordered?: No Any new equipment  or medical supplies ordered?: No  Functional Questionnaire: Do you need assistance with bathing/showering or dressing?: No Do you need assistance with meal preparation?: No Do you need assistance with eating?: No Do you have difficulty maintaining continence: No Do you need assistance with getting out of bed/getting out of a chair/moving?: No Do you have difficulty managing or taking your medications?: No  Follow up appointments  reviewed: PCP Follow-up appointment confirmed?: No (patient reports that he can call Dr. Bayard Limbo office) MD Provider Line Number:[phone] Given: Yes Specialist Hospital Follow-up appointment confirmed?: Yes Date of Specialist follow-up appointment?: 05/15/24 Follow-Up Specialty Provider:: Neurologist Do you need transportation to your follow-up appointment?: No Do you understand care options if your condition(s) worsen?: Yes-patient verbalized understanding  SDOH Interventions Today    Flowsheet Row Most Recent Value  SDOH Interventions   Food Insecurity Interventions Intervention Not Indicated  Housing Interventions Intervention Not Indicated  Transportation Interventions Intervention Not Indicated  Utilities Interventions Intervention Not Indicated      Patient reports that he is doing well. Reports that he is not monitoring BP. Encouraged to do so and take readings to MD appointments.  Reviewed importance of hydration and healthy eating. Encouraged patient to call PCP office and schedule hospital follow up.  Patient reports that he has all his medications and is taking them as prescribed.   Reviewed and offered 30 day TOC program and patient declines. Provided my contact number for patient to call me if needed.  Orpha Blade, RN, BSN, CEN [employer]- Transition of Care Team.  [facility] [phone]

## 2024-05-22 DIAGNOSIS — R252 Cramp and spasm: Secondary | ICD-10-CM | POA: Diagnosis not present

## 2024-05-22 DIAGNOSIS — I10 Essential (primary) hypertension: Secondary | ICD-10-CM | POA: Diagnosis not present

## 2024-05-24 DIAGNOSIS — I10 Essential (primary) hypertension: Secondary | ICD-10-CM | POA: Diagnosis not present

## 2024-05-24 DIAGNOSIS — K862 Cyst of pancreas: Secondary | ICD-10-CM | POA: Diagnosis not present

## 2024-05-24 DIAGNOSIS — K5909 Other constipation: Secondary | ICD-10-CM | POA: Diagnosis not present

## 2024-06-19 ENCOUNTER — Ambulatory Visit: Admitting: Internal Medicine

## 2024-06-19 ENCOUNTER — Encounter: Payer: Self-pay | Admitting: Internal Medicine

## 2024-06-19 VITALS — BP 108/68 | HR 67 | Ht 68.0 in | Wt 196.6 lb

## 2024-06-19 DIAGNOSIS — Z125 Encounter for screening for malignant neoplasm of prostate: Secondary | ICD-10-CM

## 2024-06-19 DIAGNOSIS — F331 Major depressive disorder, recurrent, moderate: Secondary | ICD-10-CM

## 2024-06-19 DIAGNOSIS — G2111 Neuroleptic induced parkinsonism: Secondary | ICD-10-CM

## 2024-06-19 DIAGNOSIS — F419 Anxiety disorder, unspecified: Secondary | ICD-10-CM | POA: Diagnosis not present

## 2024-06-19 DIAGNOSIS — R252 Cramp and spasm: Secondary | ICD-10-CM

## 2024-06-19 DIAGNOSIS — I25119 Atherosclerotic heart disease of native coronary artery with unspecified angina pectoris: Secondary | ICD-10-CM

## 2024-06-19 DIAGNOSIS — R739 Hyperglycemia, unspecified: Secondary | ICD-10-CM

## 2024-06-19 DIAGNOSIS — Z0001 Encounter for general adult medical examination with abnormal findings: Secondary | ICD-10-CM

## 2024-06-19 DIAGNOSIS — N1831 Chronic kidney disease, stage 3a: Secondary | ICD-10-CM | POA: Diagnosis not present

## 2024-06-19 DIAGNOSIS — I10 Essential (primary) hypertension: Secondary | ICD-10-CM | POA: Diagnosis not present

## 2024-06-19 DIAGNOSIS — E782 Mixed hyperlipidemia: Secondary | ICD-10-CM | POA: Diagnosis not present

## 2024-06-19 DIAGNOSIS — K5904 Chronic idiopathic constipation: Secondary | ICD-10-CM | POA: Diagnosis not present

## 2024-06-19 DIAGNOSIS — T43505A Adverse effect of unspecified antipsychotics and neuroleptics, initial encounter: Secondary | ICD-10-CM

## 2024-06-19 DIAGNOSIS — E559 Vitamin D deficiency, unspecified: Secondary | ICD-10-CM

## 2024-06-19 DIAGNOSIS — E871 Hypo-osmolality and hyponatremia: Secondary | ICD-10-CM

## 2024-06-19 MED ORDER — LUBIPROSTONE 24 MCG PO CAPS: 24.0000 ug | ORAL_CAPSULE | Freq: Every day | ORAL | 3 refills | Status: DC

## 2024-06-19 MED ORDER — CYCLOBENZAPRINE HCL 5 MG PO TABS: 5.0000 mg | ORAL_TABLET | Freq: Two times a day (BID) | ORAL | 1 refills | Status: DC | PRN

## 2024-06-19 NOTE — Assessment & Plan Note (Addendum)
 Uncontrolled On Luvox 50 mg QAM and 100 mg QHS, Mirtazepine 15 mg qHS and Seroquel  200 mg qHS - followed by Psychiatry at [facility] Also has Ambien  10 mg at bedtime for insomnia

## 2024-06-19 NOTE — Assessment & Plan Note (Addendum)
 BP Readings from Last 1 Encounters:  06/19/24 108/68   Well-controlled with Lisinopril 20 mg QD DC Imdur 60 mg QD from recent hospitalization He is unclear if he still takes amlodipine, advised to stop taking amlodipine if he is still taking it considering his low normal BP Followed by cardiology Counseled for compliance with the medications Advised DASH diet and moderate exercise/walking, at least 150 mins/week

## 2024-06-19 NOTE — Assessment & Plan Note (Addendum)
 Uncontrolled Unclear if parkinsonism has effect on his mood On Luvox 50 mg QAM and 100 mg QHS, Mirtazepine 15 mg qHS and Seroquel  200 mg qHS - followed by Psychiatry at [facility] health Also has Ambien  10 mg at bedtime for insomnia Advised to discuss about Cymbalta, which can also help with neuropathy

## 2024-06-19 NOTE — Assessment & Plan Note (Signed)
 Likely has IBS-C considering his history of uncontrolled MDD and GAD He was worried about SBO, but does not have nausea/vomiting; his bowel sounds are normal and abdomen is soft, nontender Has abdominal discomfort, and is afraid to eat due to severe constipation Has tried Colace, Senokot and MiraLAX without relief Started Amitiza due to persistent constipation despite trial of OTC stool softeners and laxatives Maintain adequate hydration

## 2024-06-19 NOTE — Assessment & Plan Note (Signed)
 Has chronic hyponatremia, likely due to SIADH from multiple psychiatric medications and volume overload from free water intake Also takes lisinopril for HTN Followed by nephrology Advised to maintain adequate hydration

## 2024-06-19 NOTE — Assessment & Plan Note (Signed)
 Ordered PSA after discussing its limitations for prostate cancer screening, including false positive results leading to additional investigations.

## 2024-06-19 NOTE — Assessment & Plan Note (Addendum)
 Tremors likely due to antipsychotic On Sinemet currently Neuropathic pain, leg cramps and constipation are likely due to parkinsonism He is placed on Seroquel , followed by psychiatry and neurology

## 2024-06-19 NOTE — Progress Notes (Signed)
 Established Patient Office Visit  Subjective:  Patient ID: Devin Daugherty, male    DOB: [birth year]  Age: 73 y.o. MRN: [id number]  CC:  Chief Complaint  Patient presents with   Spasms    Spasms and cramping in feet an legs   Annual Exam    HPI Devin Daugherty is a 73 y.o. male with past medical history of HTN, CAD s/p stent placement, HLD, depression with anxiety, CKD stage 3a, and medication induced parkinsonism who presents for f/u of his chronic medical conditions.  Constipation: He reports chronic constipation, his usual frequency is once or twice in a week.  He has been feeling worsening of constipation for the last 5 months. He denies any nausea or vomiting currently.  Denies any melena or hematochezia.  He has tried Colace, Senokot and MiraLAX without much relief.  He has tried taking magnesium  citrate, but had diarrhea with it.  He was given Amitiza  in the last visit, but has not tried it yet.  HTN: BP is well-controlled. Takes lisinopril 10 mg QD regularly. Patient denies headache, chest pain, dyspnea or palpitations.  Of note, he was hospitalized in 05/25 with hypotension.  Apparently, he was being prescribed amlodipine and Coreg by his cardiology provider in addition to lisinopril and Imdur.  He was told to stop Imdur from last hospitalization.  Today, he is unclear if he is still taking amlodipine and Coreg.  CAD: S/p stent placement, on Aspirin  and statin, followed by Cardiology, but needs follow-up.  Denies any chest pain currently.  Depression with anxiety: Followed by Psychiatry. He is on multiple medications. Denies any SI or HI.  He has had hospitalization for suicidal ideation/intentional drug overdose of carvedilol and lisinopril in 02/18.  He was admitted for inpatient psychiatry evaluation and has seen psychiatrist at [facility].  He is placed on Seroquel , Luvox and Remeron .  He also takes Xanax  1 mg twice daily PRN, but he still has severe anxiety.  He still  complains of apathy, severe anxiety, insomnia and decreased concentration.  He reports bilateral foot burning sensation and intermittent tingling as well.  He had NCS and EMG, which were unremarkable.  He takes gabapentin  300 mg 3 times daily for neuropathic pain. He also reports bilateral leg cramps, which are chronic and intermittent.  Of note, he has history of alcohol abuse and hyponatremia. He has been prescribed tizanidine by his neurologist, but he has not been taking it.  He takes Sinemet for drug induced parkinsonism.  Past Medical History:  Diagnosis Date   Anxiety    CAD (coronary artery disease)    Depression    High cholesterol    Hypertension     Past Surgical History:  Procedure Laterality Date   COLONOSCOPY     CORONARY ANGIOPLASTY WITH STENT PLACEMENT  05/2018   Heart  Stent  2009   HERNIA REPAIR      Family History  Problem Relation Age of Onset   Heart attack Father    Hyperlipidemia Son     Social History   Socioeconomic History   Marital status: Married    Spouse name: Not on file   Number of children: 2   Years of education: 14   Highest education level: Associate degree: occupational, Scientist, product/process development, or vocational program  Occupational History   Occupation: retired    Associate Professor: [employer]    Comment: welder  Tobacco Use   Smoking status: Never   Smokeless tobacco: Never  Advertising account planner  Vaping status: Never Used  Substance and Sexual Activity   Alcohol use: No   Drug use: No   Sexual activity: Yes    Partners: Female    Birth control/protection: None    Comment: married  Other Topics Concern   Not on file  Social History Narrative   Married, lives at home with spouse. Multiple children and grandchildren. 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 Active in local church. Likes to travel with spouse to the beach.    Social Drivers of Corporate investment banker Strain: Low Risk  (05/02/2024)   Received from [facility]   Overall  Financial Resource Strain (CARDIA)    Difficulty of Paying Living Expenses: Not hard at all  Food Insecurity: No Food Insecurity (05/07/2024)   Hunger Vital Sign    Worried About Running Out of Food in the Last Year: Never true    Ran Out of Food in the Last Year: Never true  Transportation Needs: No Transportation Needs (05/07/2024)   PRAPARE - Administrator, Civil Service (Medical): No    Lack of Transportation (Non-Medical): No  Physical Activity: Inactive (05/02/2024)   Received from [facility]   Exercise Vital Sign    On average, how many days per week do you engage in moderate to strenuous exercise (like a brisk walk)?: 0 days    On average, how many minutes do you engage in exercise at this level?: 0 min  Stress: No Stress Concern Present (05/02/2024)   Received from [facility] of Occupational Health - Occupational Stress Questionnaire    Feeling of Stress : Not at all  Recent Concern: Stress - Stress Concern Present (04/10/2024)   Received from [facility] of Occupational Health - Occupational Stress Questionnaire    Feeling of Stress : Rather much  Social Connections: Socially Integrated (05/02/2024)   Received from [facility]   Social Connection and Isolation Panel    In a typical week, how many times do you talk on the phone with family, friends, or neighbors?: More than three times a week    How often do you get together with friends or relatives?: More than three times a week    How often do you attend church or religious services?: More than 4 times per year    Do you belong to any clubs or organizations such as church groups, unions, fraternal or athletic groups, or school groups?: Yes    How often do you attend meetings of the clubs or organizations you belong to?: More than 4 times per year    Are you married, widowed, divorced, separated, never married, or living with a partner?: Married  Intimate Partner  Violence: Not At Risk (05/07/2024)   Humiliation, Afraid, Rape, and Kick questionnaire    Fear of Current or Ex-Partner: No    Emotionally Abused: No    Physically Abused: No    Sexually Abused: No    Outpatient Medications Prior to Visit  Medication Sig Dispense Refill   lisinopril (ZESTRIL) 10 MG tablet Take 10 mg by mouth daily.     carbidopa-levodopa (SINEMET IR) 25-100 MG tablet Take 1 tablet by mouth 3 (three) times daily.     fluvoxaMINE (LUVOX) 100 MG tablet Take 100 mg by mouth at bedtime.     fluvoxaMINE (LUVOX) 50 MG tablet Take 50 mg by mouth in the morning.  gabapentin  (NEURONTIN ) 300 MG capsule Take 300 mg by mouth 3 (three) times daily.     hydrOXYzine (VISTARIL) 25 MG capsule Take 50 mg by mouth 2 (two) times daily. Take 2 tablets twice a day     mirtazapine  (REMERON  SOL-TAB) 30 MG disintegrating tablet Take 15 mg by mouth at bedtime.     QUEtiapine  (SEROQUEL ) 200 MG tablet Take 200 mg by mouth at bedtime.     rosuvastatin  (CRESTOR ) 40 MG tablet TAKE 1 TABLET BY MOUTH DAILY 90 tablet 3   zolpidem  (AMBIEN ) 10 MG tablet Take 1 tablet (10 mg total) by mouth at bedtime as needed for sleep. 30 tablet 0   cyclobenzaprine  (FLEXERIL ) 5 MG tablet Take 1 tablet (5 mg total) by mouth 2 (two) times daily as needed for muscle spasms. 30 tablet 1   isosorbide mononitrate (IMDUR) 60 MG 24 hr tablet TAKE 1 TABLET BY MOUTH DAILY (Patient not taking: Reported on 05/07/2024) 90 tablet 3   lisinopril (ZESTRIL) 40 MG tablet Take 20 mg by mouth daily.     lubiprostone  (AMITIZA ) 24 MCG capsule Take 1 capsule (24 mcg total) by mouth daily with breakfast. (Patient not taking: Reported on 05/07/2024) 30 capsule 3   sildenafil  (REVATIO ) 20 MG tablet Take 2-3 tablets po 1-2 hrs before sex (Patient not taking: Reported on 05/07/2024) 24 tablet 5   No facility-administered medications prior to visit.    Allergies  Allergen Reactions   Codeine Itching    ROS Review of Systems  Constitutional:   Positive for fatigue. Negative for chills, fever and unexpected weight change.  HENT:  Negative for sinus pressure and sinus pain.   Eyes:  Negative for pain and discharge.  Respiratory:  Negative for cough and shortness of breath.   Cardiovascular:  Negative for chest pain and palpitations.  Gastrointestinal:  Positive for constipation. Negative for diarrhea, nausea and vomiting.  Genitourinary:  Negative for dysuria and hematuria.  Musculoskeletal:  Positive for back pain. Negative for neck pain and neck stiffness.  Skin:  Negative for rash.  Neurological:  Positive for tremors and numbness (B/l feet). Negative for dizziness and seizures.  Psychiatric/Behavioral:  Positive for decreased concentration, dysphoric mood and sleep disturbance. Negative for agitation and behavioral problems. The patient is nervous/anxious.       Objective:    Physical Exam Vitals reviewed.  Constitutional:      General: He is not in acute distress.    Appearance: He is not diaphoretic.  HENT:     Head: Normocephalic and atraumatic.     Nose: Nose normal.     Mouth/Throat:     Mouth: Mucous membranes are moist.   Eyes:     General: No scleral icterus.    Extraocular Movements: Extraocular movements intact.    Cardiovascular:     Rate and Rhythm: Normal rate and regular rhythm.     Heart sounds: Normal heart sounds. No murmur heard. Pulmonary:     Breath sounds: Normal breath sounds. No wheezing or rales.  Abdominal:     General: Bowel sounds are normal.     Palpations: Abdomen is soft.     Tenderness: There is no abdominal tenderness.   Musculoskeletal:     Cervical back: Neck supple. No tenderness.     Thoracic back: Tenderness present.     Lumbar back: Tenderness present. Decreased range of motion.     Right lower leg: No edema.     Left lower leg: No edema.  Feet:  Left foot:     Skin integrity: Callus (Between second and third toe) present.   Skin:    General: Skin is warm.      Findings: No rash.   Neurological:     General: No focal deficit present.     Mental Status: He is alert and oriented to person, place, and time.     Motor: Tremor (b/l UE) present.   Psychiatric:        Mood and Affect: Mood is anxious.        Behavior: Behavior normal.     BP 108/68   Pulse 67   Ht 5' 8 (1.727 m)   Wt 196 lb 9.6 oz (89.2 kg)   SpO2 95%   BMI 29.89 kg/m  Wt Readings from Last 3 Encounters:  06/19/24 196 lb 9.6 oz (89.2 kg)  04/30/24 200 lb (90.7 kg)  02/14/24 200 lb (90.7 kg)    Lab Results  Component Value Date   TSH 1.370 03/24/2023   Lab Results  Component Value Date   WBC 8.1 03/24/2023   HGB 14.4 03/24/2023   HCT 41.8 03/24/2023   MCV 85 03/24/2023   PLT 304 03/24/2023   Lab Results  Component Value Date   NA 128 (L) 03/24/2023   K 5.0 03/24/2023   CO2 21 03/24/2023   GLUCOSE 102 (H) 03/24/2023   BUN 9 03/24/2023   CREATININE 1.21 03/24/2023   BILITOT 0.5 03/24/2023   ALKPHOS 104 03/24/2023   AST 20 03/24/2023   ALT 20 03/24/2023   PROT 6.9 03/24/2023   ALBUMIN 4.1 03/24/2023   CALCIUM  9.4 03/24/2023   ANIONGAP 11 02/09/2023   EGFR 63 03/24/2023   Lab Results  Component Value Date   CHOL 103 03/24/2023   Lab Results  Component Value Date   HDL 35 (L) 03/24/2023   Lab Results  Component Value Date   LDLCALC 46 03/24/2023   Lab Results  Component Value Date   TRIG 122 03/24/2023   Lab Results  Component Value Date   CHOLHDL 2.9 03/24/2023   Lab Results  Component Value Date   HGBA1C 6.1 (H) 11/16/2023      Assessment & Plan:   Problem List Items Addressed This Visit       Cardiovascular and Mediastinum   Hypertension - Primary (Chronic)   BP Readings from Last 1 Encounters:  06/19/24 108/68   Well-controlled with Lisinopril 20 mg QD DC Imdur 60 mg QD from recent hospitalization He is unclear if he still takes amlodipine, advised to stop taking amlodipine if he is still taking it considering his low  normal BP Followed by cardiology Counseled for compliance with the medications Advised DASH diet and moderate exercise/walking, at least 150 mins/week      Relevant Medications   lisinopril (ZESTRIL) 10 MG tablet   Coronary artery disease involving native coronary artery of native heart with angina pectoris (HCC)   Followed by Cardiology On Aspirin  and statin Was on Coreg and Imdur as well, recently stopped due to hypotension leading to hospitalization      Relevant Medications   lisinopril (ZESTRIL) 10 MG tablet   Other Relevant Orders   TSH     Digestive   Chronic idiopathic constipation   Likely has IBS-C considering his history of uncontrolled MDD and GAD He was worried about SBO, but does not have nausea/vomiting; his bowel sounds are normal and abdomen is soft, nontender Has abdominal discomfort, and is afraid to eat due  to severe constipation Has tried Colace, Senokot and MiraLAX without relief Started Amitiza  due to persistent constipation despite trial of OTC stool softeners and laxatives Maintain adequate hydration       Relevant Medications   lubiprostone  (AMITIZA ) 24 MCG capsule   Other Relevant Orders   TSH     Nervous and Auditory   Neuroleptic-induced parkinsonism (HCC)   Tremors likely due to antipsychotic On Sinemet currently Neuropathic pain, leg cramps and constipation are likely due to parkinsonism He is placed on Seroquel , followed by psychiatry and neurology      Relevant Orders   TSH     Genitourinary   Stage 3 chronic kidney disease (HCC)   Stable stage 3a CKD, reviewed last BMP Avoid nephrotoxic agents Has been told to reduce free water intake due to hyponatremia On Lisinopril Followed by Nephrology      Relevant Orders   VITAMIN D  25 Hydroxy (Vit-D Deficiency, Fractures)   CMP14+EGFR   CBC with Differential/Platelet     Other   Anxiety (Chronic)   Uncontrolled Unclear if parkinsonism has effect on his mood On Luvox 50 mg QAM  and 100 mg QHS, Mirtazepine 15 mg qHS and Seroquel  200 mg qHS - followed by Psychiatry at [facility] health Also has Ambien  10 mg at bedtime for insomnia Advised to discuss about Cymbalta, which can also help with neuropathy      MDD (major depressive disorder), recurrent, moderate episode (Chronic)   Uncontrolled On Luvox 50 mg QAM and 100 mg QHS, Mirtazepine 15 mg qHS and Seroquel  200 mg qHS - followed by Psychiatry at [facility] health Also has Ambien  10 mg at bedtime for insomnia      Relevant Orders   TSH   Vitamin D  deficiency   Relevant Orders   VITAMIN D  25 Hydroxy (Vit-D Deficiency, Fractures)   Mixed hyperlipidemia   On Crestor  Check lipid profile      Relevant Medications   lisinopril (ZESTRIL) 10 MG tablet   Other Relevant Orders   Lipid panel   Hyponatremia   Has chronic hyponatremia, likely due to SIADH from multiple psychiatric medications and volume overload from free water intake Also takes lisinopril for HTN Followed by nephrology Advised to maintain adequate hydration      Encounter for general adult medical examination with abnormal findings   Physical exam as documented. Fasting blood tests today. Advised to get Shingrix vaccines at local pharmacy.      Prostate cancer screening   Ordered PSA after discussing its limitations for prostate cancer screening, including false positive results leading to additional investigations.      Relevant Orders   PSA   Leg cramps   Electrolyte abnormalities likely reason for leg cramps - history of alcohol abuse and hyponatremia Has been  advised to take thiamine supplement by psychiatry Advised to take magnesium  and co-Q10 for leg cramps Can also try tonic water as needed Flexeril  as needed for muscle spasms      Relevant Medications   cyclobenzaprine  (FLEXERIL ) 5 MG tablet   Other Visit Diagnoses       Hyperglycemia       Relevant Orders   Hemoglobin A1c         Meds ordered this encounter   Medications   cyclobenzaprine  (FLEXERIL ) 5 MG tablet    Sig: Take 1 tablet (5 mg total) by mouth 2 (two) times daily as needed for muscle spasms.    Dispense:  30 tablet    Refill:  1  lubiprostone  (AMITIZA ) 24 MCG capsule    Sig: Take 1 capsule (24 mcg total) by mouth daily with breakfast.    Dispense:  30 capsule    Refill:  3    Follow-up: Return in about 6 months (around 12/19/2024).    Suzzane MARLA Blanch, MD

## 2024-06-19 NOTE — Patient Instructions (Addendum)
 Please take Cyclobenzaprine  as needed for muscle cramps.  Please take Amitiza  as prescribed for constipation.  Please continue to take medications as prescribed.  Please continue to ambulate as tolerated.  Please consider discussing about Cymbalta to your Psychiatry provider.

## 2024-06-19 NOTE — Assessment & Plan Note (Signed)
Physical exam as documented. Fasting blood tests today. Advised to get Shingrix vaccines at local pharmacy.

## 2024-06-19 NOTE — Assessment & Plan Note (Addendum)
 Followed by Cardiology On Aspirin  and statin Was on Coreg and Imdur as well, recently stopped due to hypotension leading to hospitalization

## 2024-06-19 NOTE — Assessment & Plan Note (Addendum)
 Electrolyte abnormalities likely reason for leg cramps - history of alcohol abuse and hyponatremia Has been  advised to take thiamine supplement by psychiatry Advised to take magnesium  and co-Q10 for leg cramps Can also try tonic water as needed Flexeril  as needed for muscle spasms

## 2024-06-19 NOTE — Assessment & Plan Note (Signed)
On Crestor Check lipid profile 

## 2024-06-19 NOTE — Assessment & Plan Note (Addendum)
 Stable stage 3a CKD, reviewed last BMP Avoid nephrotoxic agents Has been told to reduce free water intake due to hyponatremia On Lisinopril Followed by Nephrology

## 2024-06-20 ENCOUNTER — Ambulatory Visit: Payer: Self-pay | Admitting: Internal Medicine

## 2024-06-20 LAB — CMP14+EGFR
ALT: 9 IU/L (ref 0–44)
AST: 14 IU/L (ref 0–40)
Albumin: 4.4 g/dL (ref 3.8–4.8)
Alkaline Phosphatase: 110 IU/L (ref 44–121)
BUN/Creatinine Ratio: 12 (ref 10–24)
BUN: 15 mg/dL (ref 8–27)
Bilirubin Total: 0.2 mg/dL (ref 0.0–1.2)
CO2: 17 mmol/L — ABNORMAL LOW (ref 20–29)
Calcium: 9.6 mg/dL (ref 8.6–10.2)
Chloride: 104 mmol/L (ref 96–106)
Creatinine, Ser: 1.27 mg/dL (ref 0.76–1.27)
Globulin, Total: 3 g/dL (ref 1.5–4.5)
Glucose: 65 mg/dL — ABNORMAL LOW (ref 70–99)
Potassium: 5.1 mmol/L (ref 3.5–5.2)
Sodium: 139 mmol/L (ref 134–144)
Total Protein: 7.4 g/dL (ref 6.0–8.5)
eGFR: 59 mL/min/{1.73_m2} — ABNORMAL LOW (ref >59)

## 2024-06-20 LAB — CBC WITH DIFFERENTIAL/PLATELET
Basophils Absolute: 0.1 10*3/uL (ref 0.0–0.2)
Basos: 1 %
EOS (ABSOLUTE): 0.1 10*3/uL (ref 0.0–0.4)
Eos: 1 %
Hematocrit: 45.1 % (ref 37.5–51.0)
Hemoglobin: 14.3 g/dL (ref 13.0–17.7)
Immature Grans (Abs): 0 10*3/uL (ref 0.0–0.1)
Immature Granulocytes: 0 %
Lymphocytes Absolute: 3 10*3/uL (ref 0.7–3.1)
Lymphs: 30 %
MCH: 28.3 pg (ref 26.6–33.0)
MCHC: 31.7 g/dL (ref 31.5–35.7)
MCV: 89 fL (ref 79–97)
Monocytes Absolute: 1.4 10*3/uL — ABNORMAL HIGH (ref 0.1–0.9)
Monocytes: 13 %
Neutrophils Absolute: 5.5 10*3/uL (ref 1.4–7.0)
Neutrophils: 55 %
Platelets: 299 10*3/uL (ref 150–450)
RBC: 5.06 x10E6/uL (ref 4.14–5.80)
RDW: 15 % (ref 11.6–15.4)
WBC: 10.1 10*3/uL (ref 3.4–10.8)

## 2024-06-20 LAB — LIPID PANEL
Chol/HDL Ratio: 3.1 ratio (ref 0.0–5.0)
Cholesterol, Total: 129 mg/dL (ref 100–199)
HDL: 42 mg/dL (ref >39)
LDL Chol Calc (NIH): 58 mg/dL (ref 0–99)
Triglycerides: 172 mg/dL — ABNORMAL HIGH (ref 0–149)
VLDL Cholesterol Cal: 29 mg/dL (ref 5–40)

## 2024-06-20 LAB — HEMOGLOBIN A1C
Est. average glucose Bld gHb Est-mCnc: 128 mg/dL
Hgb A1c MFr Bld: 6.1 % — ABNORMAL HIGH (ref 4.8–5.6)

## 2024-06-20 LAB — VITAMIN D 25 HYDROXY (VIT D DEFICIENCY, FRACTURES): Vit D, 25-Hydroxy: 32.9 ng/mL (ref 30.0–100.0)

## 2024-06-20 LAB — PSA: Prostate Specific Ag, Serum: 0.9 ng/mL (ref 0.0–4.0)

## 2024-06-20 LAB — TSH: TSH: 0.695 u[IU]/mL (ref 0.450–4.500)

## 2024-07-07 ENCOUNTER — Other Ambulatory Visit: Payer: Self-pay | Admitting: Internal Medicine

## 2024-07-07 DIAGNOSIS — R252 Cramp and spasm: Secondary | ICD-10-CM

## 2024-07-12 DIAGNOSIS — F410 Panic disorder [episodic paroxysmal anxiety] without agoraphobia: Secondary | ICD-10-CM | POA: Diagnosis not present

## 2024-07-12 DIAGNOSIS — I10 Essential (primary) hypertension: Secondary | ICD-10-CM | POA: Diagnosis not present

## 2024-07-12 DIAGNOSIS — F429 Obsessive-compulsive disorder, unspecified: Secondary | ICD-10-CM | POA: Diagnosis not present

## 2024-07-12 DIAGNOSIS — F411 Generalized anxiety disorder: Secondary | ICD-10-CM | POA: Diagnosis not present

## 2024-07-12 DIAGNOSIS — F330 Major depressive disorder, recurrent, mild: Secondary | ICD-10-CM | POA: Diagnosis not present

## 2024-07-13 ENCOUNTER — Ambulatory Visit (INDEPENDENT_AMBULATORY_CARE_PROVIDER_SITE_OTHER): Admitting: Podiatry

## 2024-07-13 VITALS — Ht 68.0 in | Wt 196.6 lb

## 2024-07-13 DIAGNOSIS — G629 Polyneuropathy, unspecified: Secondary | ICD-10-CM

## 2024-07-13 DIAGNOSIS — M79671 Pain in right foot: Secondary | ICD-10-CM

## 2024-07-13 DIAGNOSIS — G8929 Other chronic pain: Secondary | ICD-10-CM

## 2024-07-13 DIAGNOSIS — M79672 Pain in left foot: Secondary | ICD-10-CM | POA: Diagnosis not present

## 2024-07-13 NOTE — Patient Instructions (Signed)
 Capsaicin Topical System What is this medication? CAPSAICIN (cap SAY sin) treats nerve pain. It works by making your skin feel warm or cool, which blocks pain signals going to the brain. This medicine may be used for other purposes; ask your health care provider or pharmacist if you have questions. COMMON BRAND NAME(S): Qutenza What should I tell my care team before I take this medication? They need to know if you have any of these conditions: Have had a heart attack or stroke High blood pressure Large areas of burned or damaged skin An unusual or allergic reaction to capsaicin, hot peppers, other medications, foods, dyes, or preservatives Pregnant or trying to get pregnant Breastfeeding How should I use this medication? This medication is for external use only. It is applied by your care team in a hospital or clinic setting. Talk to your care team about the use of this medication in children. Special care may be needed. Overdosage: If you think you have taken too much of this medicine contact a poison control center or emergency room at once. NOTE: This medicine is only for you. Do not share this medicine with others. What if I miss a dose? This does not apply. What may interact with this medication? Interactions are not expected. Do not use any other skin products on the affected area without asking your care team. This list may not describe all possible interactions. Give your health care provider a list of all the medicines, herbs, non-prescription drugs, or dietary supplements you use. Also tell them if you smoke, drink alcohol, or use illegal drugs. Some items may interact with your medicine. What should I watch for while using this medication? Your condition will be monitored carefully while you are receiving this medication. Tell your care team if your symptoms do not start to get better or if they get worse. Talk to your care team about how to treat discomfort. You may place a  cooling pack from the refrigerator (not the freezer) on the area. Do not place it directly on the skin. Try not to touch the area where the patch was applied. If you do, wash your hands with soap and water right away. Your skin may be sensitive to heat for a few days after treatment. Avoid hot baths or showers, heating pads, and direct sunlight on the treated area. What side effects may I notice from receiving this medication? Side effects that you should report to your care team as soon as possible: Allergic reactions--skin rash, itching, hives, swelling of the face, lips, tongue, or throat Burning, itching, crusting, or peeling of treated skin Increase in blood pressure Numbness, decrease in sense of touch or sensation Side effects that usually do not require medical attention (report these to your care team if they continue or are bothersome): Mild skin irritation, redness, or dryness This list may not describe all possible side effects. Call your doctor for medical advice about side effects. You may report side effects to FDA at 1-800-FDA-1088. Where should I keep my medication? This medication is given in a hospital or clinic. It will not be stored at home. NOTE: This sheet is a summary. It may not cover all possible information. If you have questions about this medicine, talk to your doctor, pharmacist, or health care provider.  2024 Elsevier/Gold Standard (2023-11-25 00:00:00)

## 2024-07-13 NOTE — Progress Notes (Signed)
  Subjective:  Patient ID: Devin Daugherty, male    DOB: October 02, 1951,  MRN: [id number]  Chief Complaint  Patient presents with   Foot Pain    Rm 13 Patient is here as a follow-up for bilateral foot pain. Patient states there was no recommended therapy at the last visit. Patent states pain is a 10 on a scale of 1-10. Patient has tried otc pain medication, gabapentin  and hot/cold therapy with no relief.    Discussed the use of AI scribe software for clinical note transcription with the patient, who gave verbal consent to proceed.  History of Present Illness Devin Daugherty is a 73 year old male with Parkinson's disease who presents with burning and stinging pain in his feet.  He experiences burning and stinging sensations in his feet, extending up his legs, particularly affecting his calves with cramping and burning. The pain impacts his ability to walk. Gabapentin  has not alleviated the symptoms. A nerve conduction test conducted approximately a year ago returned normal results. Despite this, he continues to experience severe pain. He has tried various home remedies without relief. His neurologist focuses on managing Parkinson's symptoms without additional treatment for foot pain.      Objective:    Physical Exam General: AAO x3, NAD  Dermatological: There are no open lesions identified bilaterally.  Vascular: Dorsalis Pedis artery and Posterior Tibial artery pedal pulses are 2/4 bilateral with immedate capillary fill time. There is no pain with calf compression, swelling, warmth, erythema.   Neruologic: Sensation intact with SWMF.   Musculoskeletal: Not able to appreciate any area pinpoint tenderness.  Describes diffuse discomfort across bilateral lower extremities, describing burning, tingling sensations.    No images are attached to the encounter.    Results    Assessment:   1. Small fiber neuropathy   2. Chronic pain in right foot   3. Chronic foot pain, left       Plan:  Patient was evaluated and treated and all questions answered.  Assessment and Plan Assessment & Plan Small fiber neuropathy Chronic burning and stinging pain in feet and legs. Suspected possile small fiber neuropathy despite normal nerve conduction test. Gabapentin  adjustment limited due to other uses unless approved by his PCP or psychiatrist. - Submit insurance approval for [name] after discussing benefits and side effects.  - He is on gabapentin  for other issues per psychiatry.  Could consider or changing to Lyrica, Cymbalta, and amitriptyline. - Consider referral to pain management for alternative treatments.    No follow-ups on file. -Pending Qutenza approval  Devin Daugherty DPM

## 2024-07-23 ENCOUNTER — Telehealth: Payer: Self-pay | Admitting: Podiatry

## 2024-07-23 NOTE — Telephone Encounter (Signed)
 Patient is asking if insurance approved Qutenza?

## 2024-08-02 DIAGNOSIS — I2510 Atherosclerotic heart disease of native coronary artery without angina pectoris: Secondary | ICD-10-CM | POA: Diagnosis not present

## 2024-08-02 DIAGNOSIS — E782 Mixed hyperlipidemia: Secondary | ICD-10-CM | POA: Diagnosis not present

## 2024-08-02 DIAGNOSIS — I10 Essential (primary) hypertension: Secondary | ICD-10-CM | POA: Diagnosis not present

## 2024-08-21 ENCOUNTER — Other Ambulatory Visit: Payer: Self-pay | Admitting: Internal Medicine

## 2024-08-21 DIAGNOSIS — R252 Cramp and spasm: Secondary | ICD-10-CM

## 2024-08-30 ENCOUNTER — Emergency Department (HOSPITAL_COMMUNITY)

## 2024-08-30 ENCOUNTER — Encounter (HOSPITAL_COMMUNITY): Payer: Self-pay

## 2024-08-30 ENCOUNTER — Other Ambulatory Visit: Payer: Self-pay

## 2024-08-30 ENCOUNTER — Emergency Department (HOSPITAL_COMMUNITY)
Admission: EM | Admit: 2024-08-30 | Discharge: 2024-08-30 | Disposition: A | Discharge status: Home / Self Care | Attending: Emergency Medicine | Admitting: Emergency Medicine

## 2024-08-30 DIAGNOSIS — R4182 Altered mental status, unspecified: Secondary | ICD-10-CM | POA: Diagnosis not present

## 2024-08-30 DIAGNOSIS — R519 Headache, unspecified: Secondary | ICD-10-CM | POA: Diagnosis not present

## 2024-08-30 DIAGNOSIS — R55 Syncope and collapse: Secondary | ICD-10-CM | POA: Insufficient documentation

## 2024-08-30 DIAGNOSIS — I10 Essential (primary) hypertension: Secondary | ICD-10-CM | POA: Diagnosis not present

## 2024-08-30 DIAGNOSIS — F32A Depression, unspecified: Secondary | ICD-10-CM | POA: Insufficient documentation

## 2024-08-30 DIAGNOSIS — M62838 Other muscle spasm: Secondary | ICD-10-CM | POA: Insufficient documentation

## 2024-08-30 DIAGNOSIS — G629 Polyneuropathy, unspecified: Secondary | ICD-10-CM | POA: Diagnosis not present

## 2024-08-30 DIAGNOSIS — Z79899 Other long term (current) drug therapy: Secondary | ICD-10-CM | POA: Diagnosis not present

## 2024-08-30 DIAGNOSIS — M6240 Contracture of muscle, unspecified site: Secondary | ICD-10-CM

## 2024-08-30 DIAGNOSIS — Z7982 Long term (current) use of aspirin: Secondary | ICD-10-CM | POA: Diagnosis not present

## 2024-08-30 DIAGNOSIS — I6782 Cerebral ischemia: Secondary | ICD-10-CM | POA: Diagnosis not present

## 2024-08-30 DIAGNOSIS — G20B2 Parkinson's disease with dyskinesia, with fluctuations: Secondary | ICD-10-CM | POA: Diagnosis not present

## 2024-08-30 DIAGNOSIS — I2510 Atherosclerotic heart disease of native coronary artery without angina pectoris: Secondary | ICD-10-CM | POA: Diagnosis not present

## 2024-08-30 DIAGNOSIS — R569 Unspecified convulsions: Secondary | ICD-10-CM | POA: Diagnosis not present

## 2024-08-30 DIAGNOSIS — I6523 Occlusion and stenosis of bilateral carotid arteries: Secondary | ICD-10-CM | POA: Diagnosis not present

## 2024-08-30 HISTORY — DX: Obsessive-compulsive disorder, unspecified: F42.9

## 2024-08-30 HISTORY — DX: Unspecified osteoarthritis, unspecified site: M19.90

## 2024-08-30 HISTORY — DX: Gastro-esophageal reflux disease without esophagitis: K21.9

## 2024-08-30 HISTORY — DX: Unspecified abdominal hernia without obstruction or gangrene: K46.9

## 2024-08-30 HISTORY — DX: Parkinson's disease without dyskinesia, without mention of fluctuations: G20.A1

## 2024-08-30 HISTORY — DX: Cyst of pancreas: K86.2

## 2024-08-30 LAB — COMPREHENSIVE METABOLIC PANEL WITH GFR
ALT: 21 U/L (ref 0–44)
AST: 20 U/L (ref 15–41)
Albumin: 4.5 g/dL (ref 3.5–5.0)
Alkaline Phosphatase: 87 U/L (ref 38–126)
Anion gap: 11 (ref 5–15)
BUN: 18 mg/dL (ref 8–23)
CO2: 21 mmol/L — ABNORMAL LOW (ref 22–32)
Calcium: 9.3 mg/dL (ref 8.9–10.3)
Chloride: 103 mmol/L (ref 98–111)
Creatinine, Ser: 1.24 mg/dL (ref 0.61–1.24)
GFR, Estimated: >60 mL/min (ref >60)
Glucose, Bld: 100 mg/dL — ABNORMAL HIGH (ref 70–99)
Potassium: 3.5 mmol/L (ref 3.5–5.1)
Sodium: 135 mmol/L (ref 135–145)
Total Bilirubin: 0.8 mg/dL (ref 0.0–1.2)
Total Protein: 8.2 g/dL — ABNORMAL HIGH (ref 6.5–8.1)

## 2024-08-30 LAB — TROPONIN I (HIGH SENSITIVITY)
Troponin I (High Sensitivity): 5 ng/L (ref <18)
Troponin I (High Sensitivity): 8 ng/L (ref <18)

## 2024-08-30 LAB — CBC WITH DIFFERENTIAL/PLATELET
Abs Immature Granulocytes: 0.03 K/uL (ref 0.00–0.07)
Basophils Absolute: 0.1 K/uL (ref 0.0–0.1)
Basophils Relative: 1 %
Eosinophils Absolute: 0 K/uL (ref 0.0–0.5)
Eosinophils Relative: 0 %
HCT: 41.9 % (ref 39.0–52.0)
Hemoglobin: 14.5 g/dL (ref 13.0–17.0)
Immature Granulocytes: 0 %
Lymphocytes Relative: 23 %
Lymphs Abs: 1.9 K/uL (ref 0.7–4.0)
MCH: 29.4 pg (ref 26.0–34.0)
MCHC: 34.6 g/dL (ref 30.0–36.0)
MCV: 84.8 fL (ref 80.0–100.0)
Monocytes Absolute: 0.8 K/uL (ref 0.1–1.0)
Monocytes Relative: 10 %
Neutro Abs: 5.6 K/uL (ref 1.7–7.7)
Neutrophils Relative %: 66 %
Platelets: 274 K/uL (ref 150–400)
RBC: 4.94 MIL/uL (ref 4.22–5.81)
RDW: 13.2 % (ref 11.5–15.5)
WBC: 8.5 K/uL (ref 4.0–10.5)
nRBC: 0 % (ref 0.0–0.2)

## 2024-08-30 LAB — CBG MONITORING, ED: Glucose-Capillary: 96 mg/dL (ref 70–99)

## 2024-08-30 LAB — CK: Total CK: 109 U/L (ref 49–397)

## 2024-08-30 LAB — MAGNESIUM: Magnesium: 2.2 mg/dL (ref 1.7–2.4)

## 2024-08-30 MED ORDER — METHOCARBAMOL 500 MG PO TABS: 500.0000 mg | ORAL_TABLET | Freq: Two times a day (BID) | ORAL | 0 refills | Status: DC

## 2024-08-30 MED ORDER — KETOROLAC TROMETHAMINE 15 MG/ML IJ SOLN
15.0000 mg | Freq: Once | INTRAMUSCULAR | Status: AC
  Administered 2024-08-30: 15 mg via INTRAVENOUS
  Filled 2024-08-30: qty 1

## 2024-08-30 MED ORDER — DIAZEPAM 5 MG/ML IJ SOLN
2.5000 mg | Freq: Once | INTRAMUSCULAR | Status: AC
  Administered 2024-08-30: 2.5 mg via INTRAVENOUS
  Filled 2024-08-30: qty 2

## 2024-08-30 NOTE — ED Notes (Signed)
 Edp at bedside

## 2024-08-30 NOTE — Plan of Care (Signed)
 On-call neurology note  Received a call from ED provider regarding this patient whose had an episode of loss of consciousness and is having more muscle spasms as well as hallucinations and increased pain all over his body.  History of parkinsonism on Sinemet as well as Seroquel .  Symptoms abated in the ER with Valium  administration both in terms of his agitation and spasms.  Family is concerned with some worsening symptoms of altered mentation and confusion, which is not necessarily all new but somewhat worse than baseline. I would recommend admission for syncope versus seizure workup to include an echocardiogram and EEG as well as telemetry. I would also check all the labs including any evidence of underlying mild infection such as UTI or pneumonia. Routine EEG should be possible on Friday morning by our technologist at any [facility] so I would recommend keeping him there and reaching out to Dr. Shelton for a routine consultation via telemedicine in the morning.  Eligio Lav, MD Neurology

## 2024-08-30 NOTE — ED Notes (Signed)
 Placed mitts on pt for safety d/t pt attempting to scratch and claw at face/eyes.

## 2024-08-30 NOTE — ED Triage Notes (Signed)
 Pt reports he has not been taking his medications as he should be. Pt reports he needs Fentanyl  for his leg pain.

## 2024-08-30 NOTE — ED Notes (Signed)
 Unable to get accurate bp due to restless. Pt stating the devil gonna get my eyeballs out. Asked if pt is seeing things or hearing voices and he stated no.

## 2024-08-30 NOTE — ED Notes (Signed)
 Pt a/o. Shaking all over. Wife states he has parkinsons but has never shook this bad. Pt has had about 5 episodes of shaking so bad that legs are kicking up high up and down. Upper body is more flexed and pt will grimace. Has been conscious through all except one -I tapped shoulder and said his name, was not grimacing and lasted approx 10 seconds then opened eyes. Edp at bedside at 1544 and advised. Air cut down in room due to pt being hot. Pt tearful due to all over pain. Wife at bedside. Wife stated not to give ativan  due to them having to wean him off of it years ago.

## 2024-08-30 NOTE — ED Provider Notes (Signed)
 [facility] EMERGENCY DEPARTMENT AT [facility] Provider Note   CSN: [id number] Arrival date & time: 08/30/24  1517     History  Chief Complaint  Patient presents with   Seizures    Devin Daugherty is a 73 y.o. male with Parkinson's disease, HTN, CAD, depression, OCD who presents with  home c/o uncontrolled seizures. During Triage patient experienced multiple 45 sec seizures with severe all over shaking. He is conscious during the episodes but she does report that twice last night he lost consciousness. This did happen a couple of months ago as well and he spent several days in the [facility]. Wife states it has never been this severe. He is currently being worked up by multiple providers for muscle cramping in his legs.  Last saw neurology on 05/22/2024 where they noted a normal nerve conduction study previously and they increased his baclofen dosage, added high-dose magnesium  oxide, and suggested a potential TCA if these treatments did not work. He reports that none of the medications are helping and he threw his medicines in the garbage this morning. Patient's wife also reports that over the last year he has become increasingly depressed, intermittently confused. He talks about the devil a lot. He does follow with a psychiatrist and has an appt next Wednesday. She states he is extremely negative and a positive thought never enters his brain.   Past Medical History:  Diagnosis Date   Anxiety    CAD (coronary artery disease)    Depression    GERD (gastroesophageal reflux disease)    Hernia of abdominal cavity    High cholesterol    Hypertension    OCD (obsessive compulsive disorder)    Osteoarthritis    Pancreatic cyst    Parkinson disease (HCC)        Home Medications Prior to Admission medications   Medication Sig Start Date End Date Taking? Authorizing Provider  amLODipine (NORVASC) 5 MG tablet Take 5 mg by mouth daily.   Yes [provider]   ASPIRIN  LOW DOSE 81 MG tablet Take 81 mg by mouth daily.   Yes [provider]  baclofen (LIORESAL) 10 MG tablet Take 10 mg by mouth 3 (three) times daily as needed for muscle spasms. 05/26/24  Yes [provider]  carbidopa-levodopa (SINEMET IR) 25-100 MG tablet Take 1 tablet by mouth 3 (three) times daily. 03/21/23  Yes [provider]  carvedilol (COREG) 6.25 MG tablet Take 6.25 mg by mouth 2 (two) times daily.   Yes [provider]  CONSTULOSE 10 GM/15ML solution Take 20 g by mouth daily as needed for mild constipation.   Yes [provider]  cyclobenzaprine  (FLEXERIL ) 5 MG tablet TAKE 1 TABLET BY MOUTH TWICE DAILY AS NEEDED FOR MUSCLE SPASMS 08/21/24  Yes Patel, Rutwik K, MD  fluvoxaMINE (LUVOX) 100 MG tablet Take 100 mg by mouth at bedtime.   Yes [provider]  fluvoxaMINE (LUVOX) 50 MG tablet Take 50 mg by mouth in the morning.   Yes [provider]  gabapentin  (NEURONTIN ) 300 MG capsule Take 300 mg by mouth 3 (three) times daily.   Yes [provider]  hydrOXYzine (VISTARIL) 25 MG capsule Take 50 mg by mouth 2 (two) times daily. 03/21/23  Yes [provider]  lisinopril (ZESTRIL) 10 MG tablet Take 10 mg by mouth daily.   Yes [provider]  methocarbamol  (ROBAXIN ) 500 MG tablet Take 1 tablet (500 mg total) by mouth 2 (two) times daily.  08/30/24  Yes Franklyn Sid SAILOR, MD  mirtazapine  (REMERON ) 15 MG tablet Take 15 mg by mouth at bedtime. 02/04/23 10/19/24 Yes [provider]  QUEtiapine  (SEROQUEL ) 200 MG tablet Take 200 mg by mouth at bedtime.   Yes [provider]  QUEtiapine  (SEROQUEL ) 50 MG tablet Take 50 mg by mouth in the morning. 08/15/24 10/14/24 Yes [provider]  rosuvastatin  (CRESTOR ) 40 MG tablet TAKE 1 TABLET BY MOUTH DAILY Patient taking differently: Take 40 mg by mouth at bedtime. 10/07/23  Yes Tobie Suzzane POUR, MD  zolpidem  (AMBIEN ) 10 MG tablet Take 1 tablet (10 mg  total) by mouth at bedtime as needed for sleep. Patient taking differently: Take 10 mg by mouth at bedtime. 03/03/23 08/30/24 Yes Teresa Redell LABOR, NP      Allergies    Tizanidine and Codeine    Review of Systems   Review of Systems A 10 point review of systems was performed and is negative unless otherwise reported in HPI.  Physical Exam Updated Vital Signs BP (!) 151/79   Pulse 86   Temp 97.9 F (36.6 C) (Oral)   Resp (!) 25   Ht 5' 8 (1.727 m)   Wt 90 kg   SpO2 96%   BMI 30.17 kg/m  Physical Exam General: Normal appearing male, lying in bed.  HEENT: PERRLA, EOMI, no nystagmus, sclera anicteric, MMM, trachea midline.  Tongue protrudes midline Cardiology: RRR, no murmurs/rubs/gallops.  Resp: Normal respiratory rate and effort. CTAB, no wheezes, rhonchi, crackles.  Abd: Soft, non-tender, non-distended. No rebound tenderness or guarding.  GU: Deferred. MSK: No peripheral edema or signs of trauma. Extremities without deformity or TTP. No cyanosis or clubbing. Skin: warm, dry.  Neuro: A&Ox4, CNs II-XII grossly intact.  5 out of 5 strength in all extremities.  Sensation grossly intact.  Intermittent coarse tremors that intermittently become more violent in the bilateral upper extremities, then subside.  Patient is alert and responsive during these episodes.  Mild resting tremor bilateral upper extremities noted.  Normal speech. Psych: Normal mood and affect.   ED Results / Procedures / Treatments   Labs (all labs ordered are listed, but only abnormal results are displayed) Labs Reviewed  COMPREHENSIVE METABOLIC PANEL WITH GFR - Abnormal; Notable for the following components:      Result Value   CO2 21 (*)    Glucose, Bld 100 (*)    Total Protein 8.2 (*)    All other components within normal limits  CBC WITH DIFFERENTIAL/PLATELET  MAGNESIUM   CK  CBG MONITORING, ED  TROPONIN I (HIGH SENSITIVITY)  TROPONIN I (HIGH SENSITIVITY)    EKG EKG Interpretation Date/Time:  Thursday  August 30 2024 16:01:36 EDT Ventricular Rate:  91 PR Interval:  193 QRS Duration:  96 QT Interval:  335 QTC Calculation: 413 R Axis:   47  Text Interpretation: Sinus rhythm Borderline repolarization abnormality Confirmed by Lorette Mayo [phone]) on 08/31/2024 10:13:07 PM  Radiology CT Head Wo Contrast Result Date: 08/30/2024 EXAM: CT HEAD WITHOUT CONTRAST 08/30/2024 06:22:56 PM TECHNIQUE: CT of the head was performed without the administration of intravenous contrast. Automated exposure control, iterative reconstruction, and/or weight based adjustment of the mA/kV was utilized to reduce the radiation dose to as low as reasonably achievable. COMPARISON: None available. CLINICAL HISTORY: Mental status change, unknown cause; Seizure, new-onset, no history of trauma. Pt a/o. Shaking all over. Wife states he has parkinsons but has never shook this bad. Pt has had about 5 episodes of shaking so bad  that legs are kicking up high up and down. Upper body is more flexed and pt will grimace. Has been conscious through all ; except one -I tapped shoulder and said his name, was not grimacing and lasted approx 10 seconds then opened eyes. Edp at bedside at 1544 and advised. Air cut down in room due to pt being hot. Pt tearful due to all over pain. Wife at bedside. Wife stated ; not to give ativan  due to them having to wean him off of it years ago. FINDINGS: BRAIN AND VENTRICLES: No acute hemorrhage. No evidence of acute infarct. No hydrocephalus. No extra-axial collection. No mass effect or midline shift. There is mild hypoattenuation in the periventricular and subcortical white matter suggestive of mild chronic microvascular ischemic changes. ORBITS: No acute abnormality. SINUSES: No acute abnormality. SOFT TISSUES AND SKULL: No acute soft tissue abnormality. No skull fracture. VASCULATURE: Atherosclerosis of the bilateral carotid siphons. IMPRESSION: 1. No acute intracranial abnormality. 2. Mild chronic  microvascular ischemic changes. Electronically signed by: Donnice Mania MD 08/30/2024 07:05 PM EDT RP Workstation: [id number]    Procedures Procedures    Medications Ordered in ED Medications  diazepam  (VALIUM ) injection 2.5 mg (2.5 mg Intravenous Given 08/30/24 1618)  diazepam  (VALIUM ) injection 2.5 mg (2.5 mg Intravenous Given 08/30/24 1711)  ketorolac  (TORADOL ) 15 MG/ML injection 15 mg (15 mg Intravenous Given 08/30/24 1711)    ED Course/ Medical Decision Making/ A&P                          Medical Decision Making Amount and/or Complexity of Data Reviewed Labs: ordered. Radiology: ordered.  Risk Prescription drug management.    This patient presents to the ED for concern of seizure-like activity, LOC, this involves an extensive number of treatment options, and is a complaint that carries with it a high risk of complications and morbidity.  I considered the following differential and admission for this acute, potentially life threatening condition.   MDM:    Patient with No electrolyte derangements, no hypo/hyperglycemia. CK negative for rhabdo. No renal injury, leukocytosis, or anemia. No h/o GIB. No arrhythmia or signs of ischemia on EKG. Trop negx2, no c/f ACS. CTH neg for ICH, hydrocephalus. His sxs are improved by IV diazepam . Patient c/o coarse tremors and seizure-like activity though patient is responsive during episodes of BL UE shaking; this is non-epileptic in nature, and I suspect likely this is related to parkinson's disease and depression/anxiety, though he does report two episodes of LOC yesterday. In this context patient will likely need EEG and MRI imaging. I discussed this with wife and patient, and patient would prefer to be discharged. Consider depression/anxiety, delirium, psychosis related to depression but he is A&Ox4 now and reports understanding of risks of leaving AGAINST MEDICAL ADVICE. He already has outpatient f/u with psychiatry and does follow with a  neurologist. Discussed with the patient and the wife extensively about his sxs and disease process - they ask many questions about parkinson's disease, including whether his sxs are reversible. We discuss the unfortunate irreversible nature of parkinson's disease as well as dementia, and importance of medication compliance to help control sxs. Patient will be leaving against medical advice and understands risks of leaving including worsening of condition up to and including seizures, coma, and death. Will prescribe robaxin  PO and instruct to try that, but to not take it concurrently with baclofen. They report understanding and will f/u with neurologist and psychiatrist.   Clinical Course  as of 09/04/24 1012  Thu Aug 30, 2024  1613 CMP, CBC, CK, and Mg all unremarkable [HN]  1717 Patient began to state that the devil is trying to rip his eyeballs out of his head. [HN]  1944 1. No acute intracranial abnormality. 2. Mild chronic microvascular ischemic changes.  [HN]    Clinical Course User Index [HN] Franklyn Sid SAILOR, MD    Labs: I Ordered, and personally interpreted labs.  The pertinent results include: Those listed above  Imaging Studies ordered: I ordered imaging studies including CT head I independently visualized and interpreted imaging. I agree with the radiologist interpretation  Additional history obtained from chart review, wife at bedside.    Cardiac Monitoring: The patient was maintained on a cardiac monitor.  I personally viewed and interpreted the cardiac monitored which showed an underlying rhythm of: Normal sinus rhythm  Reevaluation: After the interventions noted above, I reevaluated the patient and found that they have :improved  Social Determinants of Health:  lives independently  Disposition: AMA  Co morbidities that complicate the patient evaluation  Past Medical History:  Diagnosis Date   Anxiety    CAD (coronary artery disease)    Depression    GERD  (gastroesophageal reflux disease)    Hernia of abdominal cavity    High cholesterol    Hypertension    OCD (obsessive compulsive disorder)    Osteoarthritis    Pancreatic cyst    Parkinson disease (HCC)      Medicines Meds ordered this encounter  Medications   diazepam  (VALIUM ) injection 2.5 mg   diazepam  (VALIUM ) injection 2.5 mg   ketorolac  (TORADOL ) 15 MG/ML injection 15 mg   methocarbamol  (ROBAXIN ) 500 MG tablet    Sig: Take 1 tablet (500 mg total) by mouth 2 (two) times daily.    Dispense:  20 tablet    Refill:  0    I have reviewed the patients home medicines and have made adjustments as needed  Problem List / ED Course: Problem List Items Addressed This Visit   None Visit Diagnoses       Parkinson's disease with dyskinesia and fluctuating manifestations (HCC)    -  Primary     Intractable muscle spasm         Depression, unspecified depression type       Relevant Medications   mirtazapine  (REMERON ) 15 MG tablet   diazepam  (VALIUM ) injection 2.5 mg (Completed)   diazepam  (VALIUM ) injection 2.5 mg (Completed)     Seizure-like activity (HCC)         Syncope and collapse         Neuropathy                       This note was created using dictation software, which may contain spelling or grammatical errors.    Franklyn Sid SAILOR, MD 09/04/24 1017

## 2024-08-30 NOTE — Discharge Instructions (Addendum)
 Thank you for coming to [facility] Emergency Department. You were seen for seizure-like activity and increased muscle spasms with pain as well as hallucinations about the devil and depression.  We recommended that you be admitted to the hospital for further workup including MRI and EEG but you decided to be discharged instead AGAINST MEDICAL ADVICE.  It is possible that you may have worsening of your condition up to and including death as a result of leaving here today.  You reported understanding of this.  Please follow-up outpatient with your neurologist within the next 1 to 2 weeks for further workup and management as well as your psychiatrist.  For your muscle spasms please try taking Robaxin  instead of baclofen.  Please do not take them at the same time.  Please stay well-hydrated at home.  Please take your medications as prescribed including your Sinemet.  Attached are several pages of information, including about Parkinson's disease.  Do not hesitate to return to the ED or call 911 if you experience: -Worsening symptoms -Chest pain, shortness of breath -Strokelike symptoms including asymmetric weakness, asymmetric numbness or tingling, slurred speech, facial droop, word finding difficulty -Severe headache or visual changes -Lightheadedness, passing out -Fevers/chills -Anything else that concerns you

## 2024-08-30 NOTE — ED Triage Notes (Signed)
 Pt arrived via POV from home c/o uncontrolled seizures. During Triage Pt experienced multiple 45sec seizures with leg and arm shaking.

## 2024-08-30 NOTE — ED Notes (Signed)
 Pt getting more relaxed. Nad.

## 2024-09-01 DIAGNOSIS — R251 Tremor, unspecified: Secondary | ICD-10-CM | POA: Diagnosis not present

## 2024-09-04 DIAGNOSIS — G894 Chronic pain syndrome: Secondary | ICD-10-CM | POA: Diagnosis not present

## 2024-09-04 DIAGNOSIS — I10 Essential (primary) hypertension: Secondary | ICD-10-CM | POA: Diagnosis not present

## 2024-09-05 DIAGNOSIS — F330 Major depressive disorder, recurrent, mild: Secondary | ICD-10-CM | POA: Diagnosis not present

## 2024-09-05 DIAGNOSIS — F429 Obsessive-compulsive disorder, unspecified: Secondary | ICD-10-CM | POA: Diagnosis not present

## 2024-09-05 DIAGNOSIS — F411 Generalized anxiety disorder: Secondary | ICD-10-CM | POA: Diagnosis not present

## 2024-09-05 DIAGNOSIS — I10 Essential (primary) hypertension: Secondary | ICD-10-CM | POA: Diagnosis not present

## 2024-09-05 DIAGNOSIS — F410 Panic disorder [episodic paroxysmal anxiety] without agoraphobia: Secondary | ICD-10-CM | POA: Diagnosis not present

## 2024-09-10 ENCOUNTER — Other Ambulatory Visit: Payer: Self-pay | Admitting: Podiatry

## 2024-09-10 MED ORDER — QUTENZA 8 % EX KIT: 4.0000 | PACK | Freq: Once | CUTANEOUS | 0 refills | Status: AC

## 2024-09-12 DIAGNOSIS — F1021 Alcohol dependence, in remission: Secondary | ICD-10-CM | POA: Diagnosis not present

## 2024-09-12 DIAGNOSIS — F411 Generalized anxiety disorder: Secondary | ICD-10-CM | POA: Diagnosis not present

## 2024-09-12 DIAGNOSIS — G3184 Mild cognitive impairment, so stated: Secondary | ICD-10-CM | POA: Diagnosis not present

## 2024-09-12 DIAGNOSIS — F333 Major depressive disorder, recurrent, severe with psychotic symptoms: Secondary | ICD-10-CM | POA: Diagnosis not present

## 2024-09-14 ENCOUNTER — Telehealth: Payer: Self-pay

## 2024-09-14 NOTE — Telephone Encounter (Signed)
 PA request received from [facility] for Qutenza  ( 4 patches) 8% kit. PA submitted through CoverMyMeds and waiting on response.  Devin Daugherty  (Key: [id number])

## 2024-09-19 DIAGNOSIS — F331 Major depressive disorder, recurrent, moderate: Secondary | ICD-10-CM | POA: Diagnosis not present

## 2024-09-19 DIAGNOSIS — F1021 Alcohol dependence, in remission: Secondary | ICD-10-CM | POA: Diagnosis not present

## 2024-09-19 DIAGNOSIS — I10 Essential (primary) hypertension: Secondary | ICD-10-CM | POA: Diagnosis not present

## 2024-09-19 DIAGNOSIS — G3184 Mild cognitive impairment, so stated: Secondary | ICD-10-CM | POA: Diagnosis not present

## 2024-09-19 DIAGNOSIS — F429 Obsessive-compulsive disorder, unspecified: Secondary | ICD-10-CM | POA: Diagnosis not present

## 2024-09-19 DIAGNOSIS — F411 Generalized anxiety disorder: Secondary | ICD-10-CM | POA: Diagnosis not present

## 2024-09-26 DIAGNOSIS — F411 Generalized anxiety disorder: Secondary | ICD-10-CM | POA: Diagnosis not present

## 2024-09-26 DIAGNOSIS — R413 Other amnesia: Secondary | ICD-10-CM | POA: Diagnosis not present

## 2024-09-26 DIAGNOSIS — F332 Major depressive disorder, recurrent severe without psychotic features: Secondary | ICD-10-CM | POA: Diagnosis not present

## 2024-09-26 DIAGNOSIS — F1021 Alcohol dependence, in remission: Secondary | ICD-10-CM | POA: Diagnosis not present

## 2024-09-27 NOTE — Telephone Encounter (Signed)
PA request was denied.

## 2024-09-28 NOTE — Telephone Encounter (Signed)
Appeal completed

## 2024-10-03 ENCOUNTER — Telehealth: Payer: Self-pay

## 2024-10-03 NOTE — Telephone Encounter (Signed)
 Patient's insurance company is requiring trial/failure or contraindication to at least 2 of the following medications Duloxetine, Pregabalin, or lidocaine 5% patches for appeal. They would also like clinical rationale.  I tried calling patient to let him know, but received no answer. I left a message for patient to call the office to discuss denial

## 2024-10-03 NOTE — Telephone Encounter (Signed)
 error

## 2024-10-04 DIAGNOSIS — G894 Chronic pain syndrome: Secondary | ICD-10-CM | POA: Diagnosis not present

## 2024-10-04 DIAGNOSIS — I10 Essential (primary) hypertension: Secondary | ICD-10-CM | POA: Diagnosis not present

## 2024-10-05 ENCOUNTER — Other Ambulatory Visit: Payer: Self-pay | Admitting: Internal Medicine

## 2024-10-05 DIAGNOSIS — I2510 Atherosclerotic heart disease of native coronary artery without angina pectoris: Secondary | ICD-10-CM

## 2024-10-05 DIAGNOSIS — E782 Mixed hyperlipidemia: Secondary | ICD-10-CM

## 2024-10-18 DIAGNOSIS — F429 Obsessive-compulsive disorder, unspecified: Secondary | ICD-10-CM | POA: Diagnosis not present

## 2024-10-18 DIAGNOSIS — I10 Essential (primary) hypertension: Secondary | ICD-10-CM | POA: Diagnosis not present

## 2024-10-18 DIAGNOSIS — F411 Generalized anxiety disorder: Secondary | ICD-10-CM | POA: Diagnosis not present

## 2024-10-18 DIAGNOSIS — F451 Undifferentiated somatoform disorder: Secondary | ICD-10-CM | POA: Diagnosis not present

## 2024-10-18 DIAGNOSIS — F410 Panic disorder [episodic paroxysmal anxiety] without agoraphobia: Secondary | ICD-10-CM | POA: Diagnosis not present

## 2024-10-19 ENCOUNTER — Ambulatory Visit: Payer: Self-pay

## 2024-10-19 NOTE — Telephone Encounter (Signed)
 FYI Only or Action Required?: Action required by provider: update on patient condition.  Patient was last seen in primary care on 06/19/2024 by Tobie Suzzane POUR, MD.  Called Nurse Triage reporting Generalized Body Aches.  Symptoms began several months ago.  Interventions attempted: Prescription medications: 3 diff muscle relaxers with no benefit, Rest, hydration, or home remedies, and Ice/heat application.  Symptoms are: gradually worsening.  Triage Disposition: See PCP When Office is Open (Within 3 Days)  Patient/caregiver understands and will follow disposition?: No, wishes to speak with PCP Copied from CRM #[id number]. Topic: Clinical - Red Word Triage >> Oct 19, 2024  9:56 AM Montie POUR wrote: Red Word that prompted transfer to Nurse Triage:  Graycen's pain all over his body has gotten worse. His wife, Devere, is on the phone and she was planning on making an appointment. Reason for Disposition  [1] MODERATE pain (e.g., interferes with normal activities) AND [2] present > 3 days  Answer Assessment - Initial Assessment Questions Last OV 05/2024- generalized body pain. Spasms, sometimes starts down at his toes all the way up his body. Ice at night sometimes helps calm it down. Has tried a bunch of different muscle relaxers from Dr Tobie but nothing helped. (Flexeril , baclofen, Robaxin ). Denies having tried Tramadol . Leg cream with no benefit   Looking to get lab work done to see if muscles are breaking down. Maybe needs an MRI to see what is causing. Maybe needs pain specialist. NCS with Neurologist came back negative about a year ago. Denies any tick bites. Since NOV is not until December- placed on Waitlist and reaching out to provider to see if pt can get lab work or any additional testing done prior to OV or if any further medications can be called in to try and ease his pain.  ED/UC precautions given and understood. Pharmacy confirmed. Please call with next steps. Appt Dec 2nd   1.  ONSET: When did the muscle aches or body pains start?      Worse over the last 3 months  2. LOCATION: What part of your body is hurting? (e.g., entire body, arms, legs)      everywhere 3. SEVERITY: How bad is the pain? (Scale 1-10; or mild, moderate, severe)     Fluctuating pain- lives at 3-4/10 and gets up to 8/10  4. CAUSE: What do you think is causing the pains?     Neurologist states not from his Parkinson's 5. FEVER: Do you have a fever? If Yes, ask: What is your temperature, how was it measured, and  when did it start?      Denies  6. OTHER SYMPTOMS: Do you have any other symptoms? (e.g., chest pain, cold or flu symptoms, rash, weakness, weight loss)     Denies  Protocols used: Muscle Aches and Body Pain-A-AH

## 2024-10-31 DIAGNOSIS — F331 Major depressive disorder, recurrent, moderate: Secondary | ICD-10-CM | POA: Diagnosis not present

## 2024-10-31 DIAGNOSIS — R413 Other amnesia: Secondary | ICD-10-CM | POA: Diagnosis not present

## 2024-10-31 DIAGNOSIS — F1021 Alcohol dependence, in remission: Secondary | ICD-10-CM | POA: Diagnosis not present

## 2024-10-31 DIAGNOSIS — F411 Generalized anxiety disorder: Secondary | ICD-10-CM | POA: Diagnosis not present

## 2024-11-15 DIAGNOSIS — I10 Essential (primary) hypertension: Secondary | ICD-10-CM | POA: Diagnosis not present

## 2024-11-15 DIAGNOSIS — G894 Chronic pain syndrome: Secondary | ICD-10-CM | POA: Diagnosis not present

## 2024-11-27 ENCOUNTER — Encounter: Payer: Self-pay | Admitting: Internal Medicine

## 2024-11-27 ENCOUNTER — Ambulatory Visit: Payer: Self-pay | Admitting: Internal Medicine

## 2024-11-27 VITALS — BP 103/72 | HR 68 | Ht 68.0 in | Wt 208.2 lb

## 2024-11-27 DIAGNOSIS — I10 Essential (primary) hypertension: Secondary | ICD-10-CM | POA: Diagnosis not present

## 2024-11-27 DIAGNOSIS — G2111 Neuroleptic induced parkinsonism: Secondary | ICD-10-CM | POA: Diagnosis not present

## 2024-11-27 DIAGNOSIS — G629 Polyneuropathy, unspecified: Secondary | ICD-10-CM | POA: Diagnosis not present

## 2024-11-27 DIAGNOSIS — M170 Bilateral primary osteoarthritis of knee: Secondary | ICD-10-CM | POA: Diagnosis not present

## 2024-11-27 DIAGNOSIS — N1831 Chronic kidney disease, stage 3a: Secondary | ICD-10-CM

## 2024-11-27 DIAGNOSIS — F331 Major depressive disorder, recurrent, moderate: Secondary | ICD-10-CM

## 2024-11-27 DIAGNOSIS — T43505A Adverse effect of unspecified antipsychotics and neuroleptics, initial encounter: Secondary | ICD-10-CM

## 2024-11-27 MED ORDER — DULOXETINE HCL 30 MG PO CPEP: 30.0000 mg | ORAL_CAPSULE | Freq: Every day | ORAL | 3 refills | Status: DC

## 2024-11-27 NOTE — Assessment & Plan Note (Signed)
 Stable stage 3a CKD, reviewed last BMP Avoid nephrotoxic agents Has been told to reduce free water intake due to hyponatremia On Lisinopril Followed by Nephrology

## 2024-11-27 NOTE — Assessment & Plan Note (Signed)
 Better controlled now On Luvox 100 mg QHS, Mirtazepine 15 mg qHS and Seroquel  200 mg qHS - followed by Psychiatry at [facility] Valium  10 mg every morning Also has Ambien  10 mg at bedtime for insomnia

## 2024-11-27 NOTE — Assessment & Plan Note (Addendum)
 Has chronic, bilateral knee pain - L > R His left leg pain is likely also contributed by OA of knee Check x-ray of left knee Tylenol  arthritis as needed for pain Unable to take oral NSAIDs due to CKD Advised to follow-up with orthopedic surgery at [facility] for discussion of TKA

## 2024-11-27 NOTE — Progress Notes (Signed)
 Established Patient Office Visit  Subjective:  Patient ID: Devin Daugherty, male    DOB: 15-May-1951  Age: 73 y.o. MRN: [id number]  CC:  Chief Complaint  Patient presents with   Pain    Reports sx of left leg pain.    HPI Devin Daugherty is a 73 y.o. male with past medical history of HTN, CAD s/p stent placement, HLD, depression with anxiety, CKD stage 3a, and medication induced parkinsonism who presents for f/u of his chronic medical conditions.  HTN: BP is well-controlled. Takes lisinopril 10 mg QD, amlodipine 5 mg QD, Coreg 6.25 mg BID and Imdur 60 mg QD regularly. Patient denies headache, chest pain, dyspnea or palpitations.  Of note, he was hospitalized in 05/25 with hypotension, but has not had hypotensive spells recently.  CAD: S/p stent placement, on Aspirin  and statin, followed by Cardiology.  Denies any chest pain currently.  Depression with anxiety: Followed by Psychiatry. He is on multiple medications. Denies any SI or HI.  He has had hospitalization for suicidal ideation/intentional drug overdose of carvedilol and lisinopril in 02/18.  He was admitted for inpatient psychiatry evaluation and is followed by psychiatrist at [facility].  He is placed on Seroquel , Luvox and Remeron .  He also takes Valium  10 mg QAM. His anxiety is better now, but still has spells of anxiety at times.  He still has apathy, insomnia and decreased concentration.  He reports bilateral foot burning sensation and intermittent tingling as well.  He had NCS and EMG, which were unremarkable.  He takes Lyrica 150 mg BID for neuropathic pain. He also reports bilateral leg cramps, which are chronic and intermittent.  He reports recent worsening of left leg pain.  He has chronic bilateral knee pain, he has been evaluated by orthopedic surgery, but he denies TKA. Of note, he has history of alcohol abuse and hyponatremia. He takes Sinemet for drug induced parkinsonism.  Past Medical History:  Diagnosis Date    Anxiety    CAD (coronary artery disease)    Depression    GERD (gastroesophageal reflux disease)    Hernia of abdominal cavity    High cholesterol    Hypertension    OCD (obsessive compulsive disorder)    Osteoarthritis    Pancreatic cyst    Parkinson disease (HCC)     Past Surgical History:  Procedure Laterality Date   COLONOSCOPY     CORONARY ANGIOPLASTY WITH STENT PLACEMENT  05/2018   Heart  Stent  2009   HERNIA REPAIR      Family History  Problem Relation Age of Onset   Heart attack Father    Hyperlipidemia Son     Social History   Socioeconomic History   Marital status: Married    Spouse name: Not on file   Number of children: 2   Years of education: 14   Highest education level: Associate degree: occupational, scientist, product/process development, or vocational program  Occupational History   Occupation: retired    Associate Professor: [employer]    Comment: welder  Tobacco Use   Smoking status: Never   Smokeless tobacco: Never  Vaping Use   Vaping status: Never Used  Substance and Sexual Activity   Alcohol use: No   Drug use: No   Sexual activity: Yes    Partners: Female    Birth control/protection: None    Comment: married  Other Topics Concern   Not on file  Social History Narrative   Married, lives at home with spouse. Multiple  children and grandchildren. 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 Active in local church. Likes to travel with spouse to the beach.    Social Drivers of Corporate Investment Banker Strain: Low Risk (05/02/2024)   Received from [facility]   Overall Financial Resource Strain (CARDIA)    Difficulty of Paying Living Expenses: Not hard at all  Food Insecurity: No Food Insecurity (05/07/2024)   Hunger Vital Sign    Worried About Running Out of Food in the Last Year: Never true    Ran Out of Food in the Last Year: Never true  Transportation Needs: No Transportation Needs (05/07/2024)   PRAPARE - Administrator, Civil Service  (Medical): No    Lack of Transportation (Non-Medical): No  Physical Activity: Inactive (05/02/2024)   Received from [facility]   Exercise Vital Sign    On average, how many days per week do you engage in moderate to strenuous exercise (like a brisk walk)?: 0 days    On average, how many minutes do you engage in exercise at this level?: 0 min  Stress: No Stress Concern Present (05/02/2024)   Received from [facility] - Occupational Stress Questionnaire    Feeling of Stress : Not at all  Recent Concern: Stress - Stress Concern Present (04/10/2024)   Received from [facility] - Occupational Stress Questionnaire    Feeling of Stress : Rather much  Social Connections: Socially Integrated (05/02/2024)   Received from [facility]   Social Connection and Isolation Panel    In a typical week, how many times do you talk on the phone with family, friends, or neighbors?: More than three times a week    How often do you get together with friends or relatives?: More than three times a week    How often do you attend church or religious services?: More than 4 times per year    Do you belong to any clubs or organizations such as church groups, unions, fraternal or athletic groups, or school groups?: Yes    How often do you attend meetings of the clubs or organizations you belong to?: More than 4 times per year    Are you married, widowed, divorced, separated, never married, or living with a partner?: Married  Intimate Partner Violence: Not At Risk (05/07/2024)   Humiliation, Afraid, Rape, and Kick questionnaire    Fear of Current or Ex-Partner: No    Emotionally Abused: No    Physically Abused: No    Sexually Abused: No    Outpatient Medications Prior to Visit  Medication Sig Dispense Refill   amLODipine (NORVASC) 5 MG tablet Take 5 mg by mouth daily.     ASPIRIN  LOW DOSE 81 MG tablet Take 81 mg by mouth  daily.     baclofen (LIORESAL) 10 MG tablet Take 10 mg by mouth 3 (three) times daily as needed for muscle spasms.     carbidopa-levodopa (SINEMET IR) 25-100 MG tablet Take 1 tablet by mouth 3 (three) times daily.     carvedilol (COREG) 6.25 MG tablet Take 6.25 mg by mouth 2 (two) times daily.     CONSTULOSE 10 GM/15ML solution Take 20 g by mouth daily as needed for mild constipation.     cyclobenzaprine  (FLEXERIL ) 5 MG tablet TAKE 1 TABLET BY MOUTH TWICE DAILY AS NEEDED FOR MUSCLE SPASMS 30 tablet  1   diazepam  (VALIUM ) 10 MG tablet Take 10 mg by mouth in the morning.     fluvoxaMINE (LUVOX) 100 MG tablet Take 100 mg by mouth at bedtime.     isosorbide mononitrate (IMDUR) 60 MG 24 hr tablet Take 60 mg by mouth daily.     lisinopril (ZESTRIL) 10 MG tablet Take 10 mg by mouth daily.     mirtazapine  (REMERON ) 15 MG tablet Take 15 mg by mouth at bedtime.     pregabalin (LYRICA) 75 MG capsule Take 150 mg by mouth 2 (two) times daily.     QUEtiapine  (SEROQUEL ) 200 MG tablet Take 200 mg by mouth at bedtime.     rosuvastatin  (CRESTOR ) 40 MG tablet TAKE 1 TABLET BY MOUTH DAILY 90 tablet 3   zolpidem  (AMBIEN ) 10 MG tablet Take 1 tablet (10 mg total) by mouth at bedtime as needed for sleep. (Patient taking differently: Take 10 mg by mouth at bedtime.) 30 tablet 0   fluvoxaMINE (LUVOX) 50 MG tablet Take 50 mg by mouth in the morning.     gabapentin  (NEURONTIN ) 300 MG capsule Take 300 mg by mouth 3 (three) times daily.     hydrOXYzine (VISTARIL) 25 MG capsule Take 50 mg by mouth 2 (two) times daily.     methocarbamol  (ROBAXIN ) 500 MG tablet Take 1 tablet (500 mg total) by mouth 2 (two) times daily. 20 tablet 0   No facility-administered medications prior to visit.    Allergies  Allergen Reactions   Tizanidine Nausea And Vomiting   Codeine Itching    ROS Review of Systems  Constitutional:  Positive for fatigue. Negative for chills, fever and unexpected weight change.  HENT:  Negative for sinus  pressure and sinus pain.   Eyes:  Negative for pain and discharge.  Respiratory:  Negative for cough and shortness of breath.   Cardiovascular:  Negative for chest pain and palpitations.  Gastrointestinal:  Positive for constipation. Negative for diarrhea, nausea and vomiting.  Genitourinary:  Negative for dysuria and hematuria.  Musculoskeletal:  Positive for arthralgias and back pain. Negative for neck pain and neck stiffness.  Skin:  Negative for rash.  Neurological:  Positive for tremors and numbness (B/l feet). Negative for dizziness and seizures.  Psychiatric/Behavioral:  Positive for decreased concentration, dysphoric mood and sleep disturbance. Negative for agitation and behavioral problems. The patient is nervous/anxious.       Objective:    Physical Exam Vitals reviewed.  Constitutional:      General: He is not in acute distress.    Appearance: He is not diaphoretic.  HENT:     Head: Normocephalic and atraumatic.     Nose: Nose normal.     Mouth/Throat:     Mouth: Mucous membranes are moist.  Eyes:     General: No scleral icterus.    Extraocular Movements: Extraocular movements intact.  Cardiovascular:     Rate and Rhythm: Normal rate and regular rhythm.     Heart sounds: Normal heart sounds. No murmur heard. Pulmonary:     Breath sounds: Normal breath sounds. No wheezing or rales.  Abdominal:     General: Bowel sounds are normal.     Palpations: Abdomen is soft.     Tenderness: There is no abdominal tenderness.  Musculoskeletal:     Cervical back: Neck supple. No tenderness.     Thoracic back: Tenderness present.     Lumbar back: Tenderness present. Decreased range of motion.     Left knee: Swelling  present. Normal range of motion. Tenderness present.     Right lower leg: No edema.     Left lower leg: No edema.  Feet:     Left foot:     Skin integrity: Callus (Between second and third toe) present.  Skin:    General: Skin is warm.     Findings: No rash.   Neurological:     General: No focal deficit present.     Mental Status: He is alert and oriented to person, place, and time.     Motor: Tremor (b/l UE) present.  Psychiatric:        Mood and Affect: Mood is anxious.        Behavior: Behavior normal.     BP 103/72   Pulse 68   Ht 5' 8 (1.727 m)   Wt 208 lb 3.2 oz (94.4 kg)   SpO2 97%   BMI 31.66 kg/m  Wt Readings from Last 3 Encounters:  11/27/24 208 lb 3.2 oz (94.4 kg)  08/30/24 198 lb 6.6 oz (90 kg)  07/13/24 196 lb 9.6 oz (89.2 kg)    Lab Results  Component Value Date   TSH 0.695 06/19/2024   Lab Results  Component Value Date   WBC 8.5 08/30/2024   HGB 14.5 08/30/2024   HCT 41.9 08/30/2024   MCV 84.8 08/30/2024   PLT 274 08/30/2024   Lab Results  Component Value Date   NA 135 08/30/2024   K 3.5 08/30/2024   CO2 21 (L) 08/30/2024   GLUCOSE 100 (H) 08/30/2024   BUN 18 08/30/2024   CREATININE 1.24 08/30/2024   BILITOT 0.8 08/30/2024   ALKPHOS 87 08/30/2024   AST 20 08/30/2024   ALT 21 08/30/2024   PROT 8.2 (H) 08/30/2024   ALBUMIN 4.5 08/30/2024   CALCIUM  9.3 08/30/2024   ANIONGAP 11 08/30/2024   EGFR 59 (L) 06/19/2024   Lab Results  Component Value Date   CHOL 129 06/19/2024   Lab Results  Component Value Date   HDL 42 06/19/2024   Lab Results  Component Value Date   LDLCALC 58 06/19/2024   Lab Results  Component Value Date   TRIG 172 (H) 06/19/2024   Lab Results  Component Value Date   CHOLHDL 3.1 06/19/2024   Lab Results  Component Value Date   HGBA1C 6.1 (H) 06/19/2024      Assessment & Plan:   Problem List Items Addressed This Visit       Cardiovascular and Mediastinum   Hypertension (Chronic)   BP Readings from Last 1 Encounters:  11/27/24 103/72   Well-controlled with Lisinopril 10 mg QD, Amlodipine 5 mg QD, Imdur 60 mg QD, Coreg 6.25 mg QD Followed by cardiology Counseled for compliance with the medications Advised DASH diet and moderate exercise/walking, at least  150 mins/week      Relevant Medications   isosorbide mononitrate (IMDUR) 60 MG 24 hr tablet     Nervous and Auditory   Neuroleptic-induced parkinsonism   Tremors likely due to antipsychotic On Sinemet currently Neuropathic pain, leg cramps and constipation are likely due to parkinsonism He is placed on Seroquel , followed by psychiatry and neurology      Small fiber neuropathy   Has chronic bilateral leg pain, likely due to peripheral neuropathy Followed by neurology-recently increased dose of Lyrica to 150 mg twice daily Tried to add Cymbalta, but discontinued due to risk of serotonin syndrome with fluvoxamine (CYP2A1 inhibitor)      Relevant Medications  pregabalin (LYRICA) 75 MG capsule   mirtazapine  (REMERON ) 15 MG tablet   diazepam  (VALIUM ) 10 MG tablet     Musculoskeletal and Integument   Primary osteoarthritis of both knees - Primary   Has chronic, bilateral knee pain - L > R His left leg pain is likely also contributed by OA of knee Check x-ray of left knee Tylenol  arthritis as needed for pain Added Naproxen for short-term for knee pain Advised to follow-up with orthopedic surgery at [facility] for discussion of TKA      Relevant Medications   naproxen (NAPROSYN) 500 MG tablet   Other Relevant Orders   DG Knee Complete 4 Views Left     Genitourinary   Stage 3 chronic kidney disease (HCC)   Stable stage 3a CKD, reviewed last BMP Avoid nephrotoxic agents Has been told to reduce free water intake due to hyponatremia On Lisinopril Followed by Nephrology        Other   MDD (major depressive disorder), recurrent, moderate episode (Chronic)   Better controlled now On Luvox 100 mg QHS, Mirtazepine 15 mg qHS and Seroquel  200 mg qHS - followed by Psychiatry at [facility] Takes Valium  10 mg every morning Also has Ambien  10 mg at bedtime for insomnia      Relevant Medications   mirtazapine  (REMERON ) 15 MG tablet   diazepam  (VALIUM ) 10 MG tablet       Meds ordered this encounter  Medications   DISCONTD: DULoxetine (CYMBALTA) 30 MG capsule    Sig: Take 1 capsule (30 mg total) by mouth daily.    Dispense:  30 capsule    Refill:  3   naproxen (NAPROSYN) 500 MG tablet    Sig: Take 1 tablet (500 mg total) by mouth daily as needed for mild pain (pain score 1-3) or moderate pain (pain score 4-6).    Dispense:  30 tablet    Refill:  1    Follow-up: Return in about 4 months (around 03/28/2025).    Suzzane MARLA Blanch, MD

## 2024-11-27 NOTE — Patient Instructions (Addendum)
 Please start taking Cymbalta 30 mg once daily.  Please get X-ray of left knee at [facility].  Please continue to take medications as prescribed.  Please continue to follow low carb diet and ambulate as tolerated.

## 2024-11-27 NOTE — Assessment & Plan Note (Signed)
 BP Readings from Last 1 Encounters:  11/27/24 103/72   Well-controlled with Lisinopril 10 mg QD, Amlodipine 5 mg QD, Imdur 60 mg QD, Coreg 6.25 mg QD Followed by cardiology Counseled for compliance with the medications Advised DASH diet and moderate exercise/walking, at least 150 mins/week

## 2024-11-27 NOTE — Assessment & Plan Note (Signed)
 Tremors likely due to antipsychotic On Sinemet currently Neuropathic pain, leg cramps and constipation are likely due to parkinsonism He is placed on Seroquel , followed by psychiatry and neurology

## 2024-11-27 NOTE — Assessment & Plan Note (Signed)
 Has chronic bilateral leg pain, likely due to peripheral neuropathy Followed by neurology-recently increased dose of Lyrica to 150 mg twice daily Tried to add Cymbalta, but discontinued due to risk of serotonin syndrome with fluvoxamine (CYP2A1 inhibitor)

## 2024-11-28 ENCOUNTER — Telehealth: Payer: Self-pay

## 2024-11-28 MED ORDER — NAPROXEN 500 MG PO TABS: 500.0000 mg | ORAL_TABLET | Freq: Every day | ORAL | 1 refills | Status: AC | PRN

## 2024-11-28 NOTE — Telephone Encounter (Signed)
 Copied from CRM #[id number]. Topic: Clinical - Prescription Issue >> Nov 28, 2024  7:51 AM Rosaria BRAVO wrote: Reason for CRM: Debbie from the pharmacy called to report that there is a drug interaction between duloxetine and fluvoxaMINE (LUVOX) 100 MG tablet Please call so the pharmacy can know what to do.  [facility] [place], [place] - [address] [place] [address] Phone: [phone] Fax: [phone]

## 2024-11-28 NOTE — Addendum Note (Signed)
 Addended byBETHA TOBIE DOWNS on: 11/28/2024 12:09 PM   Modules accepted: Orders

## 2024-12-03 ENCOUNTER — Other Ambulatory Visit: Payer: Self-pay | Admitting: Internal Medicine

## 2024-12-03 ENCOUNTER — Ambulatory Visit: Payer: Medicare Other

## 2024-12-03 VITALS — Ht 68.0 in | Wt 205.0 lb

## 2024-12-03 DIAGNOSIS — Z0000 Encounter for general adult medical examination without abnormal findings: Secondary | ICD-10-CM

## 2024-12-03 DIAGNOSIS — G629 Polyneuropathy, unspecified: Secondary | ICD-10-CM

## 2024-12-03 DIAGNOSIS — M545 Low back pain, unspecified: Secondary | ICD-10-CM

## 2024-12-03 NOTE — Patient Instructions (Signed)
 Devin Daugherty,  Thank you for taking the time for your Medicare Wellness Visit. I appreciate your continued commitment to your health goals. Please review the care plan we discussed, and feel free to reach out if I can assist you further.  Please note that Annual Wellness Visits do not include a physical exam. Some assessments may be limited, especially if the visit was conducted virtually. If needed, we may recommend an in-person follow-up with your provider.  Ongoing Care Seeing your primary care provider every 3 to 6 months helps us  monitor your health and provide consistent, personalized care.   Follow up with Dr. Tobie: April 01, 2025 at 1:20 pm  1 year follow up with Medicare Wellness Nurse: December 04, 2025 at 10:40 am in office  Referrals If a referral was made during today's visit and you haven't received any updates within two weeks, please contact the referred provider directly to check on the status.  I will send Dr. Tobie a message letting him know you'd like that referral to pain management Recommended Screenings:  Health Maintenance  Topic Date Due   Zoster (Shingles) Vaccine (1 of 2) Never done   COVID-19 Vaccine (3 - Pfizer risk series) 03/06/2020   Medicare Annual Wellness Visit  11/28/2024   Flu Shot  03/26/2025*   DTaP/Tdap/Td vaccine (3 - Td or Tdap) 06/16/2032   Colon Cancer Screening  02/28/2034   Pneumococcal Vaccine for age over 48  Completed   Hepatitis C Screening  Completed   Meningitis B Vaccine  Aged Out  *Topic was postponed. The date shown is not the original due date.       12/03/2024   10:18 AM  Advanced Directives  Does Patient Have a Medical Advance Directive? No  Would patient like information on creating a medical advance directive? Yes (MAU/Ambulatory/Procedural Areas - Information given)    Vision: Annual vision screenings are recommended for early detection of glaucoma, cataracts, and diabetic retinopathy. These exams can also reveal signs  of chronic conditions such as diabetes and high blood pressure.  Dental: Annual dental screenings help detect early signs of oral cancer, gum disease, and other conditions linked to overall health, including heart disease and diabetes.  Please see the attached documents for additional preventive care recommendations.

## 2024-12-03 NOTE — Progress Notes (Signed)
 Chief Complaint  Patient presents with   Medicare Wellness     Subjective:   Devin Daugherty is a 73 y.o. male who presents for a Medicare Annual Wellness Visit.  Visit info / Clinical Intake: Medicare Wellness Visit Type:: Subsequent Annual Wellness Visit Persons participating in visit and providing information:: patient Medicare Wellness Visit Mode:: Video Since this visit was completed virtually, some vitals may be partially provided or unavailable. Missing vitals are due to the limitations of the virtual format.: Documented vitals are patient reported If Telephone or Video please confirm:: I connected with patient using audio/video enable telemedicine. I verified patient identity with two identifiers, discussed telehealth limitations, and patient agreed to proceed. Patient Location:: home Provider Location:: office Interpreter Needed?: No Pre-visit prep was completed: yes AWV questionnaire completed by patient prior to visit?: no Living arrangements:: lives with spouse/significant other Patient's Overall Health Status Rating: (!) fair Typical amount of pain: some Does pain affect daily life?: (!) yes Are you currently prescribed opioids?: no  Dietary Habits and Nutritional Risks How many meals a day?: 2 Eats fruit and vegetables daily?: yes Most meals are obtained by: preparing own meals; having others provide food In the last 2 weeks, have you had any of the following?: none Diabetic:: no  Functional Status Activities of Daily Living (to include ambulation/medication): Independent Ambulation: Independent Medication Administration: Independent Home Management (perform basic housework or laundry): Independent Manage your own finances?: yes Primary transportation is: driving Concerns about vision?: no *vision screening is required for WTM* Concerns about hearing?: no  Fall Screening Falls in the past year?: 1 Number of falls in past year: 0 Was there an injury  with Fall?: 0 Fall Risk Category Calculator: 1 Patient Fall Risk Level: Low Fall Risk  Fall Risk Patient at Risk for Falls Due to: History of fall(s) Fall risk Follow up: Falls evaluation completed; Education provided; Falls prevention discussed  Home and Transportation Safety: All rugs have non-skid backing?: (!) no All stairs or steps have railings?: N/A, no stairs Grab bars in the bathtub or shower?: yes Have non-skid surface in bathtub or shower?: (!) no Good home lighting?: yes Regular seat belt use?: yes Hospital stays in the last year:: no  Cognitive Assessment Difficulty concentrating, remembering, or making decisions? : no Will 6CIT or Mini Cog be Completed: no 6CIT or Mini Cog Declined: patient alert, oriented, able to answer questions appropriately and recall recent events  Advance Directives (For Healthcare) Does Patient Have a Medical Advance Directive?: No Does patient want to make changes to medical advance directive?: No - Patient declined Type of Advance Directive: Healthcare Power of Attorney Copy of Healthcare Power of Attorney in Chart?: No - copy requested Would patient like information on creating a medical advance directive?: Yes (MAU/Ambulatory/Procedural Areas - Information given)  Reviewed/Updated  Reviewed/Updated: Reviewed All (Medical, Surgical, Family, Medications, Allergies, Care Teams, Patient Goals)    Allergies (verified) Tizanidine and Codeine   Current Medications (verified) Outpatient Encounter Medications as of 12/03/2024  Medication Sig   amLODipine (NORVASC) 5 MG tablet Take 5 mg by mouth daily.   ASPIRIN  LOW DOSE 81 MG tablet Take 81 mg by mouth daily.   baclofen (LIORESAL) 10 MG tablet Take 10 mg by mouth 3 (three) times daily as needed for muscle spasms.   carbidopa-levodopa (SINEMET IR) 25-100 MG tablet Take 1 tablet by mouth 3 (three) times daily.   carvedilol (COREG) 6.25 MG tablet Take 6.25 mg by mouth 2 (two) times daily.  CONSTULOSE 10 GM/15ML solution Take 20 g by mouth daily as needed for mild constipation.   cyclobenzaprine  (FLEXERIL ) 5 MG tablet TAKE 1 TABLET BY MOUTH TWICE DAILY AS NEEDED FOR MUSCLE SPASMS   diazepam  (VALIUM ) 10 MG tablet Take 10 mg by mouth in the morning.   fluvoxaMINE (LUVOX) 100 MG tablet Take 100 mg by mouth at bedtime.   isosorbide mononitrate (IMDUR) 60 MG 24 hr tablet Take 60 mg by mouth daily.   lisinopril (ZESTRIL) 10 MG tablet Take 10 mg by mouth daily.   mirtazapine  (REMERON ) 15 MG tablet Take 15 mg by mouth at bedtime.   naproxen  (NAPROSYN ) 500 MG tablet Take 1 tablet (500 mg total) by mouth daily as needed for mild pain (pain score 1-3) or moderate pain (pain score 4-6).   pregabalin (LYRICA) 75 MG capsule Take 150 mg by mouth 2 (two) times daily.   QUEtiapine  (SEROQUEL ) 200 MG tablet Take 200 mg by mouth at bedtime.   rosuvastatin  (CRESTOR ) 40 MG tablet TAKE 1 TABLET BY MOUTH DAILY   zolpidem  (AMBIEN ) 10 MG tablet Take 1 tablet (10 mg total) by mouth at bedtime as needed for sleep. (Patient taking differently: Take 10 mg by mouth at bedtime.)   No facility-administered encounter medications on file as of 12/03/2024.    History: Past Medical History:  Diagnosis Date   Anxiety    CAD (coronary artery disease)    Depression    GERD (gastroesophageal reflux disease)    Hernia of abdominal cavity    High cholesterol    Hypertension    OCD (obsessive compulsive disorder)    Osteoarthritis    Pancreatic cyst    Parkinson disease (HCC)    Past Surgical History:  Procedure Laterality Date   COLONOSCOPY     CORONARY ANGIOPLASTY WITH STENT PLACEMENT  05/2018   Heart  Stent  2009   HERNIA REPAIR     Family History  Problem Relation Age of Onset   Heart attack Father    Hyperlipidemia Son    Social History   Occupational History   Occupation: retired    Associate Professor: [employer]    Comment: welder  Tobacco Use   Smoking status: Never   Smokeless tobacco: Never   Vaping Use   Vaping status: Never Used  Substance and Sexual Activity   Alcohol use: No   Drug use: No   Sexual activity: Yes    Partners: Female    Birth control/protection: None    Comment: married   Tobacco Counseling Counseling given: Yes  SDOH Screenings   Food Insecurity: No Food Insecurity (12/03/2024)  Housing: Low Risk  (12/03/2024)  Transportation Needs: No Transportation Needs (12/03/2024)  Utilities: Not At Risk (12/03/2024)  Alcohol Screen: Low Risk  (11/29/2023)  Depression (PHQ2-9): High Risk (12/03/2024)  Financial Resource Strain: Low Risk (05/02/2024)   Received from [facility]  Physical Activity: Inactive (12/03/2024)  Social Connections: Moderately Isolated (12/03/2024)  Stress: Stress Concern Present (12/03/2024)  Tobacco Use: Low Risk  (12/03/2024)  Health Literacy: Adequate Health Literacy (12/03/2024)   See flowsheets for full screening details  Depression Screen PHQ 2 & 9 Depression Scale- Over the past 2 weeks, how often have you been bothered by any of the following problems? Little interest or pleasure in doing things: 3 Feeling down, depressed, or hopeless (PHQ Adolescent also includes...irritable): 0 PHQ-2 Total Score: 3 Trouble falling or staying asleep, or sleeping too much: 3 Feeling tired or having little energy: 3 Poor appetite  or overeating (PHQ Adolescent also includes...weight loss): 0 Feeling bad about yourself - or that you are a failure or have let yourself or your family down: 3 Trouble concentrating on things, such as reading the newspaper or watching television (PHQ Adolescent also includes...like school work): 0 Moving or speaking so slowly that other people could have noticed. Or the opposite - being so fidgety or restless that you have been moving around a lot more than usual: 1 Thoughts that you would be better off dead, or of hurting yourself in some way: 0 PHQ-9 Total Score: 13 If you checked off any problems, how difficult have  these problems made it for you to do your work, take care of things at home, or get along with other people?: Somewhat difficult  Depression Treatment Depression Interventions/Treatment : Currently on Treatment     Goals Addressed               This Visit's Progress     Decrease muscle pain (pt-stated)               Objective:    Today's Vitals   12/03/24 1016  Weight: 205 lb (93 kg)  Height: 5' 8 (1.727 m)   Body mass index is 31.17 kg/m.  Hearing/Vision screen Hearing Screening - Comments:: Patient denies any hearing difficulties.   Vision Screening - Comments:: Wears rx glasses - up to date with routine eye exams with  [facility] location Immunizations and Health Maintenance Health Maintenance  Topic Date Due   Zoster Vaccines- Shingrix (1 of 2) Never done   COVID-19 Vaccine (3 - Pfizer risk series) 03/06/2020   Medicare Annual Wellness (AWV)  11/28/2024   Influenza Vaccine  03/26/2025 (Originally 07/27/2024)   DTaP/Tdap/Td (3 - Td or Tdap) 06/16/2032   Colonoscopy  02/28/2034   Pneumococcal Vaccine: 50+ Years  Completed   Hepatitis C Screening  Completed   Meningococcal B Vaccine  Aged Out        Assessment/Plan:  This is a routine wellness examination for [place]on.  Patient Care Team: Tobie Suzzane POUR, MD as PCP - General (Internal Medicine) [facility] , Roni Kyung Senior, MD as Referring Physician (Neurology) Gracia Merck, MD as Referring Physician (Psychiatry) Marcum, Zada Mering, LCSW (Licensed Clinical Social Worker) Gershon, Donnice SAUNDERS, DPM as Consulting Physician (Podiatry) Felts, Lamarr DEL, FNP (Cardiology) Jaynie Almarie LITTIE DEVONNA (Gastroenterology) Tamea Kappa, MD as Referring Physician (Gastroenterology) Lindaann Dunnings, MD as Referring Physician (Gastroenterology)  I have personally reviewed and noted the following in the patient's chart:   Medical and social history Use of alcohol, tobacco or  illicit drugs  Current medications and supplements including opioid prescriptions. Functional ability and status Nutritional status Physical activity Advanced directives List of other physicians Hospitalizations, surgeries, and ER visits in previous 12 months Vitals Screenings to include cognitive, depression, and falls Referrals and appointments  No orders of the defined types were placed in this encounter.  In addition, I have reviewed and discussed with patient certain preventive protocols, quality metrics, and best practice recommendations. A written personalized care plan for preventive services as well as general preventive health recommendations were provided to patient.   Analucia Hush, CMA   12/03/2024   No follow-ups on file.  After Visit Summary: (MyChart) Due to this being a telephonic visit, the after visit summary with patients personalized plan was offered to patient via MyChart   Nurse Notes: requesting a referral to pain management as discussed at last  office visit.

## 2024-12-06 DIAGNOSIS — F1021 Alcohol dependence, in remission: Secondary | ICD-10-CM | POA: Diagnosis not present

## 2024-12-06 DIAGNOSIS — F5105 Insomnia due to other mental disorder: Secondary | ICD-10-CM | POA: Diagnosis not present

## 2024-12-06 DIAGNOSIS — R413 Other amnesia: Secondary | ICD-10-CM | POA: Diagnosis not present

## 2024-12-06 DIAGNOSIS — F331 Major depressive disorder, recurrent, moderate: Secondary | ICD-10-CM | POA: Diagnosis not present

## 2024-12-06 DIAGNOSIS — F411 Generalized anxiety disorder: Secondary | ICD-10-CM | POA: Diagnosis not present

## 2024-12-06 DIAGNOSIS — I10 Essential (primary) hypertension: Secondary | ICD-10-CM | POA: Diagnosis not present

## 2024-12-06 DIAGNOSIS — F410 Panic disorder [episodic paroxysmal anxiety] without agoraphobia: Secondary | ICD-10-CM | POA: Diagnosis not present

## 2024-12-25 ENCOUNTER — Ambulatory Visit: Admitting: Internal Medicine

## 2025-04-01 ENCOUNTER — Ambulatory Visit: Admitting: Internal Medicine

## 2025-12-04 ENCOUNTER — Ambulatory Visit
